# Patient Record
Sex: Female | Born: 1945 | Race: White | Hispanic: No | Marital: Single | Smoking: Never smoker
Health system: Southern US, Community
[De-identification: ages and names within clinical notes are randomized; demographics above are authoritative.]

## PROBLEM LIST (undated history)

## (undated) DIAGNOSIS — Z8042 Family history of malignant neoplasm of prostate: Secondary | ICD-10-CM

## (undated) DIAGNOSIS — F329 Major depressive disorder, single episode, unspecified: Secondary | ICD-10-CM

## (undated) DIAGNOSIS — Z8719 Personal history of other diseases of the digestive system: Secondary | ICD-10-CM

## (undated) DIAGNOSIS — E079 Disorder of thyroid, unspecified: Secondary | ICD-10-CM

## (undated) DIAGNOSIS — R9431 Abnormal electrocardiogram [ECG] [EKG]: Secondary | ICD-10-CM

## (undated) DIAGNOSIS — Z86010 Personal history of colonic polyps: Secondary | ICD-10-CM

## (undated) DIAGNOSIS — K862 Cyst of pancreas: Secondary | ICD-10-CM

## (undated) DIAGNOSIS — K219 Gastro-esophageal reflux disease without esophagitis: Secondary | ICD-10-CM

## (undated) DIAGNOSIS — I10 Essential (primary) hypertension: Secondary | ICD-10-CM

## (undated) DIAGNOSIS — C50919 Malignant neoplasm of unspecified site of unspecified female breast: Secondary | ICD-10-CM

## (undated) DIAGNOSIS — K5731 Diverticulosis of large intestine without perforation or abscess with bleeding: Secondary | ICD-10-CM

## (undated) DIAGNOSIS — F32A Depression, unspecified: Secondary | ICD-10-CM

## (undated) DIAGNOSIS — Z800 Family history of malignant neoplasm of digestive organs: Secondary | ICD-10-CM

## (undated) DIAGNOSIS — Z801 Family history of malignant neoplasm of trachea, bronchus and lung: Secondary | ICD-10-CM

## (undated) DIAGNOSIS — E039 Hypothyroidism, unspecified: Secondary | ICD-10-CM

## (undated) DIAGNOSIS — E7800 Pure hypercholesterolemia, unspecified: Secondary | ICD-10-CM

## (undated) HISTORY — DX: Family history of malignant neoplasm of digestive organs: Z80.0

## (undated) HISTORY — DX: Gastro-esophageal reflux disease without esophagitis: K21.9

## (undated) HISTORY — DX: Depression, unspecified: F32.A

## (undated) HISTORY — PX: OTHER SURGICAL HISTORY: SHX169

## (undated) HISTORY — DX: Diverticulosis of large intestine without perforation or abscess with bleeding: K57.31

## (undated) HISTORY — DX: Malignant neoplasm of unspecified site of unspecified female breast: C50.919

## (undated) HISTORY — DX: Cyst of pancreas: K86.2

## (undated) HISTORY — PX: LIVER BIOPSY: SHX301

## (undated) HISTORY — DX: Family history of malignant neoplasm of prostate: Z80.42

## (undated) HISTORY — DX: Family history of malignant neoplasm of trachea, bronchus and lung: Z80.1

## (undated) HISTORY — DX: Major depressive disorder, single episode, unspecified: F32.9

## (undated) HISTORY — DX: Personal history of colonic polyps: Z86.010

---

## 1980-12-29 HISTORY — PX: THYROIDECTOMY: SHX17

## 2000-12-29 HISTORY — PX: ABDOMINAL HYSTERECTOMY: SHX81

## 2014-12-29 DIAGNOSIS — K5731 Diverticulosis of large intestine without perforation or abscess with bleeding: Secondary | ICD-10-CM

## 2014-12-29 DIAGNOSIS — Z860100 Personal history of colon polyps, unspecified: Secondary | ICD-10-CM

## 2014-12-29 HISTORY — DX: Diverticulosis of large intestine without perforation or abscess with bleeding: K57.31

## 2014-12-29 HISTORY — DX: Personal history of colonic polyps: Z86.010

## 2014-12-29 HISTORY — DX: Personal history of colon polyps, unspecified: Z86.0100

## 2015-07-06 ENCOUNTER — Encounter (HOSPITAL_COMMUNITY): Payer: Self-pay | Admitting: Emergency Medicine

## 2015-07-06 ENCOUNTER — Emergency Department (HOSPITAL_COMMUNITY)
Admission: EM | Admit: 2015-07-06 | Discharge: 2015-07-06 | Disposition: A | Discharge status: Home / Self Care | Payer: No Typology Code available for payment source | Attending: Emergency Medicine | Admitting: Emergency Medicine

## 2015-07-06 DIAGNOSIS — I10 Essential (primary) hypertension: Secondary | ICD-10-CM | POA: Insufficient documentation

## 2015-07-06 DIAGNOSIS — Y998 Other external cause status: Secondary | ICD-10-CM | POA: Diagnosis not present

## 2015-07-06 DIAGNOSIS — Y92410 Unspecified street and highway as the place of occurrence of the external cause: Secondary | ICD-10-CM | POA: Diagnosis not present

## 2015-07-06 DIAGNOSIS — Z8639 Personal history of other endocrine, nutritional and metabolic disease: Secondary | ICD-10-CM | POA: Insufficient documentation

## 2015-07-06 DIAGNOSIS — S60519A Abrasion of unspecified hand, initial encounter: Secondary | ICD-10-CM | POA: Insufficient documentation

## 2015-07-06 DIAGNOSIS — Y9389 Activity, other specified: Secondary | ICD-10-CM | POA: Insufficient documentation

## 2015-07-06 DIAGNOSIS — T148XXA Other injury of unspecified body region, initial encounter: Secondary | ICD-10-CM

## 2015-07-06 DIAGNOSIS — S6990XA Unspecified injury of unspecified wrist, hand and finger(s), initial encounter: Secondary | ICD-10-CM | POA: Diagnosis present

## 2015-07-06 HISTORY — DX: Disorder of thyroid, unspecified: E07.9

## 2015-07-06 HISTORY — DX: Essential (primary) hypertension: I10

## 2015-07-06 HISTORY — DX: Pure hypercholesterolemia, unspecified: E78.00
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
## 2015-07-06 NOTE — ED Notes (Signed)
Given glass of water
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
## 2015-07-06 NOTE — ED Notes (Signed)
C-Collar removed by Dr. Reather Converse

## 2015-09-07 ENCOUNTER — Encounter: Payer: Self-pay | Admitting: Family Medicine

## 2015-09-07 ENCOUNTER — Ambulatory Visit (INDEPENDENT_AMBULATORY_CARE_PROVIDER_SITE_OTHER): Payer: Medicare Other | Admitting: Family Medicine

## 2015-09-07 VITALS — BP 153/84 | HR 87 | Temp 97.0°F | Ht 66.0 in | Wt 163.8 lb

## 2015-09-07 DIAGNOSIS — E042 Nontoxic multinodular goiter: Secondary | ICD-10-CM | POA: Diagnosis not present

## 2015-09-07 DIAGNOSIS — R079 Chest pain, unspecified: Secondary | ICD-10-CM | POA: Diagnosis not present

## 2015-09-07 DIAGNOSIS — I10 Essential (primary) hypertension: Secondary | ICD-10-CM | POA: Diagnosis not present

## 2015-09-07 DIAGNOSIS — E785 Hyperlipidemia, unspecified: Secondary | ICD-10-CM | POA: Diagnosis not present
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
## 2015-09-07 NOTE — Patient Instructions (Signed)
Great to meet you!  We will get you set up with Cardiology   Chest Pain (Nonspecific) It is often hard to give a specific diagnosis for the cause of chest pain. There is always a chance that your pain could be related to something serious, such as a heart attack or a blood clot in the lungs. You need to follow up with your health care provider for further evaluation. CAUSES   Heartburn.  Pneumonia or bronchitis.  Anxiety or stress.  Inflammation around your heart (pericarditis) or lung (pleuritis or pleurisy).  A blood clot in the lung.  A collapsed lung (pneumothorax). It can develop suddenly on its own (spontaneous pneumothorax) or from trauma to the chest.  Shingles infection (herpes zoster virus). The chest wall is composed of bones, muscles, and cartilage. Any of these can be the source of the pain.  The bones can be bruised by injury.  The muscles or cartilage can be strained by coughing or overwork.  The cartilage can be affected by inflammation and become sore (costochondritis). DIAGNOSIS  Lab tests or other studies may be needed to find the cause of your pain. Your health care provider may have you take a test called an ambulatory electrocardiogram (ECG). An ECG records your heartbeat patterns over a 24-hour period. You may also have other tests, such as:  Transthoracic echocardiogram (TTE). During echocardiography, sound waves are used to evaluate how blood flows through your heart.  Transesophageal echocardiogram (TEE).  Cardiac monitoring. This allows your health care provider to monitor your heart rate and rhythm in real time.  Holter monitor. This is a portable device that records your heartbeat and can help diagnose heart arrhythmias. It allows your health care provider to track your heart activity for several days, if needed.  Stress tests by exercise or by giving medicine that makes the heart beat faster. TREATMENT   Treatment depends on what may be causing  your chest pain. Treatment may include:  Acid blockers for heartburn.  Anti-inflammatory medicine.  Pain medicine for inflammatory conditions.  Antibiotics if an infection is present.  You may be advised to change lifestyle habits. This includes stopping smoking and avoiding alcohol, caffeine, and chocolate.  You may be advised to keep your head raised (elevated) when sleeping. This reduces the chance of acid going backward from your stomach into your esophagus. Most of the time, nonspecific chest pain will improve within 2-3 days with rest and mild pain medicine.  HOME CARE INSTRUCTIONS   If antibiotics were prescribed, take them as directed. Finish them even if you start to feel better.  For the next few days, avoid physical activities that bring on chest pain. Continue physical activities as directed.  Do not use any tobacco products, including cigarettes, chewing tobacco, or electronic cigarettes.  Avoid drinking alcohol.  Only take medicine as directed by your health care provider.  Follow your health care provider's suggestions for further testing if your chest pain does not go away.  Keep any follow-up appointments you made. If you do not go to an appointment, you could develop lasting (chronic) problems with pain. If there is any problem keeping an appointment, call to reschedule. SEEK MEDICAL CARE IF:   Your chest pain does not go away, even after treatment.  You have a rash with blisters on your chest.  You have a fever. SEEK IMMEDIATE MEDICAL CARE IF:   You have increased chest pain or pain that spreads to your arm, neck, jaw, back, or abdomen.  You have shortness of breath.  You have an increasing cough, or you cough up blood.  You have severe back or abdominal pain.  You feel nauseous or vomit.  You have severe weakness.  You faint.  You have chills. This is an emergency. Do not wait to see if the pain will go away. Get medical help at once. Call your  local emergency services (911 in [place] Do not drive yourself to the hospital. MAKE SURE YOU:   Understand these instructions.  Will watch your condition.  Will get help right away if you are not doing well or get worse. Document Released: 09/24/2005 Document Revised: 12/20/2013 Document Reviewed: 07/20/2008 [facility] Patient Information 2015 [place], [place]. This information is not intended to replace advice given to you by your health care provider. Make sure you discuss any questions you have with your health care provider.

## 2015-09-14 ENCOUNTER — Ambulatory Visit: Payer: Self-pay | Admitting: Family Medicine

## 2015-09-19 ENCOUNTER — Ambulatory Visit (INDEPENDENT_AMBULATORY_CARE_PROVIDER_SITE_OTHER): Payer: Medicare Other | Admitting: Cardiology

## 2015-09-19 ENCOUNTER — Encounter: Payer: Self-pay | Admitting: Cardiology

## 2015-09-19 VITALS — BP 170/94 | HR 84 | Ht 66.0 in | Wt 164.0 lb

## 2015-09-19 DIAGNOSIS — R072 Precordial pain: Secondary | ICD-10-CM

## 2015-09-19 MED ORDER — LISINOPRIL 10 MG PO TABS: 10.0000 mg | ORAL_TABLET | Freq: Every day | ORAL | Status: DC

## 2015-09-19 NOTE — Progress Notes (Signed)
Cardiology Office Note   Date:  09/19/2015   ID:  Christine Cantrell, DOB 05-06-1946, MRN [id number]  PCP:  Kenn File, MD  Cardiologist:   Minus Breeding, MD   Chief Complaint  Patient presents with  . Chest Pain      History of Present Illness: Christine Cantrell is a 69 y.o. female who presents for evaluation of chest discomfort.  She has a long history of a left bundle branch block but no other significant cardiac history. She reports a distant stress test. She recently moved up here from [place] and had to do a lot of work on her mother's house. Her mother is deceased in the hospital being rented. While she was working outside he did she developed some discomfort that was between her left breast and axilla. These were fleeting shooting electrical type discomfort. There were no associated symptoms. It would last for only a few seconds. She did not describe any substernal discomfort, neck or arm discomfort. These have since gone away. She's never had these before. She otherwise walks daily and otherwise works hard and cannot bring on the symptoms. She has no shortness of breath, PND or orthopnea. She's had no palpitations, presyncope or syncope. She's had no weight gain or edema.   Past Medical History  Diagnosis Date  . Hypertension   . High cholesterol   . Thyroid disease     hypothyroidism  . Depression   . GERD (gastroesophageal reflux disease)     Past Surgical History  Procedure Laterality Date  . Thyroidectomy  1982  . Abdominal hysterectomy  2002     Current Outpatient Prescriptions  Medication Sig Dispense Refill  . Coenzyme Q10 (COQ10) 200 MG CAPS Take 200 mg by mouth daily.    Marland Kitchen levothyroxine (SYNTHROID, LEVOTHROID) 88 MCG tablet Take 88 mcg by mouth daily before breakfast.    . lisinopril-hydrochlorothiazide (PRINZIDE,ZESTORETIC) 20-12.5 MG per tablet Take 1 tablet by mouth daily.    . Multiple Vitamin (MULTIVITAMIN) capsule Take 1 capsule by mouth daily.    .  Omega 3 1000 MG CAPS Take 1,000 mg by mouth daily.    Marland Kitchen omeprazole (PRILOSEC) 20 MG capsule Take 20 mg by mouth daily.    . simvastatin (ZOCOR) 20 MG tablet Take 20 mg by mouth daily.    Marland Kitchen lisinopril (PRINIVIL,ZESTRIL) 10 MG tablet Take 1 tablet (10 mg total) by mouth daily. Take 12 hrs after your AM dose 30 tablet 11   No current facility-administered medications for this visit.    Allergies:   Review of patient's allergies indicates no known allergies.    Social History:  The patient  reports that she has never smoked. She does not have any smokeless tobacco history on file. She reports that she drinks alcohol. She reports that she uses illicit drugs.   Family History:  The patient's family history includes High Cholesterol in her father and mother; Hypertension in her father and mother.    ROS:  Please see the history of present illness.   Otherwise, review of systems are positive for none.   All other systems are reviewed and negative.    PHYSICAL EXAM: VS:  BP 170/94 mmHg  Pulse 84  Ht 5\' 6"  (1.676 m)  Wt 164 lb (74.39 kg)  BMI 26.48 kg/m2 , BMI Body mass index is 26.48 kg/(m^2). GENERAL:  Well appearing HEENT:  Pupils equal round and reactive, fundi not visualized, oral mucosa unremarkable NECK:  No jugular venous distention, waveform within  normal limits, carotid upstroke brisk and symmetric, no bruits, no thyromegaly LYMPHATICS:  No cervical, inguinal adenopathy LUNGS:  Clear to auscultation bilaterally BACK:  No CVA tenderness CHEST:  Unremarkable HEART:  PMI not displaced or sustained,S1 and S2 within normal limits, no S3, no S4, no clicks, no rubs, no murmurs ABD:  Flat, positive bowel sounds normal in frequency in pitch, no bruits, no rebound, no guarding, no midline pulsatile mass, no hepatomegaly, no splenomegaly EXT:  2 plus pulses throughout, no edema, no cyanosis no clubbing SKIN:  No rashes no nodules NEURO:  Cranial nerves II through XII grossly intact, motor  grossly intact throughout PSYCH:  Cognitively intact, oriented to person place and time    EKG:  EKG is not ordered today. The ekg ordered 9/9demonstrates  normal sinus rhythm, rate 85, left bundle branch block    Recent Labs: No results found for requested labs within last 365 days.    Lipid Panel No results found for: CHOL, TRIG, HDL, CHOLHDL, VLDL, LDLCALC, LDLDIRECT    Wt Readings from Last 3 Encounters:  09/19/15 164 lb (74.39 kg)  09/07/15 163 lb 12.8 oz (74.299 kg)  07/06/15 164 lb (74.39 kg)      Other studies Reviewed: Additional studies/ records that were reviewed today include: EKG. Review of the above records demonstrates:  Please see elsewhere in the note.     ASSESSMENT AND PLAN:  ABNORMAL EKG:  The patient has a chronic left bundle branch block. No further cardiac workup is suggested.  CHEST PAIN:  This is very atypical. The pretest probability of obstructive coronary disease is very low. No further cardiovascular testing is suggested.  HTN:  Her blood pressure is elevated. I will add lisinopril 10 mg daily p.m. to her regimen and she will keep a blood pressure diary.   Current medicines are reviewed at length with the patient today.  The patient has concerns regarding medicines.  The following changes have been made:  As above  Labs/ tests ordered today include: None  No orders of the defined types were placed in this encounter.     Disposition:   FU with me as needed.     Signed, Minus Breeding, MD  09/19/2015 8:37 PM    [place]

## 2015-09-19 NOTE — Patient Instructions (Signed)
Medication Instructions:  Please take Lisinopril 10 mg 12 hours after your AM dose. Continue all other medications as listed.  Follow-Up: As needed with Dr Percival Spanish  Thank you for choosing [facility]!!

## 2015-12-11 ENCOUNTER — Ambulatory Visit (INDEPENDENT_AMBULATORY_CARE_PROVIDER_SITE_OTHER): Payer: Medicare Other | Admitting: Family Medicine

## 2015-12-11 ENCOUNTER — Encounter: Payer: Self-pay | Admitting: Family Medicine

## 2015-12-11 VITALS — BP 158/77 | HR 90 | Temp 97.0°F | Ht 66.0 in | Wt 163.8 lb

## 2015-12-11 DIAGNOSIS — E785 Hyperlipidemia, unspecified: Secondary | ICD-10-CM

## 2015-12-11 DIAGNOSIS — Z0000 Encounter for general adult medical examination without abnormal findings: Secondary | ICD-10-CM | POA: Insufficient documentation

## 2015-12-11 DIAGNOSIS — I10 Essential (primary) hypertension: Secondary | ICD-10-CM

## 2015-12-11 DIAGNOSIS — E042 Nontoxic multinodular goiter: Secondary | ICD-10-CM

## 2015-12-11 NOTE — Progress Notes (Addendum)
   HPI  Patient presents today to discuss hypertension and for three-month follow-up.  Hypertension No chest pain, palpitations, leg edema, headaches. Good medication compliance. Does not check at home.  Hyperlipidemia Watching her diet in general but difficult for the holidays. Medication compliance Does not go up on medication.  Thyroid History of multi thyroid nodules, she is using Synthroid to suppress the growth and keep her TSH down Her previous endocrinologist in [place] as a records which we have today.  Healthcare maintenance Gets annual mammograms, needs prescription but wants to calling to let us know where to set it up.   PMH: Smoking status noted ROS: Per HPI  Objective: BP 158/77 mmHg  Pulse 90  Temp(Src) 97 F (36.1 C) (Oral)  Ht 5\' 6"  (1.676 m)  Wt 163 lb 12.8 oz (74.299 kg)  BMI 26.45 kg/m2 Gen: NAD, alert, cooperative with exam HEENT: NCAT CV: RRR, good S1/S2, no murmur Resp: CTABL, no wheezes, non-labored Ext: No edema, warm Neuro: Alert and oriented, No gross deficits  Labs from 09/06/2015 Total cholesterol 233 HDL 78 LDL 139  WBC 4.1 Hemoglobin 15.8 Platelets 152.  Sodium 131 Glucose 126 Creatinine 0.72, GFR 85 Potassium 5.1 AST 29 ALT 32 Calcium 9.6  Assessment and plan:  # Hypertension Elevated today, however she states she always has a problem with whitecoat hypertension Keep a careful log, return in one month with average blood pressures over 140/90 Otherwise return in 3 months. She has good medication compliance. No red flags Labs as above  # Hyperlipidemia Her last LDL in September 2016 was 139, she's had no cardiac events in the vascular disease She's on simvastatin 20 mg, she would like to wait on changing this. Lipitor failure with myalgias in the past.   # Healthcare maintenance Mammogram She is also due for colonoscopy as well, however she has had this done this year and she will drop off records.  # Thyroid  nodule Monitored annually by her endocrinologist in [place], she will need this arranged next year here Previous ultrasound report report place to be scanned, they were all stable    Laroy Apple, MD [facility] 12/11/2015, 8:35 AM

## 2015-12-11 NOTE — Patient Instructions (Addendum)
Great to see you!  Call with where you would like to get a mammo and we will gladly help you arrange it.   Please bring in records of your colonoscopy and mammogram  Consider increasing your cholesterol medicine.    Lets keep a close eye on your blood pressure, Please keep a log. If your average blood pressure is greater than 140/90 plets see you back in 1 month.   If it is normal (less than 140/90) come back in 3 months

## 2015-12-12 ENCOUNTER — Telehealth: Payer: Self-pay | Admitting: Family Medicine

## 2015-12-13 NOTE — Telephone Encounter (Signed)
Pt advised of md feedback. Pt voiced understanding.

## 2015-12-13 NOTE — Telephone Encounter (Signed)
Ok to take vitamin D given age, can check next blood draw.   1000-2000  IU Vitamin D3/day  Laroy Apple, MD [facility] 12/13/2015, 7:44 AM

## 2016-01-04 ENCOUNTER — Other Ambulatory Visit: Payer: Self-pay | Admitting: *Deleted

## 2016-01-04 MED ORDER — LISINOPRIL 10 MG PO TABS: 10.0000 mg | ORAL_TABLET | Freq: Every day | ORAL | Status: DC

## 2016-02-11 DIAGNOSIS — H527 Unspecified disorder of refraction: Secondary | ICD-10-CM | POA: Diagnosis not present

## 2016-02-11 DIAGNOSIS — H43393 Other vitreous opacities, bilateral: Secondary | ICD-10-CM | POA: Diagnosis not present

## 2016-02-11 DIAGNOSIS — H25813 Combined forms of age-related cataract, bilateral: Secondary | ICD-10-CM | POA: Diagnosis not present

## 2016-02-12 DIAGNOSIS — L821 Other seborrheic keratosis: Secondary | ICD-10-CM | POA: Diagnosis not present

## 2016-02-12 DIAGNOSIS — L820 Inflamed seborrheic keratosis: Secondary | ICD-10-CM | POA: Diagnosis not present

## 2016-03-10 ENCOUNTER — Encounter: Payer: Self-pay | Admitting: Family Medicine

## 2016-03-10 ENCOUNTER — Ambulatory Visit (INDEPENDENT_AMBULATORY_CARE_PROVIDER_SITE_OTHER): Payer: Medicare Other | Admitting: Family Medicine

## 2016-03-10 ENCOUNTER — Encounter (INDEPENDENT_AMBULATORY_CARE_PROVIDER_SITE_OTHER): Payer: Self-pay

## 2016-03-10 VITALS — BP 138/74 | HR 92 | Temp 97.6°F | Ht 66.0 in | Wt 168.2 lb

## 2016-03-10 DIAGNOSIS — E042 Nontoxic multinodular goiter: Secondary | ICD-10-CM | POA: Diagnosis not present

## 2016-03-10 DIAGNOSIS — I10 Essential (primary) hypertension: Secondary | ICD-10-CM

## 2016-03-10 DIAGNOSIS — E785 Hyperlipidemia, unspecified: Secondary | ICD-10-CM | POA: Diagnosis not present

## 2016-03-10 DIAGNOSIS — Z7689 Persons encountering health services in other specified circumstances: Secondary | ICD-10-CM | POA: Diagnosis not present

## 2016-03-10 LAB — BAYER DCA HB A1C WAIVED: HB A1C: 5.5 % (ref <7.0)

## 2016-03-10 MED ORDER — LISINOPRIL 10 MG PO TABS: 10.0000 mg | ORAL_TABLET | Freq: Every day | ORAL | Status: DC

## 2016-03-10 MED ORDER — LEVOTHYROXINE SODIUM 88 MCG PO TABS: 88.0000 ug | ORAL_TABLET | Freq: Every day | ORAL | Status: DC

## 2016-03-10 MED ORDER — LISINOPRIL-HYDROCHLOROTHIAZIDE 20-12.5 MG PO TABS: 1.0000 | ORAL_TABLET | Freq: Every day | ORAL | Status: DC

## 2016-03-10 MED ORDER — SIMVASTATIN 20 MG PO TABS: 20.0000 mg | ORAL_TABLET | Freq: Every day | ORAL | Status: DC

## 2016-03-10 NOTE — Progress Notes (Signed)
   HPI  Patient presents today for hypertension, hyperlipidemia, and thyroid nodules.  Hypertension Doing well with her medications, she takes lisinopril, a second dose, in the afternoon. Her blood pressure at home for her record that she is brought in averages 120s to upper 130s over 09K to 95F diastolic. She denies chest pain, dyspnea, palpitations, leg edema.  Hyperlipidemia Watching diet Taking medication regularly, no myalgias  She walks 45 minutes daily He brings in several papers today documenting her colonoscopy, mammogram, and EGD  Has lab orders from her endocrinologist who manages her thyroid nodules and uses Synthroid for TSH suppression   PMH: Smoking status noted ROS: Per HPI  Objective: BP 138/74 mmHg  Pulse 92  Temp(Src) 97.6 F (36.4 C) (Oral)  Ht _0  (1.676 m)  Wt 168 lb 3.2 oz (76.295 kg)  BMI 27.16 kg/m2 Gen: NAD, alert, cooperative with exam HEENT: NCAT CV: RRR, good S1/S2, no murmur Resp: CTABL, no wheezes, non-labored Ext: No edema, warm Neuro: Alert and oriented, No gross deficits  Assessment and plan:  # hypertension Reasonably well controlled Continue current regimen of lisinopril/HCTZ, another dose of lisinopri lin the afternoon, total lisinopril dose is 30 mg Labs, nonfasting  # HLD Labs, Continue simva  # HCM  Records reviewed and placed to be scanned  # Thyroid nodules TSH, T3/T4 Changes per endo    Orders Placed This Encounter  Procedures  . CMP14+EGFR  . CBC  . Lipid Panel  . Bayer DCA Hb A1c Waived  . T3  . T4, free  . TSH    Meds ordered this encounter  Medications  . levothyroxine (SYNTHROID, LEVOTHROID) 88 MCG tablet    Sig: Take 1 tablet (88 mcg total) by mouth daily before breakfast.    Dispense:  90 tablet    Refill:  3  . simvastatin (ZOCOR) 20 MG tablet    Sig: Take 1 tablet (20 mg total) by mouth daily.    Dispense:  90 tablet    Refill:  3  . lisinopril (PRINIVIL,ZESTRIL) 10 MG tablet    Sig:  Take 1 tablet (10 mg total) by mouth daily. Take 12 hrs after your AM dose    Dispense:  90 tablet    Refill:  3  . lisinopril-hydrochlorothiazide (PRINZIDE,ZESTORETIC) 20-12.5 MG tablet    Sig: Take 1 tablet by mouth daily.    Dispense:  90 tablet    Refill:  [place], MD [place] 03/10/2016, 8:28 AM

## 2016-03-10 NOTE — Patient Instructions (Signed)
Great to see you!  I think your blood pressure looks great, lets keep up the routine you have.   Great job exercising!  Lets see you back in 6 months

## 2016-03-11 ENCOUNTER — Other Ambulatory Visit: Payer: Self-pay | Admitting: Family Medicine

## 2016-03-11 DIAGNOSIS — Z0000 Encounter for general adult medical examination without abnormal findings: Secondary | ICD-10-CM

## 2016-03-11 LAB — CMP14+EGFR
ALT: 30 IU/L (ref 0–32)
AST: 29 IU/L (ref 0–40)
Albumin/Globulin Ratio: 1.8 (ref 1.2–2.2)
Albumin: 4.3 g/dL (ref 3.5–4.8)
Alkaline Phosphatase: 28 IU/L — ABNORMAL LOW (ref 39–117)
BUN/Creatinine Ratio: 24 (ref 11–26)
BUN: 16 mg/dL (ref 8–27)
Bilirubin Total: 0.4 mg/dL (ref 0.0–1.2)
CALCIUM: 9.2 mg/dL (ref 8.7–10.3)
CO2: 23 mmol/L (ref 18–29)
CREATININE: 0.67 mg/dL (ref 0.57–1.00)
Chloride: 93 mmol/L — ABNORMAL LOW (ref 96–106)
GFR calc Af Amer: 102 mL/min/{1.73_m2} (ref >59)
GFR, EST NON AFRICAN AMERICAN: 88 mL/min/{1.73_m2} (ref >59)
GLOBULIN, TOTAL: 2.4 g/dL (ref 1.5–4.5)
Glucose: 117 mg/dL — ABNORMAL HIGH (ref 65–99)
Potassium: 3.8 mmol/L (ref 3.5–5.2)
SODIUM: 134 mmol/L (ref 134–144)
TOTAL PROTEIN: 6.7 g/dL (ref 6.0–8.5)

## 2016-03-11 LAB — CBC
HEMATOCRIT: 41 % (ref 34.0–46.6)
HEMOGLOBIN: 14.3 g/dL (ref 11.1–15.9)
MCH: 32.8 pg (ref 26.6–33.0)
MCHC: 34.9 g/dL (ref 31.5–35.7)
MCV: 94 fL (ref 79–97)
Platelets: 142 10*3/uL — ABNORMAL LOW (ref 150–379)
RBC: 4.36 x10E6/uL (ref 3.77–5.28)
RDW: 12.9 % (ref 12.3–15.4)
WBC: 4.6 10*3/uL (ref 3.4–10.8)

## 2016-03-11 LAB — LIPID PANEL
CHOL/HDL RATIO: 2.8 ratio (ref 0.0–4.4)
Cholesterol, Total: 213 mg/dL — ABNORMAL HIGH (ref 100–199)
HDL: 77 mg/dL (ref >39)
LDL Calculated: 108 mg/dL — ABNORMAL HIGH (ref 0–99)
Triglycerides: 141 mg/dL (ref 0–149)
VLDL CHOLESTEROL CAL: 28 mg/dL (ref 5–40)

## 2016-03-11 LAB — TSH: TSH: 1.08 u[IU]/mL (ref 0.450–4.500)

## 2016-03-11 LAB — T3: T3, Total: 73 ng/dL (ref 71–180)

## 2016-03-11 LAB — T4, FREE: Free T4: 1.29 ng/dL (ref 0.82–1.77)

## 2016-03-12 LAB — VITAMIN D 25 HYDROXY (VIT D DEFICIENCY, FRACTURES): Vit D, 25-Hydroxy: 31.3 ng/mL (ref 30.0–100.0)

## 2016-03-12 LAB — SPECIMEN STATUS REPORT

## 2016-04-16 DIAGNOSIS — Z1231 Encounter for screening mammogram for malignant neoplasm of breast: Secondary | ICD-10-CM | POA: Diagnosis not present

## 2016-04-16 LAB — HM MAMMOGRAPHY

## 2016-04-23 ENCOUNTER — Encounter: Payer: Self-pay | Admitting: *Deleted

## 2016-04-30 ENCOUNTER — Encounter: Payer: Self-pay | Admitting: *Deleted

## 2016-04-30 ENCOUNTER — Telehealth: Payer: Self-pay | Admitting: Family Medicine

## 2016-04-30 MED ORDER — LISINOPRIL-HYDROCHLOROTHIAZIDE 20-12.5 MG PO TABS: 1.0000 | ORAL_TABLET | Freq: Every day | ORAL | Status: DC

## 2016-04-30 NOTE — Addendum Note (Signed)
Addended by: Thana Ates on: 04/30/2016 10:48 AM   Modules accepted: Orders

## 2016-04-30 NOTE — Telephone Encounter (Signed)
Patient aware that [facility]

## 2016-06-18 ENCOUNTER — Other Ambulatory Visit: Payer: Self-pay | Admitting: Family Medicine

## 2016-08-21 ENCOUNTER — Telehealth: Payer: Self-pay | Admitting: Family Medicine

## 2016-08-21 DIAGNOSIS — I10 Essential (primary) hypertension: Secondary | ICD-10-CM

## 2016-08-21 DIAGNOSIS — E042 Nontoxic multinodular goiter: Secondary | ICD-10-CM

## 2016-08-22 NOTE — Telephone Encounter (Signed)
Pt aware.

## 2016-08-22 NOTE — Telephone Encounter (Signed)
Orders placed.   Laroy Apple, MD [facility] 08/22/2016, 12:44 PM

## 2016-08-26 ENCOUNTER — Other Ambulatory Visit: Payer: Medicare Other

## 2016-08-26 DIAGNOSIS — E042 Nontoxic multinodular goiter: Secondary | ICD-10-CM | POA: Diagnosis not present

## 2016-08-26 DIAGNOSIS — I10 Essential (primary) hypertension: Secondary | ICD-10-CM | POA: Diagnosis not present

## 2016-08-27 LAB — CMP14+EGFR
A/G RATIO: 2.1 (ref 1.2–2.2)
ALT: 30 IU/L (ref 0–32)
AST: 34 IU/L (ref 0–40)
Albumin: 4.6 g/dL (ref 3.5–4.8)
Alkaline Phosphatase: 24 IU/L — ABNORMAL LOW (ref 39–117)
BUN/Creatinine Ratio: 17 (ref 12–28)
BUN: 12 mg/dL (ref 8–27)
Bilirubin Total: 0.6 mg/dL (ref 0.0–1.2)
CALCIUM: 9.1 mg/dL (ref 8.7–10.3)
CO2: 24 mmol/L (ref 18–29)
CREATININE: 0.69 mg/dL (ref 0.57–1.00)
Chloride: 91 mmol/L — ABNORMAL LOW (ref 96–106)
GFR, EST AFRICAN AMERICAN: 101 mL/min/{1.73_m2} (ref >59)
GFR, EST NON AFRICAN AMERICAN: 87 mL/min/{1.73_m2} (ref >59)
GLUCOSE: 99 mg/dL (ref 65–99)
Globulin, Total: 2.2 g/dL (ref 1.5–4.5)
Potassium: 4.5 mmol/L (ref 3.5–5.2)
Sodium: 133 mmol/L — ABNORMAL LOW (ref 134–144)
TOTAL PROTEIN: 6.8 g/dL (ref 6.0–8.5)

## 2016-08-27 LAB — T4, FREE: Free T4: 1.4 ng/dL (ref 0.82–1.77)

## 2016-08-27 LAB — TSH: TSH: 0.58 u[IU]/mL (ref 0.450–4.500)

## 2016-08-27 LAB — T3, FREE: T3, Free: 2.6 pg/mL (ref 2.0–4.4)

## 2016-09-03 ENCOUNTER — Telehealth: Payer: Self-pay | Admitting: Family Medicine

## 2016-09-03 NOTE — Telephone Encounter (Signed)
Patient aware that cholesterol and A1c was not checked on her last visit. Patient states that at her old office they were frequently checked and that she would just discuss it with Dr.Bradshaw at her follow up next week. Patient had her labs mailed and wanted to make sure she got them all.

## 2016-09-11 ENCOUNTER — Ambulatory Visit (INDEPENDENT_AMBULATORY_CARE_PROVIDER_SITE_OTHER): Payer: Medicare Other | Admitting: Family Medicine

## 2016-09-11 ENCOUNTER — Encounter: Payer: Self-pay | Admitting: Family Medicine

## 2016-09-11 VITALS — BP 139/76 | HR 81 | Temp 98.1°F | Ht 66.0 in | Wt 167.4 lb

## 2016-09-11 DIAGNOSIS — E785 Hyperlipidemia, unspecified: Secondary | ICD-10-CM | POA: Diagnosis not present

## 2016-09-11 DIAGNOSIS — R7303 Prediabetes: Secondary | ICD-10-CM | POA: Diagnosis not present

## 2016-09-11 DIAGNOSIS — I10 Essential (primary) hypertension: Secondary | ICD-10-CM | POA: Diagnosis not present

## 2016-09-11 DIAGNOSIS — Z0000 Encounter for general adult medical examination without abnormal findings: Secondary | ICD-10-CM

## 2016-09-11 LAB — BAYER DCA HB A1C WAIVED: HB A1C (BAYER DCA - WAIVED): 5.5 % (ref <7.0)

## 2016-09-11 NOTE — Progress Notes (Signed)
   HPI  Patient presents today here for follow up for hypertension, hyperlipidemia, and history of prediabetes.  Hypertension Prinzide in a.m., additional lisinopril in the afternoon Measures 120s over 80s at most checks, does not check everyday.  Hyperlipidemia Patient would like lipids rechecked, she understands that once a year is adequate but would like to look again. She has had some recent weight loss and is proud of her progress.  She's been watching her diet well  Prediabetes Patient states that he she has a history of A1c as high as 6.5, the last A1c was better than expected and she would like to recheck this twice a year if possible.  Healthcare maintenance She would like to get a flu shot in the beginning of October, she feels it's too early now.   PMH: Smoking status noted ROS: Per HPI  Objective: BP 139/76   Pulse 81   Temp 98.1 F (36.7 C) (Oral)   Ht 5\' 6"  (1.676 m)   Wt 167 lb 6.4 oz (75.9 kg)   BMI 27.02 kg/m  Gen: NAD, alert, cooperative with exam HEENT: NCAT, EOMI, PERRL CV: RRR, good S1/S2, no murmur Resp: CTABL, no wheezes, non-labored Ext: No edema, warm Neuro: Alert and oriented, No gross deficits  Assessment and plan:  # Hypertension Well controlled Continue Prinzide plus additional lisinopril, total lisinopril doses 30 mg   # Hyperlipidemia Rechecking lipid panel, LDL goal less than 100, last LDL was 100 feet Continue simvastatin 20 mg  # Prediabetes Reported A1c in the past was explained 5, this is technically diabetic range, however she is diet-controlled currently   # Healthcare maintenance She reports pneumonia shot 2 in 2015, will return for flu shot Hepatitis C antibody drawn     Orders Placed This Encounter  Procedures  . Lipid panel  . Bayer DCA Hb A1c Waived  . Hepatitis C antibody    Laroy Apple, MD [facility] 09/11/2016, 8:56 AM

## 2016-09-11 NOTE — Patient Instructions (Signed)
Great to see you!  We will call with your labs within 1 week  Come back for the flu shot in early October like you are planning.

## 2016-09-12 LAB — LIPID PANEL
CHOLESTEROL TOTAL: 238 mg/dL — AB (ref 100–199)
Chol/HDL Ratio: 2.9 ratio units (ref 0.0–4.4)
HDL: 81 mg/dL (ref >39)
LDL Calculated: 113 mg/dL — ABNORMAL HIGH (ref 0–99)
Triglycerides: 218 mg/dL — ABNORMAL HIGH (ref 0–149)
VLDL CHOLESTEROL CAL: 44 mg/dL — AB (ref 5–40)

## 2016-09-12 LAB — HEPATITIS C ANTIBODY

## 2016-09-16 ENCOUNTER — Telehealth: Payer: Self-pay | Admitting: Family Medicine

## 2016-09-16 ENCOUNTER — Other Ambulatory Visit: Payer: Self-pay | Admitting: *Deleted

## 2016-09-16 MED ORDER — OMEPRAZOLE 20 MG PO CPDR: 20.0000 mg | DELAYED_RELEASE_CAPSULE | Freq: Every day | ORAL | 3 refills | Status: DC

## 2016-09-16 MED ORDER — SIMVASTATIN 40 MG PO TABS: 40.0000 mg | ORAL_TABLET | Freq: Every day | ORAL | 3 refills | Status: DC

## 2016-09-16 NOTE — Telephone Encounter (Signed)
I have printed the Rx's to be sent.   I have sent the increased dose of simvastatin.   Laroy Apple, MD [facility] 09/16/2016, 10:37 AM

## 2016-09-16 NOTE — Telephone Encounter (Signed)
Tried to contact patient and no answer.  

## 2016-09-16 NOTE — Telephone Encounter (Signed)
lmtcb

## 2016-09-16 NOTE — Telephone Encounter (Signed)
Medications sent to [facility] 

## 2016-09-17 ENCOUNTER — Other Ambulatory Visit: Payer: Self-pay | Admitting: Cardiology

## 2016-09-22 ENCOUNTER — Telehealth: Payer: Self-pay | Admitting: Family Medicine

## 2016-09-22 NOTE — Telephone Encounter (Signed)
Pt had at [facility]

## 2016-10-16 ENCOUNTER — Telehealth: Payer: Self-pay | Admitting: Family Medicine

## 2016-10-16 NOTE — Telephone Encounter (Signed)
Advised pt she needs to see the Endocrinologist and they will evaluate her and decide the course of treatment. Pt voiced understanding.

## 2016-10-17 ENCOUNTER — Other Ambulatory Visit: Payer: Self-pay

## 2016-10-17 MED ORDER — LISINOPRIL 10 MG PO TABS: 10.0000 mg | ORAL_TABLET | Freq: Every day | ORAL | 0 refills | Status: DC

## 2016-10-29 NOTE — Telephone Encounter (Signed)
Tried to contact patient several times and no answer or call back. This encounter will now be closed.

## 2016-11-04 DIAGNOSIS — Z0000 Encounter for general adult medical examination without abnormal findings: Secondary | ICD-10-CM | POA: Diagnosis not present

## 2016-11-05 ENCOUNTER — Ambulatory Visit (INDEPENDENT_AMBULATORY_CARE_PROVIDER_SITE_OTHER): Payer: Medicare Other | Admitting: Endocrinology

## 2016-11-05 ENCOUNTER — Encounter: Payer: Self-pay | Admitting: Endocrinology

## 2016-11-05 VITALS — BP 148/80 | HR 117 | Ht 66.0 in | Wt 168.0 lb

## 2016-11-05 DIAGNOSIS — E042 Nontoxic multinodular goiter: Secondary | ICD-10-CM | POA: Diagnosis not present

## 2016-11-05 NOTE — Patient Instructions (Addendum)
Let's recheck the ultrasound.  you will receive a phone call, about a day and time for an appointment.   Please continue the same medication.   You fast heart rate is not due to the thyroid, so you should have this followed up with Dr Laurance Flatten.   Please return here in 1 year.

## 2016-11-05 NOTE — Progress Notes (Signed)
Subjective:    Patient ID: Christine Cantrell, female    DOB: 1946/08/27, 70 y.o.   MRN: [id number]  HPI Pt had a partial thyroidectomy done in the 1980's.  She has been on synthroid since then.  she has no h/o XRT or surgery to the neck.  In 2016, she was noted to have slight swelling at the anterior neck, but no assoc pain.   Past Medical History:  Diagnosis Date  . Depression   . GERD (gastroesophageal reflux disease)   . High cholesterol   . Hypertension   . Thyroid disease    hypothyroidism    Past Surgical History:  Procedure Laterality Date  . ABDOMINAL HYSTERECTOMY  2002  . THYROIDECTOMY  1982    Social History   Social History  . Marital status: Single    Spouse name: N/A  . Number of children: N/A  . Years of education: N/A   Occupational History  . Not on file.   Social History Main Topics  . Smoking status: Never Smoker  . Smokeless tobacco: Not on file  . Alcohol use Yes     Comment: occasionally  . Drug use:   . Sexual activity: Yes    Birth control/ protection: Surgical   Other Topics Concern  . Not on file   Social History Narrative   Lives alone.      Current Outpatient Prescriptions on File Prior to Visit  Medication Sig Dispense Refill  . Coenzyme Q10 (COQ10) 200 MG CAPS Take 200 mg by mouth daily.    Marland Kitchen levothyroxine (SYNTHROID, LEVOTHROID) 88 MCG tablet Take 1 tablet (88 mcg total) by mouth daily before breakfast. 90 tablet 3  . lisinopril (PRINIVIL,ZESTRIL) 10 MG tablet Take 1 tablet (10 mg total) by mouth daily. PLEASE CONTACT OFFICE FOR ADDITIONAL REFILLS FINAL ATTEMPT 90 tablet 0  . lisinopril-hydrochlorothiazide (PRINZIDE,ZESTORETIC) 20-12.5 MG tablet Take 1 tablet by mouth  daily 90 tablet 0  . Multiple Vitamin (MULTIVITAMIN) capsule Take 1 capsule by mouth daily.    . Omega 3 1000 MG CAPS Take 1,000 mg by mouth daily.    Marland Kitchen omeprazole (PRILOSEC) 20 MG capsule Take 1 capsule (20 mg total) by mouth daily. 90 capsule 3  . simvastatin  (ZOCOR) 40 MG tablet Take 1 tablet (40 mg total) by mouth daily. 90 tablet 3   No current facility-administered medications on file prior to visit.     No Known Allergies  Family History  Problem Relation Age of Onset  . High Cholesterol Mother   . Hypertension Mother   . Goiter Mother   . High Cholesterol Father   . Hypertension Father   . Goiter Sister   . Goiter Brother     BP (!) 148/80   Pulse (!) 117   Ht 5\' 6"  (1.676 m)   Wt 168 lb (76.2 kg)   SpO2 97%   BMI 27.12 kg/m    Review of Systems Denies hoarseness, visual loss, chest pain, sob, dysphagia, skin rash, depression, cold intolerance, headache, numbness, and rhinorrhea.  She has lost weight.  She has easy bruising.     Objective:   Physical Exam VS: see vs page GEN: no distress HEAD: head: no deformity eyes: no periorbital swelling, no proptosis external nose and ears are normal mouth: no lesion seen NECK: Neck: a healed scar is present.  2 thyroid nodules are easily palpable--1 on the left, and 1 on the right CHEST WALL: no deformity LUNGS: clear to auscultation CV: tachycardia  rate, but reg rhythm, no murmur ABD: abdomen is soft, nontender.  no hepatosplenomegaly.  not distended.  no hernia MUSCULOSKELETAL: muscle bulk and strength are grossly normal.  no obvious joint swelling.  gait is normal and steady EXTEMITIES: no deformity.  no edema PULSES: no carotid bruit NEURO:  cn 2-12 grossly intact.   readily moves all 4's.  sensation is intact to touch on all 4's.  Slight tremor of the hands SKIN:  Normal texture and temperature.  No rash or suspicious lesion is visible.   NODES:  None palpable at the neck.   PSYCH: alert, well-oriented.  Does not appear anxious nor depressed.     [place] (2016): no change since earlier in 2016.   Lab Results  Component Value Date   TSH 0.580 08/26/2016   T3TOTAL 73 03/10/2016   I have reviewed outside records, and summarized: Pt was noted to have multinodular goiter,  and referred here.  Other problems were addressed: hyperglycemia, HTN, and dyslipidemia.      Assessment & Plan:  Multinodular goiter, euthyroid: due for recheck.  Tachycardia, new to me: in view of normal TSH, this is not due to the thyroid.   Patient is advised the following: Patient Instructions  Let's recheck the ultrasound.  you will receive a phone call, about a day and time for an appointment.   Please continue the same medication.   You fast heart rate is not due to the thyroid, so you should have this followed up with Dr Laurance Flatten.   Please return here in 1 year.

## 2016-11-11 ENCOUNTER — Ambulatory Visit
Admission: RE | Admit: 2016-11-11 | Discharge: 2016-11-11 | Disposition: A | Discharge status: Home / Self Care | Payer: Medicare Other | Source: Ambulatory Visit | Attending: Endocrinology | Admitting: Endocrinology

## 2016-11-11 DIAGNOSIS — E042 Nontoxic multinodular goiter: Secondary | ICD-10-CM | POA: Diagnosis not present

## 2016-11-12 DIAGNOSIS — M25561 Pain in right knee: Secondary | ICD-10-CM | POA: Diagnosis not present

## 2016-11-12 DIAGNOSIS — M25562 Pain in left knee: Secondary | ICD-10-CM | POA: Diagnosis not present

## 2016-11-13 ENCOUNTER — Telehealth: Payer: Self-pay

## 2016-11-13 NOTE — Telephone Encounter (Signed)
I called pt.  We discussed.  Given that you have had a benign bx in the past, partial thyroidectomy in the past, and no change on [place], no bx is needed now.  Please return in 1 year.

## 2016-11-13 NOTE — Telephone Encounter (Signed)
I contacted the patient and left a voicemail advising of this result. Requested a call back from the patient to discuss how she would like to proceed.

## 2016-11-13 NOTE — Telephone Encounter (Signed)
-----   Message from Renato Shin, MD sent at 11/12/2016 11:08 AM EST ----- please call patient: The ultrasound is not significantly changed.  That is good. However, the ultrasound recommends that the 2 nodules have a biopsy. If you have never had this done, you should. We can do here at the office.  It is easy. OK with you?

## 2016-11-13 NOTE — Telephone Encounter (Signed)
Patient called back and wanted to know if you would be willing to call and speak with her about the biopsy. She stated to me in the past she had a bad experience with a thyroid biopsy and would like to speak with you first about it. Patient requested you call after 5 pm if possible.

## 2016-11-14 ENCOUNTER — Telehealth: Payer: Self-pay | Admitting: Endocrinology

## 2016-11-14 NOTE — Telephone Encounter (Signed)
See message and please advise, Thanks!  

## 2016-11-14 NOTE — Telephone Encounter (Signed)
Ok, we can change our records, but just to verify 1 more time--is it 100 mcg/d?

## 2016-11-14 NOTE — Telephone Encounter (Signed)
Pt is taking 88 of synthryoid Dr. Loanne Drilling asked why it was reduced form 100 should the pt be on 100

## 2016-11-14 NOTE — Telephone Encounter (Signed)
Requested a call back from the patient to further discuss.  

## 2016-11-17 NOTE — Telephone Encounter (Signed)
Pt returned call, she received message. Pt is going to drop off a copy of her biopsy to the office for the provider.

## 2016-11-17 NOTE — Telephone Encounter (Signed)
See message to be advised.  

## 2016-11-24 ENCOUNTER — Telehealth: Payer: Self-pay | Admitting: Endocrinology

## 2016-11-24 NOTE — Telephone Encounter (Signed)
Seed her a copy of her sonagraham

## 2016-11-26 NOTE — Telephone Encounter (Signed)
Letter mailed

## 2016-11-26 NOTE — Telephone Encounter (Signed)
See message, can we send this ?

## 2016-11-26 NOTE — Telephone Encounter (Signed)
I printed  

## 2016-12-12 NOTE — Telephone Encounter (Signed)
I said in message that if you have never had bx, you should have done. However, pt then she said she had had bx.  given that you have had a benign bx in the past, partial thyroidectomy in the past, and no change on [place], no bx is needed now.  Please return in 1 year.

## 2016-12-12 NOTE — Telephone Encounter (Signed)
Christine Cantrell could you call this patient on Monday for me since I will not be in the office?

## 2016-12-12 NOTE — Telephone Encounter (Signed)
See message and please advise, Thanks!  

## 2016-12-12 NOTE — Telephone Encounter (Signed)
Pt thought she was to set up an appt for a biopsy but in the notes it says Dr. Loanne Drilling didn't think she needed one. Can you please give me a verification on what we are to do for the pt.

## 2016-12-15 NOTE — Telephone Encounter (Signed)
LM for pt to contact us back so I can relay Dr. Cordelia Pen message to her.

## 2016-12-15 NOTE — Telephone Encounter (Signed)
Advised Pt of Dr. Cordelia Pen message, no further questions at this time.

## 2016-12-26 DIAGNOSIS — M25561 Pain in right knee: Secondary | ICD-10-CM | POA: Diagnosis not present

## 2016-12-26 DIAGNOSIS — M1711 Unilateral primary osteoarthritis, right knee: Secondary | ICD-10-CM | POA: Diagnosis not present

## 2017-01-03 DIAGNOSIS — M25561 Pain in right knee: Secondary | ICD-10-CM | POA: Diagnosis not present

## 2017-01-07 DIAGNOSIS — M25561 Pain in right knee: Secondary | ICD-10-CM | POA: Diagnosis not present

## 2017-01-07 DIAGNOSIS — M1711 Unilateral primary osteoarthritis, right knee: Secondary | ICD-10-CM | POA: Diagnosis not present

## 2017-01-19 DIAGNOSIS — L820 Inflamed seborrheic keratosis: Secondary | ICD-10-CM | POA: Diagnosis not present

## 2017-01-19 DIAGNOSIS — L570 Actinic keratosis: Secondary | ICD-10-CM | POA: Diagnosis not present

## 2017-01-19 DIAGNOSIS — L219 Seborrheic dermatitis, unspecified: Secondary | ICD-10-CM | POA: Diagnosis not present

## 2017-01-30 ENCOUNTER — Telehealth: Payer: Self-pay | Admitting: Family Medicine

## 2017-01-30 DIAGNOSIS — E785 Hyperlipidemia, unspecified: Secondary | ICD-10-CM

## 2017-01-30 DIAGNOSIS — I10 Essential (primary) hypertension: Secondary | ICD-10-CM

## 2017-01-30 NOTE — Telephone Encounter (Signed)
Orders written  Laroy Apple, MD [facility] 01/30/2017, 5:35 PM

## 2017-01-30 NOTE — Telephone Encounter (Signed)
Pt aware orders are in.

## 2017-03-06 ENCOUNTER — Other Ambulatory Visit (INDEPENDENT_AMBULATORY_CARE_PROVIDER_SITE_OTHER): Payer: Medicare Other

## 2017-03-06 DIAGNOSIS — E785 Hyperlipidemia, unspecified: Secondary | ICD-10-CM | POA: Diagnosis not present

## 2017-03-06 DIAGNOSIS — I10 Essential (primary) hypertension: Secondary | ICD-10-CM | POA: Diagnosis not present

## 2017-03-07 LAB — CBC WITH DIFFERENTIAL/PLATELET
BASOS ABS: 0 10*3/uL (ref 0.0–0.2)
Basos: 1 %
EOS (ABSOLUTE): 0.1 10*3/uL (ref 0.0–0.4)
Eos: 3 %
Hematocrit: 41.2 % (ref 34.0–46.6)
Hemoglobin: 14.1 g/dL (ref 11.1–15.9)
Immature Grans (Abs): 0 10*3/uL (ref 0.0–0.1)
Immature Granulocytes: 0 %
LYMPHS ABS: 0.9 10*3/uL (ref 0.7–3.1)
Lymphs: 23 %
MCH: 33.6 pg — AB (ref 26.6–33.0)
MCHC: 34.2 g/dL (ref 31.5–35.7)
MCV: 98 fL — ABNORMAL HIGH (ref 79–97)
MONOCYTES: 10 %
Monocytes Absolute: 0.4 10*3/uL (ref 0.1–0.9)
NEUTROS ABS: 2.6 10*3/uL (ref 1.4–7.0)
Neutrophils: 63 %
PLATELETS: 122 10*3/uL — AB (ref 150–379)
RBC: 4.2 x10E6/uL (ref 3.77–5.28)
RDW: 12.9 % (ref 12.3–15.4)
WBC: 4 10*3/uL (ref 3.4–10.8)

## 2017-03-07 LAB — CMP14+EGFR
ALT: 31 IU/L (ref 0–32)
AST: 31 IU/L (ref 0–40)
Albumin/Globulin Ratio: 1.8 (ref 1.2–2.2)
Albumin: 4.4 g/dL (ref 3.5–4.8)
Alkaline Phosphatase: 25 IU/L — ABNORMAL LOW (ref 39–117)
BILIRUBIN TOTAL: 0.6 mg/dL (ref 0.0–1.2)
BUN / CREAT RATIO: 24 (ref 12–28)
BUN: 16 mg/dL (ref 8–27)
CHLORIDE: 97 mmol/L (ref 96–106)
CO2: 26 mmol/L (ref 18–29)
Calcium: 9.3 mg/dL (ref 8.7–10.3)
Creatinine, Ser: 0.67 mg/dL (ref 0.57–1.00)
GFR calc non Af Amer: 87 mL/min/{1.73_m2} (ref >59)
GFR, EST AFRICAN AMERICAN: 101 mL/min/{1.73_m2} (ref >59)
GLUCOSE: 116 mg/dL — AB (ref 65–99)
Globulin, Total: 2.5 g/dL (ref 1.5–4.5)
POTASSIUM: 4.5 mmol/L (ref 3.5–5.2)
Sodium: 140 mmol/L (ref 134–144)
TOTAL PROTEIN: 6.9 g/dL (ref 6.0–8.5)

## 2017-03-07 LAB — LIPID PANEL
CHOLESTEROL TOTAL: 228 mg/dL — AB (ref 100–199)
Chol/HDL Ratio: 2.7 ratio units (ref 0.0–4.4)
HDL: 85 mg/dL (ref >39)
LDL Calculated: 105 mg/dL — ABNORMAL HIGH (ref 0–99)
TRIGLYCERIDES: 188 mg/dL — AB (ref 0–149)
VLDL Cholesterol Cal: 38 mg/dL (ref 5–40)

## 2017-03-07 LAB — TSH: TSH: 0.832 u[IU]/mL (ref 0.450–4.500)

## 2017-03-10 ENCOUNTER — Other Ambulatory Visit: Payer: Self-pay | Admitting: Family Medicine

## 2017-03-12 ENCOUNTER — Ambulatory Visit (INDEPENDENT_AMBULATORY_CARE_PROVIDER_SITE_OTHER): Payer: Medicare Other | Admitting: Family Medicine

## 2017-03-12 VITALS — BP 128/75 | HR 88 | Temp 97.9°F | Ht 66.0 in | Wt 166.6 lb

## 2017-03-12 DIAGNOSIS — I10 Essential (primary) hypertension: Secondary | ICD-10-CM

## 2017-03-12 DIAGNOSIS — E042 Nontoxic multinodular goiter: Secondary | ICD-10-CM

## 2017-03-12 DIAGNOSIS — E785 Hyperlipidemia, unspecified: Secondary | ICD-10-CM | POA: Diagnosis not present

## 2017-03-12 MED ORDER — LISINOPRIL 10 MG PO TABS: 10.0000 mg | ORAL_TABLET | Freq: Every day | ORAL | 3 refills | Status: DC

## 2017-03-12 NOTE — Patient Instructions (Signed)
Great to see you!  Come back in 6 months unless you need us sooner.    

## 2017-03-12 NOTE — Progress Notes (Signed)
   HPI  Patient presents today here to follow-up for chronic medical conditions.  Patient's doing well overall and has no complaints.  She does have some concerns about a recent new story that she read about with memory loss and statins. She would like to continue her current statin.  Patient is taking vitamins daily.  She fallen last few months that caused an anterior cruciate ligament tear of her right knee, she does not want to pursue this surgically and is feeling much better.  She's walking easily without any concerns for falling.  PMH: Smoking status noted ROS: Per HPI  Objective: BP 128/75   Pulse 88   Temp 97.9 F (36.6 C) (Oral)   Ht 5\' 6"  (1.676 m)   Wt 166 lb 9.6 oz (75.6 kg)   BMI 26.89 kg/m  Gen: NAD, alert, cooperative with exam HEENT: NCAT, EOMI, PERRL CV: RRR, good S1/S2, no murmur Resp: CTABL, no wheezes, non-labored Abd: SNTND, BS present, no guarding or organomegaly Ext: No edema, warm Neuro: Alert and oriented, strength 5/5 and sensation intact in bilateral lower extremities  Assessment and plan:  # HTN Well-controlled on lisinopril plus Prinzide Total dose of lisinopril is 30 mg  # HLD Stable, reasonably well controlled, no changes Reviewed labs  Multiple thryroid nodules Suppression with synthroid, TSH WNL Nodules followed by endocrine- appreciate reccs and management  Meds ordered this encounter  Medications  . lisinopril (PRINIVIL,ZESTRIL) 10 MG tablet    Sig: Take 1 tablet (10 mg total) by mouth daily. PLEASE CONTACT OFFICE FOR ADDITIONAL REFILLS FINAL ATTEMPT    Dispense:  90 tablet    Refill:  [place], MD [place] 03/12/2017, 9:24 AM

## 2017-04-17 DIAGNOSIS — Z1231 Encounter for screening mammogram for malignant neoplasm of breast: Secondary | ICD-10-CM | POA: Diagnosis not present

## 2017-05-23 ENCOUNTER — Other Ambulatory Visit: Payer: Self-pay | Admitting: Family Medicine

## 2017-07-27 ENCOUNTER — Other Ambulatory Visit: Payer: Self-pay | Admitting: Family Medicine

## 2017-07-29 ENCOUNTER — Telehealth: Payer: Self-pay | Admitting: Family Medicine

## 2017-07-29 DIAGNOSIS — I10 Essential (primary) hypertension: Secondary | ICD-10-CM

## 2017-07-29 DIAGNOSIS — E785 Hyperlipidemia, unspecified: Secondary | ICD-10-CM

## 2017-07-29 NOTE — Telephone Encounter (Signed)
Please review and advise.

## 2017-07-31 ENCOUNTER — Other Ambulatory Visit: Payer: Self-pay | Admitting: *Deleted

## 2017-07-31 NOTE — Telephone Encounter (Signed)
Orders placed.   Laroy Apple, MD [facility] 07/31/2017, 12:53 PM

## 2017-07-31 NOTE — Telephone Encounter (Signed)
Aware, per message left on phone.

## 2017-09-01 ENCOUNTER — Other Ambulatory Visit: Payer: Self-pay | Admitting: Family Medicine

## 2017-09-03 DIAGNOSIS — Z23 Encounter for immunization: Secondary | ICD-10-CM | POA: Diagnosis not present

## 2017-09-08 ENCOUNTER — Encounter (INDEPENDENT_AMBULATORY_CARE_PROVIDER_SITE_OTHER): Payer: Self-pay

## 2017-09-08 ENCOUNTER — Other Ambulatory Visit: Payer: Medicare Other

## 2017-09-08 DIAGNOSIS — I10 Essential (primary) hypertension: Secondary | ICD-10-CM

## 2017-09-08 DIAGNOSIS — E785 Hyperlipidemia, unspecified: Secondary | ICD-10-CM

## 2017-09-09 LAB — CBC WITH DIFFERENTIAL/PLATELET
BASOS ABS: 0 10*3/uL (ref 0.0–0.2)
BASOS: 1 %
EOS (ABSOLUTE): 0.1 10*3/uL (ref 0.0–0.4)
Eos: 4 %
HEMOGLOBIN: 14 g/dL (ref 11.1–15.9)
Hematocrit: 41.4 % (ref 34.0–46.6)
IMMATURE GRANS (ABS): 0 10*3/uL (ref 0.0–0.1)
IMMATURE GRANULOCYTES: 0 %
LYMPHS: 16 %
Lymphocytes Absolute: 0.6 10*3/uL — ABNORMAL LOW (ref 0.7–3.1)
MCH: 32.4 pg (ref 26.6–33.0)
MCHC: 33.8 g/dL (ref 31.5–35.7)
MCV: 96 fL (ref 79–97)
MONOCYTES: 18 %
Monocytes Absolute: 0.6 10*3/uL (ref 0.1–0.9)
NEUTROS PCT: 61 %
Neutrophils Absolute: 2.1 10*3/uL (ref 1.4–7.0)
PLATELETS: 147 10*3/uL — AB (ref 150–379)
RBC: 4.32 x10E6/uL (ref 3.77–5.28)
RDW: 12.8 % (ref 12.3–15.4)
WBC: 3.5 10*3/uL (ref 3.4–10.8)

## 2017-09-09 LAB — CMP14+EGFR
ALBUMIN: 4.2 g/dL (ref 3.5–4.8)
ALK PHOS: 29 IU/L — AB (ref 39–117)
ALT: 24 IU/L (ref 0–32)
AST: 26 IU/L (ref 0–40)
Albumin/Globulin Ratio: 1.6 (ref 1.2–2.2)
BUN / CREAT RATIO: 14 (ref 12–28)
BUN: 9 mg/dL (ref 8–27)
Bilirubin Total: 0.7 mg/dL (ref 0.0–1.2)
CO2: 23 mmol/L (ref 20–29)
CREATININE: 0.64 mg/dL (ref 0.57–1.00)
Calcium: 9.3 mg/dL (ref 8.7–10.3)
Chloride: 90 mmol/L — ABNORMAL LOW (ref 96–106)
GFR calc Af Amer: 102 mL/min/{1.73_m2} (ref >59)
GFR calc non Af Amer: 89 mL/min/{1.73_m2} (ref >59)
GLUCOSE: 116 mg/dL — AB (ref 65–99)
Globulin, Total: 2.6 g/dL (ref 1.5–4.5)
Potassium: 4.5 mmol/L (ref 3.5–5.2)
Sodium: 129 mmol/L — ABNORMAL LOW (ref 134–144)
TOTAL PROTEIN: 6.8 g/dL (ref 6.0–8.5)

## 2017-09-09 LAB — LIPID PANEL
CHOL/HDL RATIO: 3 ratio (ref 0.0–4.4)
Cholesterol, Total: 212 mg/dL — ABNORMAL HIGH (ref 100–199)
HDL: 71 mg/dL (ref >39)
LDL CALC: 109 mg/dL — AB (ref 0–99)
TRIGLYCERIDES: 158 mg/dL — AB (ref 0–149)
VLDL Cholesterol Cal: 32 mg/dL (ref 5–40)

## 2017-09-09 LAB — TSH: TSH: 0.246 u[IU]/mL — AB (ref 0.450–4.500)

## 2017-09-15 ENCOUNTER — Encounter: Payer: Self-pay | Admitting: Family Medicine

## 2017-09-15 ENCOUNTER — Telehealth: Payer: Self-pay | Admitting: Endocrinology

## 2017-09-15 ENCOUNTER — Telehealth: Payer: Self-pay | Admitting: Family Medicine

## 2017-09-15 ENCOUNTER — Ambulatory Visit (INDEPENDENT_AMBULATORY_CARE_PROVIDER_SITE_OTHER): Payer: Medicare Other | Admitting: Family Medicine

## 2017-09-15 VITALS — BP 125/71 | HR 112 | Temp 96.7°F | Ht 66.0 in | Wt 167.2 lb

## 2017-09-15 DIAGNOSIS — I10 Essential (primary) hypertension: Secondary | ICD-10-CM | POA: Diagnosis not present

## 2017-09-15 DIAGNOSIS — E785 Hyperlipidemia, unspecified: Secondary | ICD-10-CM

## 2017-09-15 DIAGNOSIS — R7301 Impaired fasting glucose: Secondary | ICD-10-CM

## 2017-09-15 DIAGNOSIS — E042 Nontoxic multinodular goiter: Secondary | ICD-10-CM | POA: Diagnosis not present

## 2017-09-15 LAB — BAYER DCA HB A1C WAIVED: HB A1C (BAYER DCA - WAIVED): 5.6 % (ref <7.0)

## 2017-09-15 MED ORDER — LEVOTHYROXINE SODIUM 75 MCG PO TABS: 75.0000 ug | ORAL_TABLET | Freq: Every day | ORAL | 1 refills | Status: DC

## 2017-09-15 NOTE — Telephone Encounter (Signed)
Synthroid 75 sent to pharmacy

## 2017-09-15 NOTE — Telephone Encounter (Signed)
Please come in for ov as scheduled.  We'll address need for [place] then

## 2017-09-15 NOTE — Telephone Encounter (Signed)
Patient calling to follow up for 1 year f/u. States she had to have an ultrasound last year, wanted to see what she needed to do moving forward. Unsure as to whether to schedule with provider or have an order put in first. Please advise and call patient.

## 2017-09-15 NOTE — Telephone Encounter (Signed)
Patient has refused to make an appointment, she stated that she has being getting a [place] every year, and there is no need to make and appointment. Please call her.t

## 2017-09-15 NOTE — Telephone Encounter (Signed)
Routing to you °

## 2017-09-15 NOTE — Progress Notes (Signed)
   HPI  Patient presents today for follow-up of chronic medical conditions.  Patient has history of multiple thyroid nodules for many years, she has been on suppressive therapy with Synthroid. She denies any symptoms or concerns about her medication does currently.  Hypertension Good medication compliance, patient takes Prinzide 20/12.5+ lisinopril 10 mg No chest pain or headache.  Elevated fasting blood sugar Previously has been slightly elevated, patient requesting A1c. She does watch her diet.  Hyperlipidemia Good medication compliance, no side effects  PMH: Smoking status noted ROS: Per HPI  Objective: BP 125/71   Pulse (!) 112   Temp (!) 96.7 F (35.9 C) (Oral)   Ht 5\' 6"  (1.676 m)   Wt 167 lb 3.2 oz (75.8 kg)   BMI 26.99 kg/m  Gen: NAD, alert, cooperative with exam HEENT: NCAT, EOMI, PERRL CV: RRR, good S1/S2, no murmur Resp: CTABL, no wheezes, non-labored Ext: No edema, warm Neuro: Alert and oriented, No gross deficits  Assessment and plan:  # Elevated fasting blood sugar A1c pending, therapeutic lifestyle changes already in place.  # Multiple thyroid nodules Patient has follow-up with endocrinology coming up in December. TSH is slightly low, recommended changing to 75 g, however she may have just a 3 month supply of brand-name Synthroid, she may not want to change currently. Slight tachycardia today, heart rate 102 on repeat exam  # Hyperlipidemia Tolerating statin well, continue current dose LDL 109, ideally probably less than 100  # Hypertension Well-controlled No changes    Orders Placed This Encounter  Procedures  . [facility] Hb A1c Carney Bern, MD [facility] 09/15/2017, 9:17 AM

## 2017-09-16 NOTE — Telephone Encounter (Signed)
We can't order tests for a patient who is no longer under our care.  However, why don't you ask PCP about this?

## 2017-09-16 NOTE — Telephone Encounter (Signed)
Called patient and made appointment for Friday to discuss.

## 2017-09-18 ENCOUNTER — Ambulatory Visit (INDEPENDENT_AMBULATORY_CARE_PROVIDER_SITE_OTHER): Payer: Medicare Other | Admitting: Endocrinology

## 2017-09-18 VITALS — BP 156/72 | HR 109 | Wt 170.2 lb

## 2017-09-18 DIAGNOSIS — E042 Nontoxic multinodular goiter: Secondary | ICD-10-CM | POA: Diagnosis not present

## 2017-09-18 NOTE — Progress Notes (Signed)
Subjective:    Patient ID: Christine Cantrell, female    DOB: [birth year], 71 y.o.   MRN: [id number]  HPI Pt returns for f/u of goiter (she had a partial thyroidectomy in the 1980's; she has been on synthroid since then; US showed nodules in remaining tissue; bx of the nodules in 2004 was benign; most recent bx in 2017 was unchanged).  She takes 75 mcg/d (was reduced last week).  She does not notice the goiter.   Past Medical History:  Diagnosis Date  . Depression   . GERD (gastroesophageal reflux disease)   . High cholesterol   . Hypertension   . Thyroid disease    hypothyroidism    Past Surgical History:  Procedure Laterality Date  . ABDOMINAL HYSTERECTOMY  2002  . THYROIDECTOMY  1982    Social History   Social History  . Marital status: Single    Spouse name: N/A  . Number of children: N/A  . Years of education: N/A   Occupational History  . Not on file.   Social History Main Topics  . Smoking status: Never Smoker  . Smokeless tobacco: Not on file  . Alcohol use Yes     Comment: occasionally  . Drug use: Yes  . Sexual activity: Yes    Birth control/ protection: Surgical   Other Topics Concern  . Not on file   Social History Narrative   Lives alone.      Current Outpatient Prescriptions on File Prior to Visit  Medication Sig Dispense Refill  . Coenzyme Q10 (COQ10) 200 MG CAPS Take 200 mg by mouth daily.    Marland Kitchen levothyroxine (SYNTHROID) 75 MCG tablet Take 1 tablet (75 mcg total) by mouth daily before breakfast. 90 tablet 1  . lisinopril (PRINIVIL,ZESTRIL) 10 MG tablet Take 1 tablet (10 mg total) by mouth daily. PLEASE CONTACT OFFICE FOR ADDITIONAL REFILLS FINAL ATTEMPT 90 tablet 3  . lisinopril-hydrochlorothiazide (PRINZIDE,ZESTORETIC) 20-12.5 MG tablet TAKE 1 TABLET BY MOUTH  DAILY 90 tablet 1  . Multiple Vitamin (MULTIVITAMIN) capsule Take 1 capsule by mouth daily.    . Omega 3 1000 MG CAPS Take 1,000 mg by mouth daily.    Marland Kitchen omeprazole (PRILOSEC) 20 MG capsule  TAKE 1 CAPSULE BY MOUTH  DAILY 90 capsule 1  . simvastatin (ZOCOR) 40 MG tablet TAKE 1 TABLET BY MOUTH  DAILY 90 tablet 1   No current facility-administered medications on file prior to visit.     No Known Allergies  Family History  Problem Relation Age of Onset  . High Cholesterol Mother   . Hypertension Mother   . Goiter Mother   . High Cholesterol Father   . Hypertension Father   . Goiter Sister   . Goiter Brother     BP (!) 156/72   Pulse (!) 109   Wt 170 lb 3.2 oz (77.2 kg)   SpO2 98%   BMI 27.47 kg/m   Review of Systems Denies neck pain    Objective:   Physical Exam VS: see vs page.  GEN: no distress.  NECK: Neck: a healed scar is present.  2 mobile thyroid nodules are easily palpable--1 on the left, and 1 on the right.   Lab Results  Component Value Date   TSH 0.246 (L) 09/08/2017   T3TOTAL 73 03/10/2016      Assessment & Plan:  Multinodular goiter.  Pt requests repeat US Postsurgical hypothyroidism, on reduced rx.   Patient Instructions  Please recheck the thyroid blood test  in 1 month.  We'll let you know about the results. most of the time, a "lumpy thyroid" will eventually become overactive.  this is usually a slow process, happening over the span of many years.  We would first notice that you do not need any levothyroxine, then an overactive thyroid on no medication. Let's recheck the ultrasound.  you will receive a phone call, about a day and time for an appointment. Please come back for a follow-up appointment in 6 months.

## 2017-09-18 NOTE — Patient Instructions (Addendum)
Please recheck the thyroid blood test in 1 month.  We'll let you know about the results. most of the time, a "lumpy thyroid" will eventually become overactive.  this is usually a slow process, happening over the span of many years.  We would first notice that you do not need any levothyroxine, then an overactive thyroid on no medication. Let's recheck the ultrasound.  you will receive a phone call, about a day and time for an appointment. Please come back for a follow-up appointment in 6 months.

## 2017-09-25 ENCOUNTER — Telehealth: Payer: Self-pay | Admitting: Endocrinology

## 2017-09-25 DIAGNOSIS — E042 Nontoxic multinodular goiter: Secondary | ICD-10-CM

## 2017-09-25 NOTE — Telephone Encounter (Signed)
[facility] called in reference to [place] needing to be changed to ultrasound thyroid. [place]

## 2017-09-28 NOTE — Telephone Encounter (Signed)
done

## 2017-10-07 ENCOUNTER — Other Ambulatory Visit: Payer: Self-pay | Admitting: Endocrinology

## 2017-10-07 ENCOUNTER — Ambulatory Visit
Admission: RE | Admit: 2017-10-07 | Discharge: 2017-10-07 | Disposition: A | Discharge status: Home / Self Care | Payer: Medicare Other | Source: Ambulatory Visit | Attending: Endocrinology | Admitting: Endocrinology

## 2017-10-07 DIAGNOSIS — E042 Nontoxic multinodular goiter: Secondary | ICD-10-CM | POA: Diagnosis not present

## 2017-10-22 ENCOUNTER — Other Ambulatory Visit (INDEPENDENT_AMBULATORY_CARE_PROVIDER_SITE_OTHER): Payer: Medicare Other

## 2017-10-22 DIAGNOSIS — E042 Nontoxic multinodular goiter: Secondary | ICD-10-CM

## 2017-10-22 LAB — TSH: TSH: 1.17 u[IU]/mL (ref 0.35–4.50)

## 2017-11-11 ENCOUNTER — Other Ambulatory Visit: Payer: Self-pay | Admitting: Family Medicine

## 2018-01-13 ENCOUNTER — Other Ambulatory Visit: Payer: Self-pay | Admitting: Family Medicine

## 2018-02-24 ENCOUNTER — Telehealth: Payer: Self-pay | Admitting: Family Medicine

## 2018-02-24 DIAGNOSIS — I10 Essential (primary) hypertension: Secondary | ICD-10-CM

## 2018-02-24 DIAGNOSIS — E785 Hyperlipidemia, unspecified: Secondary | ICD-10-CM

## 2018-02-25 NOTE — Telephone Encounter (Signed)
Pre-visit labs   Laroy Apple, MD [facility] 02/25/2018, 1:31 PM

## 2018-02-25 NOTE — Telephone Encounter (Signed)
Pt notified order for labs is in Epic

## 2018-02-26 ENCOUNTER — Other Ambulatory Visit: Payer: Medicare Other

## 2018-02-26 DIAGNOSIS — I10 Essential (primary) hypertension: Secondary | ICD-10-CM | POA: Diagnosis not present

## 2018-02-26 DIAGNOSIS — E785 Hyperlipidemia, unspecified: Secondary | ICD-10-CM

## 2018-02-26 DIAGNOSIS — R7301 Impaired fasting glucose: Secondary | ICD-10-CM | POA: Diagnosis not present

## 2018-02-26 LAB — BAYER DCA HB A1C WAIVED: HB A1C: 5.4 % (ref <7.0)

## 2018-02-27 LAB — CBC WITH DIFFERENTIAL/PLATELET
BASOS: 2 %
Basophils Absolute: 0.1 10*3/uL (ref 0.0–0.2)
EOS (ABSOLUTE): 0.3 10*3/uL (ref 0.0–0.4)
EOS: 7 %
HEMATOCRIT: 39.8 % (ref 34.0–46.6)
HEMOGLOBIN: 14.1 g/dL (ref 11.1–15.9)
IMMATURE GRANS (ABS): 0 10*3/uL (ref 0.0–0.1)
Immature Granulocytes: 0 %
Lymphocytes Absolute: 1 10*3/uL (ref 0.7–3.1)
Lymphs: 29 %
MCH: 33.7 pg — AB (ref 26.6–33.0)
MCHC: 35.4 g/dL (ref 31.5–35.7)
MCV: 95 fL (ref 79–97)
MONOCYTES: 12 %
Monocytes Absolute: 0.4 10*3/uL (ref 0.1–0.9)
NEUTROS ABS: 1.8 10*3/uL (ref 1.4–7.0)
Neutrophils: 50 %
Platelets: 122 10*3/uL — ABNORMAL LOW (ref 150–379)
RBC: 4.18 x10E6/uL (ref 3.77–5.28)
RDW: 12.7 % (ref 12.3–15.4)
WBC: 3.6 10*3/uL (ref 3.4–10.8)

## 2018-02-27 LAB — CMP14+EGFR
ALBUMIN: 4.4 g/dL (ref 3.5–4.8)
ALK PHOS: 25 IU/L — AB (ref 39–117)
ALT: 36 IU/L — ABNORMAL HIGH (ref 0–32)
AST: 40 IU/L (ref 0–40)
Albumin/Globulin Ratio: 1.8 (ref 1.2–2.2)
BUN / CREAT RATIO: 17 (ref 12–28)
BUN: 12 mg/dL (ref 8–27)
Bilirubin Total: 0.4 mg/dL (ref 0.0–1.2)
CHLORIDE: 100 mmol/L (ref 96–106)
CO2: 24 mmol/L (ref 20–29)
Calcium: 9.6 mg/dL (ref 8.7–10.3)
Creatinine, Ser: 0.7 mg/dL (ref 0.57–1.00)
GFR calc non Af Amer: 86 mL/min/{1.73_m2} (ref >59)
GFR, EST AFRICAN AMERICAN: 99 mL/min/{1.73_m2} (ref >59)
GLOBULIN, TOTAL: 2.5 g/dL (ref 1.5–4.5)
Glucose: 112 mg/dL — ABNORMAL HIGH (ref 65–99)
Potassium: 4.1 mmol/L (ref 3.5–5.2)
SODIUM: 141 mmol/L (ref 134–144)
TOTAL PROTEIN: 6.9 g/dL (ref 6.0–8.5)

## 2018-02-27 LAB — LIPID PANEL
CHOLESTEROL TOTAL: 213 mg/dL — AB (ref 100–199)
Chol/HDL Ratio: 2.6 ratio (ref 0.0–4.4)
HDL: 81 mg/dL (ref >39)
LDL Calculated: 93 mg/dL (ref 0–99)
Triglycerides: 195 mg/dL — ABNORMAL HIGH (ref 0–149)
VLDL CHOLESTEROL CAL: 39 mg/dL (ref 5–40)

## 2018-02-27 LAB — TSH: TSH: 1.64 u[IU]/mL (ref 0.450–4.500)

## 2018-03-08 ENCOUNTER — Other Ambulatory Visit: Payer: Self-pay | Admitting: *Deleted

## 2018-03-08 MED ORDER — LEVOTHYROXINE SODIUM 75 MCG PO TABS: 75.0000 ug | ORAL_TABLET | Freq: Every day | ORAL | 3 refills | Status: DC

## 2018-03-08 MED ORDER — LISINOPRIL 10 MG PO TABS: 10.0000 mg | ORAL_TABLET | Freq: Every day | ORAL | 0 refills | Status: DC

## 2018-03-08 NOTE — Addendum Note (Signed)
Addended by: Antonietta Barcelona D on: 03/08/2018 03:33 PM   Modules accepted: Orders

## 2018-03-15 ENCOUNTER — Ambulatory Visit: Payer: Medicare Other | Admitting: Family Medicine

## 2018-03-17 ENCOUNTER — Encounter: Payer: Self-pay | Admitting: Family Medicine

## 2018-03-17 ENCOUNTER — Ambulatory Visit: Payer: Medicare Other | Admitting: Family Medicine

## 2018-03-17 VITALS — BP 117/67 | HR 93 | Temp 97.1°F | Ht 66.0 in | Wt 172.4 lb

## 2018-03-17 DIAGNOSIS — E785 Hyperlipidemia, unspecified: Secondary | ICD-10-CM | POA: Diagnosis not present

## 2018-03-17 DIAGNOSIS — I10 Essential (primary) hypertension: Secondary | ICD-10-CM | POA: Diagnosis not present

## 2018-03-17 DIAGNOSIS — R7301 Impaired fasting glucose: Secondary | ICD-10-CM | POA: Diagnosis not present

## 2018-03-17 MED ORDER — LISINOPRIL-HYDROCHLOROTHIAZIDE 20-12.5 MG PO TABS: 1.0000 | ORAL_TABLET | Freq: Every day | ORAL | 3 refills | Status: DC

## 2018-03-17 MED ORDER — SIMVASTATIN 40 MG PO TABS: 40.0000 mg | ORAL_TABLET | Freq: Every day | ORAL | 3 refills | Status: DC

## 2018-03-17 MED ORDER — LEVOTHYROXINE SODIUM 75 MCG PO TABS: 75.0000 ug | ORAL_TABLET | Freq: Every day | ORAL | 3 refills | Status: DC

## 2018-03-17 MED ORDER — LISINOPRIL 10 MG PO TABS: 10.0000 mg | ORAL_TABLET | Freq: Every day | ORAL | 3 refills | Status: DC

## 2018-03-17 MED ORDER — OMEPRAZOLE 20 MG PO CPDR: 20.0000 mg | DELAYED_RELEASE_CAPSULE | Freq: Every day | ORAL | 3 refills | Status: DC

## 2018-03-17 NOTE — Progress Notes (Signed)
   HPI  Patient presents today here to follow-up for chronic medical conditions.  Hypertension Good medication compliance with Prinzide plus lisinopril, blood pressure seems to be better controlled. No headache or chest pain.  Hyperlipidemia Good medication compliance with Zocor Needs refill.  Elevated fasting glucose Patient watching diet moderately. A1c reviewed with her which is normal  PMH: Smoking status noted ROS: Per HPI  Objective: BP 117/67   Pulse 93   Temp (!) 97.1 F (36.2 C) (Oral)   Ht 5\' 6"  (1.676 m)   Wt 172 lb 6.4 oz (78.2 kg)   BMI 27.83 kg/m  Gen: NAD, alert, cooperative with exam HEENT: NCAT,  CV: RRR, good S1/S2, no murmur Resp: CTABL, no wheezes, non-labored Ext: No edema, warm Neuro: Alert and oriented, No gross deficits  Assessment and plan:  #Hypertension Well-controlled on 30 mg of lisinopril +12.5 mg HCTZ, patient is taking Prinzide 20/12.5+10 mg of lisinopril, offered to split them into 30 mg lisinopril +12.5 HCTZ, however she is doing well currently does not want to change    hyperlipidemia Well-controlled No changes   #Elevated fasting glucose Patient states she does have a remote history of elevated A1c, 7.0 previously, however has had controlled A1c's for quite some time now. No medications, diet controlled   Meds ordered this encounter  Medications  . levothyroxine (SYNTHROID) 75 MCG tablet    Sig: Take 1 tablet (75 mcg total) by mouth daily before breakfast.    Dispense:  90 tablet    Refill:  3  . lisinopril (PRINIVIL,ZESTRIL) 10 MG tablet    Sig: Take 1 tablet (10 mg total) by mouth daily. PLEASE CONTACT OFFICE FOR ADDITIONAL REFILLS FINAL ATTEMPT    Dispense:  90 tablet    Refill:  3  . lisinopril-hydrochlorothiazide (PRINZIDE,ZESTORETIC) 20-12.5 MG tablet    Sig: Take 1 tablet by mouth daily.    Dispense:  90 tablet    Refill:  3  . omeprazole (PRILOSEC) 20 MG capsule    Sig: Take 1 capsule (20 mg total) by mouth  daily.    Dispense:  90 capsule    Refill:  3  . simvastatin (ZOCOR) 40 MG tablet    Sig: Take 1 tablet (40 mg total) by mouth daily.    Dispense:  90 tablet    Refill:  [place], MD [facility] 03/17/2018, 9:34 AM

## 2018-03-17 NOTE — Patient Instructions (Signed)
Great to see you!   

## 2018-03-18 ENCOUNTER — Encounter: Payer: Self-pay | Admitting: Endocrinology

## 2018-03-18 ENCOUNTER — Ambulatory Visit: Payer: Medicare Other | Admitting: Endocrinology

## 2018-03-18 VITALS — BP 162/82 | HR 84 | Wt 173.0 lb

## 2018-03-18 DIAGNOSIS — E042 Nontoxic multinodular goiter: Secondary | ICD-10-CM

## 2018-03-18 DIAGNOSIS — I10 Essential (primary) hypertension: Secondary | ICD-10-CM | POA: Diagnosis not present

## 2018-03-18 DIAGNOSIS — E039 Hypothyroidism, unspecified: Secondary | ICD-10-CM

## 2018-03-18 NOTE — Progress Notes (Signed)
Subjective:    Patient ID: Christine Cantrell, female    DOB: Jun 30, 1946, 72 y.o.   MRN: [id number]  HPI Pt returns for f/u of goiter (she had a partial thyroidectomy in the 1980's; she has been on synthroid since then; US showed nodules in remaining tissue; bx of the nodules in 2004 was benign; most recent US in 2018 was unchanged).  She does not notice the goiter.   Past Medical History:  Diagnosis Date  . Depression   . GERD (gastroesophageal reflux disease)   . High cholesterol   . Hypertension   . Thyroid disease    hypothyroidism    Past Surgical History:  Procedure Laterality Date  . ABDOMINAL HYSTERECTOMY  2002  . THYROIDECTOMY  1982    Social History   Socioeconomic History  . Marital status: Single    Spouse name: Not on file  . Number of children: Not on file  . Years of education: Not on file  . Highest education level: Not on file  Occupational History  . Not on file  Social Needs  . Financial resource strain: Not on file  . Food insecurity:    Worry: Not on file    Inability: Not on file  . Transportation needs:    Medical: Not on file    Non-medical: Not on file  Tobacco Use  . Smoking status: Never Smoker  . Smokeless tobacco: Never Used  Substance and Sexual Activity  . Alcohol use: Yes    Comment: occasionally  . Drug use: Yes  . Sexual activity: Yes    Birth control/protection: Surgical  Lifestyle  . Physical activity:    Days per week: Not on file    Minutes per session: Not on file  . Stress: Not on file  Relationships  . Social connections:    Talks on phone: Not on file    Gets together: Not on file    Attends religious service: Not on file    Active member of club or organization: Not on file    Attends meetings of clubs or organizations: Not on file    Relationship status: Not on file  . Intimate partner violence:    Fear of current or ex partner: Not on file    Emotionally abused: Not on file    Physically abused: Not on file   Forced sexual activity: Not on file  Other Topics Concern  . Not on file  Social History Narrative   Lives alone.      Current Outpatient Medications on File Prior to Visit  Medication Sig Dispense Refill  . Coenzyme Q10 (COQ10) 200 MG CAPS Take 200 mg by mouth daily.    Marland Kitchen levothyroxine (SYNTHROID) 75 MCG tablet Take 1 tablet (75 mcg total) by mouth daily before breakfast. 90 tablet 3  . lisinopril (PRINIVIL,ZESTRIL) 10 MG tablet Take 1 tablet (10 mg total) by mouth daily. PLEASE CONTACT OFFICE FOR ADDITIONAL REFILLS FINAL ATTEMPT 90 tablet 3  . lisinopril-hydrochlorothiazide (PRINZIDE,ZESTORETIC) 20-12.5 MG tablet Take 1 tablet by mouth daily. 90 tablet 3  . Multiple Vitamin (MULTIVITAMIN) capsule Take 1 capsule by mouth daily.    . Omega 3 1000 MG CAPS Take 1,000 mg by mouth daily.    Marland Kitchen omeprazole (PRILOSEC) 20 MG capsule Take 1 capsule (20 mg total) by mouth daily. 90 capsule 3  . simvastatin (ZOCOR) 40 MG tablet Take 1 tablet (40 mg total) by mouth daily. 90 tablet 3   No current facility-administered medications on  file prior to visit.     No Known Allergies  Family History  Problem Relation Age of Onset  . High Cholesterol Mother   . Hypertension Mother   . Goiter Mother   . High Cholesterol Father   . Hypertension Father   . Goiter Sister   . Goiter Brother     BP (!) 162/82 (BP Location: Left Arm, Patient Position: Sitting, Cuff Size: Normal)   Pulse 84   Wt 173 lb (78.5 kg)   SpO2 97%   BMI 27.92 kg/m    Review of Systems Denies neck pain    Objective:   Physical Exam VS: see vs page.  GEN: no distress.  NECK: Neck: a healed scar is present.  2 mobile thyroid nodules are again easily palpable--1 on the left, and 1 on the right.    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  Please see your primary care provider soon, to have it rechecked.   Please continue the same medication.  Please come back for a follow-up appointment in 1 year.

## 2018-03-18 NOTE — Patient Instructions (Addendum)
Your blood pressure is high today.  Please see your primary care provider soon, to have it rechecked Please continue the same medication.   Please come back for a follow-up appointment in 1 year.   

## 2018-04-19 DIAGNOSIS — Z1231 Encounter for screening mammogram for malignant neoplasm of breast: Secondary | ICD-10-CM | POA: Diagnosis not present

## 2018-05-03 ENCOUNTER — Telehealth: Payer: Self-pay | Admitting: Family Medicine

## 2018-05-04 NOTE — Telephone Encounter (Signed)
Mailed pt a copy of mammogram per her request

## 2018-05-11 ENCOUNTER — Encounter: Payer: Self-pay | Admitting: Family Medicine

## 2018-05-11 ENCOUNTER — Ambulatory Visit: Payer: Medicare Other | Admitting: Family Medicine

## 2018-05-11 VITALS — BP 138/69 | HR 81 | Temp 97.0°F | Ht 66.0 in | Wt 172.4 lb

## 2018-05-11 DIAGNOSIS — R1013 Epigastric pain: Secondary | ICD-10-CM | POA: Diagnosis not present

## 2018-05-11 NOTE — Progress Notes (Signed)
   HPI  Patient presents today here for abdominal pain.  Patient reports 3 to 4 days of abdominal pain, initially it was sharp epigastric pain that radiated to her left rib/breast area.  There were no alleviating or aggravating factors.  She denies F/c/s, she denies N/V/D, no diarrhea.   She is now improving but has persistent soreness  PMH: Smoking status noted ROS: Per HPI  Objective: BP 138/69   Pulse 81   Temp (!) 97 F (36.1 C) (Oral)   Ht _0  (1.676 m)   Wt 172 lb 6.4 oz (78.2 kg)   BMI 27.83 kg/m  Gen: NAD, alert, cooperative with exam HEENT: NCAT CV: RRR, good S1/S2, no murmur Resp: CTABL, no wheezes, non-labored Abd: SNTND, BS present, no guarding or organomegaly Ext: No edema, warm Neuro: Alert and oriented, No gross deficits  Assessment and plan:  # Epigastric pain No red flags, reassuring exam.  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 She wouldlike names of preferred GI providers.     Orders Placed This Encounter  Procedures  . CMP14+EGFR  . CBC with Differential/Platelet  . Lipase     Laroy Apple, MD [facility] 05/11/2018, 1:47 PM

## 2018-05-12 LAB — CMP14+EGFR
A/G RATIO: 1.8 (ref 1.2–2.2)
ALK PHOS: 25 IU/L — AB (ref 39–117)
ALT: 21 IU/L (ref 0–32)
AST: 21 IU/L (ref 0–40)
Albumin: 4.1 g/dL (ref 3.5–4.8)
BUN/Creatinine Ratio: 24 (ref 12–28)
BUN: 15 mg/dL (ref 8–27)
Bilirubin Total: 0.8 mg/dL (ref 0.0–1.2)
CALCIUM: 8.8 mg/dL (ref 8.7–10.3)
CHLORIDE: 93 mmol/L — AB (ref 96–106)
CO2: 21 mmol/L (ref 20–29)
CREATININE: 0.62 mg/dL (ref 0.57–1.00)
GFR calc Af Amer: 103 mL/min/{1.73_m2} (ref >59)
GFR calc non Af Amer: 89 mL/min/{1.73_m2} (ref >59)
GLOBULIN, TOTAL: 2.3 g/dL (ref 1.5–4.5)
Glucose: 118 mg/dL — ABNORMAL HIGH (ref 65–99)
POTASSIUM: 4.1 mmol/L (ref 3.5–5.2)
SODIUM: 132 mmol/L — AB (ref 134–144)
Total Protein: 6.4 g/dL (ref 6.0–8.5)

## 2018-05-12 LAB — CBC WITH DIFFERENTIAL/PLATELET
BASOS ABS: 0 10*3/uL (ref 0.0–0.2)
Basos: 0 %
EOS (ABSOLUTE): 0 10*3/uL (ref 0.0–0.4)
Eos: 0 %
Hematocrit: 39.2 % (ref 34.0–46.6)
Hemoglobin: 13.6 g/dL (ref 11.1–15.9)
IMMATURE GRANULOCYTES: 0 %
Immature Grans (Abs): 0 10*3/uL (ref 0.0–0.1)
Lymphocytes Absolute: 0.4 10*3/uL — ABNORMAL LOW (ref 0.7–3.1)
Lymphs: 6 %
MCH: 32.5 pg (ref 26.6–33.0)
MCHC: 34.7 g/dL (ref 31.5–35.7)
MCV: 94 fL (ref 79–97)
MONOS ABS: 1 10*3/uL — AB (ref 0.1–0.9)
Monocytes: 15 %
NEUTROS PCT: 79 %
Neutrophils Absolute: 5.2 10*3/uL (ref 1.4–7.0)
PLATELETS: 142 10*3/uL — AB (ref 150–379)
RBC: 4.18 x10E6/uL (ref 3.77–5.28)
RDW: 12.6 % (ref 12.3–15.4)
WBC: 6.7 10*3/uL (ref 3.4–10.8)

## 2018-05-12 LAB — LIPASE: Lipase: 506 U/L — ABNORMAL HIGH (ref 14–85)

## 2018-05-13 ENCOUNTER — Encounter: Payer: Self-pay | Admitting: Family Medicine

## 2018-05-13 ENCOUNTER — Ambulatory Visit: Payer: Medicare Other | Admitting: Family Medicine

## 2018-05-13 VITALS — BP 141/73 | HR 79 | Temp 98.0°F | Ht 66.0 in | Wt 172.4 lb

## 2018-05-13 DIAGNOSIS — K8591 Acute pancreatitis with uninfected necrosis, unspecified: Secondary | ICD-10-CM

## 2018-05-13 NOTE — Progress Notes (Signed)
   HPI  Patient presents today for follow-up abdominal pain.  Patient was seen earlier this week with improving abdominal pain, lipase was found to be around 500.  She states that she is tolerating food and fluids normally.  She denies any recent alcohol use.  Patient states she drinks approximately 1 glass of wine twice a week on average.  Her triglycerides were checked earlier this year and were less than 200.  She has never had problems with gallstones or pancreatitis.  PMH: Smoking status noted ROS: Per HPI  Objective: BP (!) 141/73   Pulse 79   Temp 98 F (36.7 C) (Oral)   Ht _0  (1.676 m)   Wt 172 lb 6.4 oz (78.2 kg)   BMI 27.83 kg/m  Gen: NAD, alert, cooperative with exam HEENT: NCAT CV: RRR, good S1/S2, no murmur Resp: CTABL, no wheezes, non-labored Abd: SNTND, BS present, no guarding or organomegaly Ext: No edema, warm Neuro: Alert and oriented, No gross deficits  Assessment and plan:  #Acute pancreatitis Resolving Patient would like to trend labs, lipase and BMP today. Recommended ultrasound to rule out gallstone pancreatitis She has a few family members that have had pancreatic cancer, if this ultrasound is negative would consider CT abdomen   Orders Placed This Encounter  Procedures  . US Abdomen Complete    Standing Status:   Future    Standing Expiration Date:   07/14/2019    Order Specific Question:   Reason for Exam (SYMPTOM  OR DIAGNOSIS REQUIRED)    Answer:   pancreatitis    Order Specific Question:   Preferred imaging location?    Answer:   [facility]  . Lipase  . [place], MD [facility] 05/13/2018, 2:10 PM

## 2018-05-14 LAB — BMP8+EGFR
BUN/Creatinine Ratio: 21 (ref 12–28)
BUN: 14 mg/dL (ref 8–27)
CO2: 24 mmol/L (ref 20–29)
Calcium: 9 mg/dL (ref 8.7–10.3)
Chloride: 97 mmol/L (ref 96–106)
Creatinine, Ser: 0.67 mg/dL (ref 0.57–1.00)
GFR calc Af Amer: 100 mL/min/{1.73_m2} (ref >59)
GFR calc non Af Amer: 87 mL/min/{1.73_m2} (ref >59)
Glucose: 168 mg/dL — ABNORMAL HIGH (ref 65–99)
Potassium: 4 mmol/L (ref 3.5–5.2)
Sodium: 136 mmol/L (ref 134–144)

## 2018-05-14 LAB — LIPASE: Lipase: 521 U/L — ABNORMAL HIGH (ref 14–85)

## 2018-05-17 ENCOUNTER — Telehealth: Payer: Self-pay | Admitting: Family Medicine

## 2018-05-17 ENCOUNTER — Ambulatory Visit
Admission: RE | Admit: 2018-05-17 | Discharge: 2018-05-17 | Disposition: A | Discharge status: Home / Self Care | Payer: Medicare Other | Source: Ambulatory Visit | Attending: Family Medicine | Admitting: Family Medicine

## 2018-05-17 DIAGNOSIS — K862 Cyst of pancreas: Secondary | ICD-10-CM

## 2018-05-17 DIAGNOSIS — K859 Acute pancreatitis without necrosis or infection, unspecified: Secondary | ICD-10-CM

## 2018-05-17 MED ORDER — IOPAMIDOL (ISOVUE-300) INJECTION 61%
100.0000 mL | Freq: Once | INTRAVENOUS | Status: AC | PRN
  Administered 2018-05-17: 100 mL via INTRAVENOUS

## 2018-05-17 NOTE — Telephone Encounter (Signed)
We discussed her labs.  For now would recommend avoiding giving blood until her acute issues are resolved.  Patient felt much better at the time of repeat labs, her lipase is still quite elevated.  We will go ahead and get stat CT.   Laroy Apple, MD [facility] 05/17/2018, 11:52 AM

## 2018-05-18 NOTE — Addendum Note (Signed)
Addended by: Timmothy Euler on: 05/18/2018 11:54 AM   Modules accepted: Orders

## 2018-05-18 NOTE — Telephone Encounter (Signed)
Called and discussed CT findings, Follow up MRI ordered.   Laroy Apple, MD [facility] 05/18/2018, 11:54 AM

## 2018-05-18 NOTE — Telephone Encounter (Signed)
Attempted to contact [facility] twice, no answer.

## 2018-05-18 NOTE — Telephone Encounter (Signed)
Results received, will call and discuss with pt later this morning.   Laroy Apple, MD [facility] 05/18/2018, 7:43 AM

## 2018-05-22 ENCOUNTER — Ambulatory Visit
Admission: RE | Admit: 2018-05-22 | Discharge: 2018-05-22 | Disposition: A | Discharge status: Home / Self Care | Payer: Medicare Other | Source: Ambulatory Visit | Attending: Family Medicine | Admitting: Family Medicine

## 2018-05-22 DIAGNOSIS — K862 Cyst of pancreas: Secondary | ICD-10-CM

## 2018-05-27 ENCOUNTER — Encounter: Payer: Self-pay | Admitting: *Deleted

## 2018-05-27 ENCOUNTER — Ambulatory Visit
Admission: RE | Admit: 2018-05-27 | Discharge: 2018-05-27 | Disposition: A | Discharge status: Home / Self Care | Payer: Medicare Other | Source: Ambulatory Visit | Attending: Family Medicine | Admitting: Family Medicine

## 2018-05-27 DIAGNOSIS — N200 Calculus of kidney: Secondary | ICD-10-CM | POA: Diagnosis not present

## 2018-05-27 DIAGNOSIS — K8591 Acute pancreatitis with uninfected necrosis, unspecified: Secondary | ICD-10-CM

## 2018-05-28 ENCOUNTER — Encounter: Payer: Self-pay | Admitting: Family Medicine

## 2018-05-28 ENCOUNTER — Ambulatory Visit: Payer: Medicare Other | Admitting: Family Medicine

## 2018-05-28 DIAGNOSIS — K862 Cyst of pancreas: Secondary | ICD-10-CM | POA: Diagnosis not present

## 2018-05-28 NOTE — Progress Notes (Signed)
   HPI  Patient presents today here to discuss pancreatic cyst and MRI.  Patient is very nervous about gadolinium exposure.  She states that she has done extensive reading and that she would like to try an MRI without contrast instead of an MRI with contrast.  If she needs to have contrast for certainty sake she states that she would like to try the contrast agent with the least amount of time the gadolinium stays present in the body, she brings up ProHance.   We discussed pros and cons of not pursuing MRI with contrast, I explained that radiology will likely not be able to comment with certainty.  She would like to go ahead and proceed with MRI without.  She also has questions about a diagnosis code that is listed on the ultrasound, she did have acute pancreatitis, however it appears that she has listed acute pancreatitis with necrosis listed as well, this is an error, she does not have evidence of necrosis. Marland Kitchen  PMH: Smoking status noted ROS: Per HPI  Objective: BP 131/70   Pulse 74   Temp (!) 97 F (36.1 C) (Oral)   Ht 5\' 6"  (1.676 m)   Wt 166 lb 3.2 oz (75.4 kg)   BMI 26.83 kg/m  Gen: NAD, alert, cooperative with exam HEENT: NCAT Ext: No edema, warm Neuro: Alert and oriented, No gross deficits     Assessment and plan:  #Pancreatic cyst Changing MRI with and without to MRI without. Discussed that she will likely not have true diagnostic certainty, however she would like to go ahead and pursue this film and then consider MRI with contrast if there was not enough certainty.   Orders Placed This Encounter  Procedures  . MR ABDOMEN WO CONTRAST    Pt does not want exposure to gadolinium    Standing Status:   Future    Standing Expiration Date:   07/29/2019    Order Specific Question:   What is the patient's sedation requirement?    Answer:   No Sedation    Order Specific Question:   Does the patient have a pacemaker or implanted devices?    Answer:   No    Order Specific  Question:   Preferred imaging location?    Answer:   [facility] (table limit-550lbs)    Order Specific Question:   Radiology Contrast Protocol - do NOT remove file path    Answer:   [email or web address]     Laroy Apple, MD [facility] 05/28/2018, 4:48 PM

## 2018-05-31 ENCOUNTER — Telehealth: Payer: Self-pay | Admitting: Family Medicine

## 2018-05-31 NOTE — Telephone Encounter (Signed)
My understanding when she left was that she did not want any contrast, she asked me to order MR abd without.   Will ask if pohance is available.   I do prefer with contrast.   Laroy Apple, MD [facility] 05/31/2018, 4:33 PM

## 2018-05-31 NOTE — Telephone Encounter (Signed)
Pt was seen Friday and Dr Wendi Snipes was suppose to check and see if there was another contrast she could use, its best to Call tomorrow 06/01/2018

## 2018-06-10 ENCOUNTER — Ambulatory Visit
Admission: RE | Admit: 2018-06-10 | Discharge: 2018-06-10 | Disposition: A | Discharge status: Home / Self Care | Payer: Medicare Other | Source: Ambulatory Visit | Attending: Family Medicine | Admitting: Family Medicine

## 2018-06-10 ENCOUNTER — Telehealth: Payer: Self-pay | Admitting: Family Medicine

## 2018-06-10 DIAGNOSIS — K869 Disease of pancreas, unspecified: Secondary | ICD-10-CM | POA: Diagnosis not present

## 2018-06-10 MED ORDER — GADOBENATE DIMEGLUMINE 529 MG/ML IV SOLN
15.0000 mL | Freq: Once | INTRAVENOUS | Status: AC | PRN
  Administered 2018-06-10: 15 mL via INTRAVENOUS

## 2018-06-10 NOTE — Telephone Encounter (Signed)
Test with patient at length, some concern for possible malignant hepatic lesion versus very benign hepatic lesion.  I would consider discussing this with oncology or GI as my preference.  PET scan versus biopsy  She will follow-up in the clinic tomorrow to discuss further  Christine Cantrell

## 2018-06-11 ENCOUNTER — Encounter: Payer: Self-pay | Admitting: Family Medicine

## 2018-06-11 ENCOUNTER — Ambulatory Visit: Payer: Medicare Other | Admitting: Family Medicine

## 2018-06-11 VITALS — BP 121/70 | HR 85 | Temp 97.7°F | Ht 66.0 in | Wt 163.0 lb

## 2018-06-11 DIAGNOSIS — R748 Abnormal levels of other serum enzymes: Secondary | ICD-10-CM | POA: Diagnosis not present

## 2018-06-11 DIAGNOSIS — K769 Liver disease, unspecified: Secondary | ICD-10-CM | POA: Diagnosis not present

## 2018-06-11 NOTE — Telephone Encounter (Signed)
appt made

## 2018-06-11 NOTE — Progress Notes (Signed)
   HPI  Patient presents today here to discuss recent findings on her MRI.  She feels fine and has lots of questions. She would like her lipase checked.  She is interested to see if it is improved.  This began after an episode of abdominal pain that lasted up the weekend, she was seen by me early the next week and found to have a lipase over 500.  Considering her age, non-alcohol use, I recommended work-up with CT scan and ultrasound.  Ultrasound of gallstone pancreatitis or gallstones, CT scan was concerning for pancreatic lesion.  MRI was ordered which shows likely simple cyst of the pancreas but raises new concern for hepatic lesion with possibilities ranging from hepatic steatosis to malignancy.  PMH: Smoking status noted ROS: Per HPI  Objective: BP 121/70 (BP Location: Left Arm)   Pulse 85   Temp 97.7 F (36.5 C) (Oral)   Ht 5\' 6"  (1.676 m)   Wt 163 lb (73.9 kg)   BMI 26.31 kg/m  Gen: NAD, alert, cooperative with exam HEENT: NCAT, EOMI, PERRL Ext: No edema, warm Neuro: Alert and oriented, No gross deficits  Assessment and plan:  #Hepatic lesion, elevated lipase Repeat lipase as per patient's request Her situation is complex and I recommended that she discuss options with oncology.  We discussed that likely a PET scan and/or biopsy may be involved.   Orders Placed This Encounter  Procedures  . Lipase  . Ambulatory referral to Hematology / Oncology    Referral Priority:   Routine    Referral Type:   Consultation    Referral Reason:   Specialty Services Required    Referred to Provider:   Volanda Napoleon, MD    Number of Visits Requested:   [place], MD [facility] 06/11/2018, 1:48 PM

## 2018-06-12 LAB — LIPASE: Lipase: 125 U/L — ABNORMAL HIGH (ref 14–85)

## 2018-06-14 ENCOUNTER — Telehealth: Payer: Self-pay | Admitting: Family Medicine

## 2018-06-14 DIAGNOSIS — K769 Liver disease, unspecified: Secondary | ICD-10-CM

## 2018-06-14 NOTE — Telephone Encounter (Signed)
Called and discussed with the patient.   I have referred to Dr. Marin Olp who has called and made recommendations, I appreciate his Opinion.   Will pursue IR US Bx. Order placed, will ask scheduling to disucss if there is a different desired method, ie- should I refer to IR directly and let them order [place].   Laroy Apple, MD [facility] 06/14/2018, 12:02 PM

## 2018-06-15 ENCOUNTER — Telehealth: Payer: Self-pay | Admitting: Family Medicine

## 2018-06-15 NOTE — Telephone Encounter (Signed)
Called and explained liver biopsy

## 2018-06-17 ENCOUNTER — Telehealth: Payer: Self-pay | Admitting: Family Medicine

## 2018-06-17 DIAGNOSIS — K769 Liver disease, unspecified: Secondary | ICD-10-CM

## 2018-06-17 NOTE — Telephone Encounter (Signed)
Called and discussed with patient her concerns  Pt has spoken with Radiology tech today and states that they are very unsure or not they can access the lesion on MRI.  Patient is very anxious.  She states that if it is not able to be accessed with the biopsy why do not we avoid doing a biopsy and have a surgical procedure to have the area biopsied surgically.  She is very concerned that they may need to do a repeat ultrasound, CT, or MRI.  I will ask for a consult for interventional radiology to see if they can discuss it with the patient directly.  Laroy Apple, MD [facility] 06/17/2018, 5:21 PM

## 2018-06-17 NOTE — Telephone Encounter (Signed)
PT has called to speak to Dr Wendi Snipes only about the Needle biopsy she has scheduled, they are  wants to do a CT Scan, 2 CT scans have already been done, they also said they may have to do MRI and ultrasound. Feels like test are are being duplicated. She wants Dr Wendi Snipes to  Please call her between 12-1 PM today, she is going to be at home

## 2018-06-22 ENCOUNTER — Other Ambulatory Visit: Payer: Self-pay | Admitting: Radiology

## 2018-06-23 ENCOUNTER — Other Ambulatory Visit: Payer: Self-pay | Admitting: Student

## 2018-06-24 ENCOUNTER — Telehealth: Payer: Self-pay | Admitting: Family Medicine

## 2018-06-24 ENCOUNTER — Ambulatory Visit (HOSPITAL_COMMUNITY)
Admission: RE | Admit: 2018-06-24 | Discharge: 2018-06-24 | Disposition: A | Discharge status: Home / Self Care | Payer: Medicare Other | Source: Ambulatory Visit | Attending: Family Medicine | Admitting: Family Medicine

## 2018-06-24 ENCOUNTER — Encounter (HOSPITAL_COMMUNITY): Payer: Self-pay

## 2018-06-24 DIAGNOSIS — K769 Liver disease, unspecified: Secondary | ICD-10-CM | POA: Diagnosis present

## 2018-06-24 DIAGNOSIS — K760 Fatty (change of) liver, not elsewhere classified: Secondary | ICD-10-CM | POA: Diagnosis not present

## 2018-06-24 DIAGNOSIS — E7800 Pure hypercholesterolemia, unspecified: Secondary | ICD-10-CM | POA: Insufficient documentation

## 2018-06-24 DIAGNOSIS — K753 Granulomatous hepatitis, not elsewhere classified: Secondary | ICD-10-CM | POA: Insufficient documentation

## 2018-06-24 DIAGNOSIS — Z8249 Family history of ischemic heart disease and other diseases of the circulatory system: Secondary | ICD-10-CM | POA: Insufficient documentation

## 2018-06-24 DIAGNOSIS — Z79890 Hormone replacement therapy (postmenopausal): Secondary | ICD-10-CM | POA: Diagnosis not present

## 2018-06-24 DIAGNOSIS — Z8349 Family history of other endocrine, nutritional and metabolic diseases: Secondary | ICD-10-CM | POA: Diagnosis not present

## 2018-06-24 DIAGNOSIS — K7689 Other specified diseases of liver: Secondary | ICD-10-CM | POA: Diagnosis not present

## 2018-06-24 DIAGNOSIS — E890 Postprocedural hypothyroidism: Secondary | ICD-10-CM | POA: Insufficient documentation

## 2018-06-24 DIAGNOSIS — I10 Essential (primary) hypertension: Secondary | ICD-10-CM | POA: Insufficient documentation

## 2018-06-24 DIAGNOSIS — Z79899 Other long term (current) drug therapy: Secondary | ICD-10-CM | POA: Insufficient documentation

## 2018-06-24 LAB — CBC
HCT: 40.4 % (ref 36.0–46.0)
Hemoglobin: 14.4 g/dL (ref 12.0–15.0)
MCH: 31.6 pg (ref 26.0–34.0)
MCHC: 35.6 g/dL (ref 30.0–36.0)
MCV: 88.8 fL (ref 78.0–100.0)
PLATELETS: 200 10*3/uL (ref 150–400)
RBC: 4.55 MIL/uL (ref 3.87–5.11)
RDW: 11.3 % — AB (ref 11.5–15.5)
WBC: 4.7 10*3/uL (ref 4.0–10.5)

## 2018-06-24 LAB — PROTIME-INR
INR: 1.02
Prothrombin Time: 13.3 seconds (ref 11.4–15.2)

## 2018-06-24 MED ORDER — SODIUM CHLORIDE 0.9 % IV SOLN
INTRAVENOUS | Status: AC | PRN
  Administered 2018-06-24: 10 mL/h via INTRAVENOUS

## 2018-06-24 MED ORDER — GELATIN ABSORBABLE 12-7 MM EX MISC
CUTANEOUS | Status: AC
  Filled 2018-06-24: qty 1

## 2018-06-24 MED ORDER — FENTANYL CITRATE (PF) 100 MCG/2ML IJ SOLN
INTRAMUSCULAR | Status: AC
  Filled 2018-06-24: qty 2

## 2018-06-24 MED ORDER — FENTANYL CITRATE (PF) 100 MCG/2ML IJ SOLN
INTRAMUSCULAR | Status: AC | PRN
  Administered 2018-06-24 (×2): 25 ug via INTRAVENOUS

## 2018-06-24 MED ORDER — SODIUM CHLORIDE 0.9 % IV SOLN: INTRAVENOUS | Status: DC

## 2018-06-24 MED ORDER — MIDAZOLAM HCL 2 MG/2ML IJ SOLN
INTRAMUSCULAR | Status: AC | PRN
  Administered 2018-06-24: 1 mg via INTRAVENOUS
  Administered 2018-06-24: 0.5 mg via INTRAVENOUS

## 2018-06-24 MED ORDER — LIDOCAINE-EPINEPHRINE 1 %-1:100000 IJ SOLN
INTRAMUSCULAR | Status: AC
  Filled 2018-06-24: qty 1

## 2018-06-24 MED ORDER — MIDAZOLAM HCL 2 MG/2ML IJ SOLN
INTRAMUSCULAR | Status: AC
  Filled 2018-06-24: qty 2

## 2018-06-24 NOTE — Procedures (Signed)
Pre Procedure Dx: Liver mass Post Procedural Dx: Same  Technically successful US guided biopsy of indeterminate mass within the dome of the right lobe of the liver.  EBL: None  No immediate complications.   Ronny Bacon, MD Pager #: [phone]

## 2018-06-24 NOTE — Discharge Instructions (Addendum)
Liver Biopsy, Care After °Refer to this sheet in the next few weeks. These instructions provide you with information on caring for yourself after your procedure. Your health care provider may also give you more specific instructions. Your treatment has been planned according to current medical practices, but problems sometimes occur. Call your health care provider if you have any problems or questions after your procedure. °What can I expect after the procedure? °After your procedure, it is typical to have the following: °· A small amount of discomfort in the area where the biopsy was done and in the right shoulder or shoulder blade. °· A small amount of bruising around the area where the biopsy was done and on the skin over the liver. °· Sleepiness and fatigue for the rest of the day. ° °Follow these instructions at home: °· Rest at home for 1-2 days or as directed by your health care provider. °· Have a friend or family member stay with you for at least 24 hours. °· Because of the medicines used during the procedure, you should not do the following things in the first 24 hours: °? Drive. °? Use machinery. °? Be responsible for the care of other people. °? Sign legal documents. °? Take a bath or shower. °· There are many different ways to close and cover an incision, including stitches, skin glue, and adhesive strips. Follow your health care provider's instructions on: °? Incision care. °? Bandage (dressing) changes and removal. °? Incision closure removal. °· Do not drink alcohol in the first week. °· Do not lift more than 5 pounds or play contact sports for 2 weeks after this test. °· Take medicines only as directed by your health care provider. Do not take medicine containing aspirin or non-steroidal anti-inflammatory medicines such as ibuprofen for 1 week after this test. °· It is your responsibility to get your test results. °Contact a health care provider if: °· You have increased bleeding from an incision  that results in more than a small spot of blood. °· You have redness, swelling, or increasing pain in any incisions. °· You notice a discharge or a bad smell coming from any of your incisions. °· You have a fever or chills. °Get help right away if: °· You develop swelling, bloating, or pain in your abdomen. °· You become dizzy or faint. °· You develop a rash. °· You are nauseous or vomit. °· You have difficulty breathing, feel short of breath, or feel faint. °· You develop chest pain. °· You have problems with your speech or vision. °· You have trouble balancing or moving your arms or legs. °This information is not intended to replace advice given to you by your health care provider. Make sure you discuss any questions you have with your health care provider. °Document Released: 07/04/2005 Document Revised: 05/22/2016 Document Reviewed: 02/10/2014 °Elsevier Interactive Patient Education © 2018 [employer] °Moderate Conscious Sedation, Adult, Care After °These instructions provide you with information about caring for yourself after your procedure. Your health care provider may also give you more specific instructions. Your treatment has been planned according to current medical practices, but problems sometimes occur. Call your health care provider if you have any problems or questions after your procedure. °What can I expect after the procedure? °After your procedure, it is common: °· To feel sleepy for several hours. °· To feel clumsy and have poor balance for several hours. °· To have poor judgment for several hours. °· To vomit if you eat   too soon. ° °Follow these instructions at home: °For at least 24 hours after the procedure: ° °· Do not: °? Participate in activities where you could fall or become injured. °? Drive. °? Use heavy machinery. °? Drink alcohol. °? Take sleeping pills or medicines that cause drowsiness. °? Make important decisions or sign legal documents. °? Take care of children on your  own. °· Rest. °Eating and drinking °· Follow the diet recommended by your health care provider. °· If you vomit: °? Drink water, juice, or soup when you can drink without vomiting. °? Make sure you have little or no nausea before eating solid foods. °General instructions °· Have a responsible adult stay with you until you are awake and alert. °· Take over-the-counter and prescription medicines only as told by your health care provider. °· If you smoke, do not smoke without supervision. °· Keep all follow-up visits as told by your health care provider. This is important. °Contact a health care provider if: °· You keep feeling nauseous or you keep vomiting. °· You feel light-headed. °· You develop a rash. °· You have a fever. °Get help right away if: °· You have trouble breathing. °This information is not intended to replace advice given to you by your health care provider. Make sure you discuss any questions you have with your health care provider. °Document Released: 10/05/2013 Document Revised: 05/19/2016 Document Reviewed: 04/05/2016 °Elsevier Interactive Patient Education © 2018 [employer] ° °

## 2018-06-24 NOTE — H&P (Signed)
Chief Complaint: Patient was seen in consultation today for liver lesion biopsy at the request of [place] L  Referring Physician(s): [place] L  Supervising Physician: Sandi Mariscal  Patient Status: [facility] - Out-pt  History of Present Illness: Christine Cantrell is a 72 y.o. female   In May pt noted abd pain PCP ordered US and CT Abnormal findings of pancreatic lesion led to MRI 6/13 MR:  IMPRESSION: 1. 1.9 by 1.5 by 2.3 cm simple appearing cystic lesion along the posterior margin of pancreatic head, questionable continuity with the dorsal pancreatic duct. Pancreas divisum is present and no pancreatic duct dilatation is observed. Possibilities may include pseudocyst or intraductal papillary mucinous neoplasm. Based on current guidelines, follow pancreatic protocol MRI is recommended in 6 months time. This recommendation follows ACR consensus guidelines: Management of Incidental Pancreatic Cysts: A White Paper of the ACR Incidental Findings Committee. J Am Coll Radiol 2979;89:211-941. 2. Unusual 5.5 cm lesion in segment 7 of the liver appears to have vessels coursing through it (suggesting an infiltrative process rather than a mass lesion) but demonstrates accentuated diffusion weighted signal, faintly increased T2 signal, and arterial and later phase enhancement. Possibilities may include confluent fibrosis, unusual hepatic steatosis with greater than 50% fat fraction (although this should not cause diffusion-weighted signal), or an infiltrative malignancy such as infiltrative hepatocellular carcinoma, cholangiocarcinoma, or lymphoma. The enhancement pattern could conceivably also be seen in [facility], but normally [place] would have mass effect on surrounding vessels rather than an infiltrative appearance. Because of the possibility of malignancy and size of the lesion, further workup is recommended. Possible further workups and areas would include biopsy, short-term repeat  MRI using Eovist contrast medium (if the lesion takes up Eovist on delayed images it is likely benign), or nuclear medicine PET-CT.  Now scheduled for liver lesion biopsy   Past Medical History:  Diagnosis Date  . Depression   . GERD (gastroesophageal reflux disease)   . High cholesterol   . Hypertension   . Thyroid disease    hypothyroidism    Past Surgical History:  Procedure Laterality Date  . ABDOMINAL HYSTERECTOMY  2002  . THYROIDECTOMY  1982    Allergies: Patient has no known allergies.  Medications: Prior to Admission medications   Medication Sig Start Date End Date Taking? Authorizing Provider  Coenzyme Q10 (COQ10) 200 MG CAPS Take 200 mg by mouth daily.   Yes [provider]  levothyroxine (SYNTHROID) 75 MCG tablet Take 1 tablet (75 mcg total) by mouth daily before breakfast. 03/17/18  Yes Timmothy Euler, MD  lisinopril (PRINIVIL,ZESTRIL) 10 MG tablet Take 1 tablet (10 mg total) by mouth daily. PLEASE CONTACT OFFICE FOR ADDITIONAL REFILLS FINAL ATTEMPT 03/17/18  Yes Timmothy Euler, MD  lisinopril-hydrochlorothiazide (PRINZIDE,ZESTORETIC) 20-12.5 MG tablet Take 1 tablet by mouth daily. 03/17/18  Yes Timmothy Euler, MD  Multiple Vitamin (MULTIVITAMIN) capsule Take 1 capsule by mouth daily.   Yes [provider]  Omega 3 1000 MG CAPS Take 1,000 mg by mouth daily.   Yes [provider]     Family History  Problem Relation Age of Onset  . High Cholesterol Mother   . Hypertension Mother   . Goiter Mother   . High Cholesterol Father   . Hypertension Father   . Goiter Sister   . Goiter Brother     Social History   Socioeconomic History  . Marital status: Single    Spouse name: Not on file  . Number of children: Not on  file  . Years of education: Not on file  . Highest education level: Not on file  Occupational History  . Not on file  Social Needs  . Financial resource strain: Not on file  . Food insecurity:    Worry:  Not on file    Inability: Not on file  . Transportation needs:    Medical: Not on file    Non-medical: Not on file  Tobacco Use  . Smoking status: Never Smoker  . Smokeless tobacco: Never Used  Substance and Sexual Activity  . Alcohol use: Yes    Comment: occasionally  . Drug use: Never  . Sexual activity: Yes    Birth control/protection: Surgical  Lifestyle  . Physical activity:    Days per week: Not on file    Minutes per session: Not on file  . Stress: Not on file  Relationships  . Social connections:    Talks on phone: Not on file    Gets together: Not on file    Attends religious service: Not on file    Active member of club or organization: Not on file    Attends meetings of clubs or organizations: Not on file    Relationship status: Not on file  Other Topics Concern  . Not on file  Social History Narrative   Lives alone.      Review of Systems: A 12 point ROS discussed and pertinent positives are indicated in the HPI above.  All other systems are negative.  Review of Systems  Constitutional: Negative for activity change, fatigue and fever.  Respiratory: Negative for shortness of breath.   Cardiovascular: Negative for chest pain.  Gastrointestinal: Positive for abdominal pain.  Musculoskeletal: Negative for back pain.  Neurological: Negative for weakness.  Psychiatric/Behavioral: Negative for behavioral problems and confusion.    Vital Signs: BP (!) 143/94   Pulse 75   Temp 98.2 F (36.8 C) (Oral)   Resp 16   Ht 5\' 6"  (1.676 m)   Wt 162 lb (73.5 kg)   SpO2 100%   BMI 26.15 kg/m   Physical Exam  Constitutional: She is oriented to person, place, and time.  Cardiovascular: Normal rate, regular rhythm and normal heart sounds.  Pulmonary/Chest: Effort normal and breath sounds normal.  Abdominal: Soft. Bowel sounds are normal. There is tenderness.  Musculoskeletal: Normal range of motion.  Neurological: She is alert and oriented to person, place, and  time.  Skin: Skin is warm and dry.  Psychiatric: She has a normal mood and affect. Her behavior is normal. Judgment and thought content normal.  Nursing note and vitals reviewed.   Imaging: Mr Abdomen W Wo Contrast  Result Date: 06/10/2018 CLINICAL DATA:  Left-sided abdominal pain in May 2019. Pancreatic lesion seen on CT of 05/17/2018, for further characterization. EXAM: MRI ABDOMEN WITHOUT AND WITH CONTRAST TECHNIQUE: Multiplanar multisequence MR imaging of the abdomen was performed both before and after the administration of intravenous contrast. CONTRAST:  28mL MULTIHANCE GADOBENATE DIMEGLUMINE 529 MG/ML IV SOLN COMPARISON:  Multiple exams, including CT of 05/26/2018 and ultrasound dated 05/27/2018 FINDINGS: Lower chest: Small type 1 hiatal hernia. Hepatobiliary: The tiny gallbladder polyp shown on ultrasound is not readily apparent on today's MRI, likely due to the small size of the lesion. 4.6 by 5.5 cm focus of faintly accentuated T2 signal in segment 7 of the liver does not demonstrate significant dropout of signal on out of phase images. This lesion demonstrates arterial phase mild hyperenhancement relative to the rest of  the liver, and mild hyperenhancement relative to the liver on later phase images including the 3 minutes images. No other liver lesions identified. Pancreas: 1.9 by 1.5 by 2.3 cm cystic lesion of the posterior pancreatic head, questionable continuity with the ventral pancreatic duct. No internal enhancement. This lesion has fluid signal intensity. Pancreas divisum noted. Spleen:  Unremarkable Adrenals/Urinary Tract:  Unremarkable Stomach/Bowel: Unremarkable Vascular/Lymphatic:  Aortoiliac atherosclerotic vascular disease. Other:  No supplemental non-categorized findings. Musculoskeletal: Mild lumbar spondylosis and degenerative disc disease. IMPRESSION: 1. 1.9 by 1.5 by 2.3 cm simple appearing cystic lesion along the posterior margin of pancreatic head, questionable continuity  with the dorsal pancreatic duct. Pancreas divisum is present and no pancreatic duct dilatation is observed. Possibilities may include pseudocyst or intraductal papillary mucinous neoplasm. Based on current guidelines, follow pancreatic protocol MRI is recommended in 6 months time. This recommendation follows ACR consensus guidelines: Management of Incidental Pancreatic Cysts: A White Paper of the ACR Incidental Findings Committee. J Am Coll Radiol 6659;93:570-177. 2. Unusual 5.5 cm lesion in segment 7 of the liver appears to have vessels coursing through it (suggesting an infiltrative process rather than a mass lesion) but demonstrates accentuated diffusion weighted signal, faintly increased T2 signal, and arterial and later phase enhancement. Possibilities may include confluent fibrosis, unusual hepatic steatosis with greater than 50% fat fraction (although this should not cause diffusion-weighted signal), or an infiltrative malignancy such as infiltrative hepatocellular carcinoma, cholangiocarcinoma, or lymphoma. The enhancement pattern could conceivably also be seen in [facility], but normally [place] would have mass effect on surrounding vessels rather than an infiltrative appearance. Because of the possibility of malignancy and size of the lesion, further workup is recommended. Possible further workups and areas would include biopsy, short-term repeat MRI using Eovist contrast medium (if the lesion takes up Eovist on delayed images it is likely benign), or nuclear medicine PET-CT. 3. Small type 1 hiatal hernia. 4.  Aortic Atherosclerosis (ICD10-I70.0). Electronically Signed   By: Van Clines M.D.   On: 06/10/2018 10:19   US Abdomen Complete  Result Date: 05/27/2018 CLINICAL DATA:  Pancreatitis, acute EXAM: ABDOMEN ULTRASOUND COMPLETE COMPARISON:  CT abdomen and pelvis May 17, 2018 FINDINGS: Gallbladder: Within the gallbladder, there is a 3 mm echogenic focus which does not move or shadow, a likely small polyp.  There are no echogenic foci in the gallbladder which move and shadow as is expected with gallstones. No gallbladder wall thickening or pericholecystic fluid. No sonographic Murphy sign noted by sonographer. Common bile duct: Diameter: 4 mm. No intrahepatic, common hepatic, or common bile duct dilatation. Liver: No focal lesion identified. Within normal limits in parenchymal echogenicity. Portal vein is patent on color Doppler imaging with normal direction of blood flow towards the liver. IVC: No abnormality visualized. Pancreas: There is a mildly complex cystic area in the region of the uncinate process of the pancreas measuring 2.7 x 1.7 x 1.9 cm. Pancreatic duct measures 3 mm, mildly enlarged. The remainder of the pancreas appears unremarkable. Pancreas does not overall appear edematous. Note that portions of the tail the pancreas are not well seen due to overlying gas. Spleen: Size and appearance within normal limits. Right Kidney: Length: 11.5 cm. Echogenicity within normal limits. No mass or hydronephrosis visualized. Left Kidney: Length: 11.6 cm. Echogenicity within normal limits. No mass or hydronephrosis visualized. Abdominal aorta: No aneurysm visualized. There is aortic atherosclerosis. Other findings: No demonstrable ascites. IMPRESSION: 1. Mildly complex cystic lesion arising from the uncinate process of the pancreas, similar to recent CT  examination. Questions small pseudocyst. Cystic pancreatic neoplasm is a differential consideration. Given this concern, MR of the pancreas pre and post-contrast to further evaluate is reasonable as was suggested on prior CT examination report. 2. Slightly prominent pancreatic duct which may be indicative of a degree of pancreatitis. No pancreatic edema evident. Note that portions of the tail the pancreas are obscured by gas. 3. Nonobstructing calculus mid left kidney. No other renal lesions evident. 4.  Aortic atherosclerosis. 5.  Study otherwise unremarkable. Aortic  Atherosclerosis (ICD10-I70.0). Electronically Signed   By: Lowella Grip III M.D.   On: 05/27/2018 09:35    Labs:  CBC: Recent Labs    09/08/17 0832 02/26/18 0853 05/11/18 1422 06/24/18 0554  WBC 3.5 3.6 6.7 4.7  HGB 14.0 14.1 13.6 14.4  HCT 41.4 39.8 39.2 40.4  PLT 147* 122* 142* 200    COAGS: Recent Labs    06/24/18 0554  INR 1.02    BMP: Recent Labs    09/08/17 0832 02/26/18 0853 05/11/18 1422 05/13/18 1501  NA 129* 141 132* 136  K 4.5 4.1 4.1 4.0  CL 90* 100 93* 97  CO2 23 24 21 24   GLUCOSE 116* 112* 118* 168*  BUN 9 12 15 14   CALCIUM 9.3 9.6 8.8 9.0  CREATININE 0.64 0.70 0.62 0.67  GFRNONAA 89 86 89 87  GFRAA 102 99 103 100    LIVER FUNCTION TESTS: Recent Labs    09/08/17 0832 02/26/18 0853 05/11/18 1422  BILITOT 0.7 0.4 0.8  AST 26 40 21  ALT 24 36* 21  ALKPHOS 29* 25* 25*  PROT 6.8 6.9 6.4  ALBUMIN 4.2 4.4 4.1    TUMOR MARKERS: No results for input(s): AFPTM, CEA, CA199, CHROMGRNA in the last 8760 hours.  Assessment and Plan:  Abd pain for few weeks Abnormal CT/US with pancreatic lesion MRI revealing Liver lesion Scheduled now for liver lesion biopsy Risks and benefits discussed with the patient including, but not limited to bleeding, infection, damage to adjacent structures or low yield requiring additional tests.  All of the patient's questions were answered, patient is agreeable to proceed. Consent signed and in chart.   Thank you for this interesting consult.  I greatly enjoyed meeting Christine Cantrell and look forward to participating in their care.  A copy of this report was sent to the requesting provider on this date.  Electronically Signed: Lavonia Drafts, PA-C 06/24/2018, 7:16 AM   I spent a total of  30 Minutes   in face to face in clinical consultation, greater than 50% of which was counseling/coordinating care for liver lesion bx

## 2018-06-24 NOTE — Telephone Encounter (Signed)
Printed and taken to Dr Lucretia Field nurse

## 2018-06-25 ENCOUNTER — Telehealth: Payer: Self-pay | Admitting: Family Medicine

## 2018-06-25 DIAGNOSIS — D8689 Sarcoidosis of other sites: Secondary | ICD-10-CM

## 2018-06-25 DIAGNOSIS — D869 Sarcoidosis, unspecified: Secondary | ICD-10-CM

## 2018-06-25 NOTE — Telephone Encounter (Signed)
Called and discussed pathology results with patient.   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   Refer to GI for their opinion, no direct Tx for now. Planning to repeat MRI for pancreatic cyst as recommended by radiology.   Laroy Apple, MD [facility] 06/25/2018, 5:19 PM

## 2018-06-29 ENCOUNTER — Telehealth: Payer: Self-pay

## 2018-06-29 ENCOUNTER — Encounter: Payer: Self-pay | Admitting: Gastroenterology

## 2018-06-29 ENCOUNTER — Telehealth: Payer: Self-pay | Admitting: Endocrinology

## 2018-06-29 NOTE — Telephone Encounter (Signed)
Please ask PCP about this

## 2018-06-29 NOTE — Telephone Encounter (Signed)
Got an appt with [facility] but wants to know if you think it's OK to see Dr Penelope Coop?

## 2018-06-29 NOTE — Telephone Encounter (Signed)
Patient aware.

## 2018-06-29 NOTE — Telephone Encounter (Signed)
Perfect, Dr. Penelope Coop is great. I appreciate his eval and opinion for this patient.   Laroy Apple, MD [facility] 06/29/2018, 12:47 PM

## 2018-06-29 NOTE — Telephone Encounter (Signed)
Patient is asking if you can recommend her a GI doctor for pancreatitis issues. Please advise

## 2018-06-30 NOTE — Telephone Encounter (Signed)
LVM stating that patient would need to refer to PCP.

## 2018-07-15 ENCOUNTER — Ambulatory Visit (INDEPENDENT_AMBULATORY_CARE_PROVIDER_SITE_OTHER): Payer: Medicare Other | Admitting: Family Medicine

## 2018-07-15 VITALS — BP 114/69 | HR 81 | Temp 97.5°F | Ht 66.0 in | Wt 162.0 lb

## 2018-07-15 DIAGNOSIS — H5213 Myopia, bilateral: Secondary | ICD-10-CM | POA: Diagnosis not present

## 2018-07-15 DIAGNOSIS — Z0000 Encounter for general adult medical examination without abnormal findings: Secondary | ICD-10-CM | POA: Diagnosis not present

## 2018-07-15 DIAGNOSIS — H52201 Unspecified astigmatism, right eye: Secondary | ICD-10-CM | POA: Diagnosis not present

## 2018-07-15 DIAGNOSIS — H524 Presbyopia: Secondary | ICD-10-CM | POA: Diagnosis not present

## 2018-07-15 NOTE — Patient Instructions (Signed)
  Christine Cantrell , Thank you for taking time to come for your Medicare Wellness Visit. I appreciate your ongoing commitment to your health goals. Please review the following plan we discussed and let me know if I can assist you in the future.   These are the goals we discussed: Goals    . Prevent falls     Stay active        This is a list of the screening recommended for you and due dates:  Health Maintenance  Topic Date Due  . Tetanus Vaccine  03/18/2019*  . Flu Shot  07/29/2018  . Mammogram  04/19/2020  . Colon Cancer Screening  03/27/2025  . DEXA scan (bone density measurement)  Completed  .  Hepatitis C: One time screening is recommended by Center for Disease Control  (CDC) for  adults born from [birth year] through 1967.   Completed  . Pneumonia vaccines  Completed  *Topic was postponed. The date shown is not the original due date.    Keep follow up with Dr Wendi Snipes and / or DR Lajuana Ripple. Stay active and do not put yourself at risk for falls

## 2018-07-15 NOTE — Progress Notes (Addendum)
Subjective:   Christine Cantrell is a 72 y.o. female who presents for Medicare Annual (Subsequent) preventive examination. 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 She moved here from [place] about 2 years ago. She enjoys reading, walking and yard work. For exercise, she walks daily. She states that her diet is healthy and she typically gets in 2-3 meals a day. She is active in the [place] and she lives at home by herself. She is not married and she has never had children. She also doesn't currently have any pets. Fall risks were discussed today. She states that her health is about the same as it was a year ago.   Cardiac Risk Factors include: advanced age (>50men, >59 women)     Objective:     Vitals: BP 114/69 (BP Location: Left Arm)   Pulse 81   Temp (!) 97.5 F (36.4 C) (Oral)   Ht 5\' 6"  (1.676 m)   Wt 162 lb (73.5 kg)   BMI 26.15 kg/m   Body mass index is 26.15 kg/m.  Advanced Directives 07/15/2018 06/24/2018 07/06/2015  Does Patient Have a Medical Advance Directive? No No No  Would patient like information on creating a medical advance directive? No - Patient declined No - Patient declined No - patient declined information    Tobacco Social History   Tobacco Use  Smoking Status Never Smoker  Smokeless Tobacco Never Used     Counseling given: Not Answered   Past Medical History:  Diagnosis Date  . Depression   . GERD (gastroesophageal reflux disease)   . High cholesterol   . Hypertension   . Thyroid disease    hypothyroidism   Past Surgical History:  Procedure Laterality Date  . ABDOMINAL HYSTERECTOMY  2002  . LIVER BIOPSY     normal result  . THYROIDECTOMY  1982   Family History  Problem Relation Age of Onset  . High Cholesterol Mother   . Hypertension Mother   . Goiter Mother   . High Cholesterol Father   . Hypertension Father   . Hypertension Brother   . Lung disease Brother        chronic smoker  . Heart disease Brother   . Goiter Sister   . Heart  disease Brother   . Hypertension Brother    Social History   Socioeconomic History  . Marital status: Single    Spouse name: Not on file  . Number of children: 0  . Years of education: Not on file  . Highest education level: Not on file  Occupational History  . Occupation: retired    Comment: Therapist, nutritional  Social Needs  . Financial resource strain: Not on file  . Food insecurity:    Worry: Not on file    Inability: Not on file  . Transportation needs:    Medical: Not on file    Non-medical: Not on file  Tobacco Use  . Smoking status: Never Smoker  . Smokeless tobacco: Never Used  Substance and Sexual Activity  . Alcohol use: Yes    Comment: occasionally  . Drug use: Never  . Sexual activity: Yes    Birth control/protection: Surgical  Lifestyle  . Physical activity:    Days per week: Not on file    Minutes per session: Not on file  . Stress: Not on file  Relationships  . Social connections:    Talks on phone: Not on file    Gets together: Not on file    Attends  religious service: Not on file    Active member of club or organization: Not on file    Attends meetings of clubs or organizations: Not on file    Relationship status: Not on file  Other Topics Concern  . Not on file  Social History Narrative   Lives alone.  No children    Outpatient Encounter Medications as of 07/15/2018  Medication Sig  . Coenzyme Q10 (COQ10) 200 MG CAPS Take 200 mg by mouth daily.  Marland Kitchen levothyroxine (SYNTHROID) 75 MCG tablet Take 1 tablet (75 mcg total) by mouth daily before breakfast.  . lisinopril (PRINIVIL,ZESTRIL) 10 MG tablet Take 1 tablet (10 mg total) by mouth daily. PLEASE CONTACT OFFICE FOR ADDITIONAL REFILLS FINAL ATTEMPT  . lisinopril-hydrochlorothiazide (PRINZIDE,ZESTORETIC) 20-12.5 MG tablet Take 1 tablet by mouth daily.  . Multiple Vitamin (MULTIVITAMIN) capsule Take 1 capsule by mouth daily.  . Omega 3 1000 MG CAPS Take 1,000 mg by mouth daily.   No  facility-administered encounter medications on file as of 07/15/2018.     Activities of Daily Living In your present state of health, do you have any difficulty performing the following activities: 07/15/2018 06/24/2018  Hearing? Y N  Comment right ear - slight loss  -  Vision? Y N  Comment wears rx glasses  -  Difficulty concentrating or making decisions? N N  Walking or climbing stairs? N N  Dressing or bathing? N N  Doing errands, shopping? N -  Preparing Food and eating ? N -  Using the Toilet? N -  In the past six months, have you accidently leaked urine? N -  Do you have problems with loss of bowel control? N -  Managing your Medications? N -  Managing your Finances? N -  Housekeeping or managing your Housekeeping? N -  Some recent data might be hidden    Patient Care Team: Timmothy Euler, MD as PCP - General (Family Medicine)    Assessment:   This is a routine wellness examination for Christine Cantrell.  Exercise Activities and Dietary recommendations Current Exercise Habits: Home exercise routine, Type of exercise: walking;Other - see comments(yardwork, bike ), Time (Minutes): 30, Frequency (Times/Week): 7, Weekly Exercise (Minutes/Week): 210, Intensity: Mild, Exercise limited by: None identified  Goals    . Prevent falls     Stay active        Fall Risk Fall Risk  07/15/2018 06/11/2018 05/28/2018 05/13/2018 05/11/2018  Falls in the past year? No No No No No  Number falls in past yr: - - - - -  Injury with Fall? - - - - -   Is the patient's home free of loose throw rugs in walkways, pet beds, electrical cords, etc?  Fall risks and hazards were discussed today.    Depression Screen PHQ 2/9 Scores 07/15/2018 06/11/2018 05/28/2018 05/13/2018  PHQ - 2 Score 0 0 0 0     Cognitive Function MMSE - Mini Mental State Exam 07/15/2018  Orientation to time 5  Orientation to Place 5  Registration 3  Attention/ Calculation 5  Recall 3  Language- name 2 objects 2  Language- repeat 1    Language- follow 3 step command 3  Language- read & follow direction 1  Write a sentence 1  Copy design 1  Total score 30        Immunization History  Administered Date(s) Administered  . Influenza-Unspecified 09/26/2015, 09/17/2016, 09/25/2017  . Pneumococcal Polysaccharide-23 09/25/2017  . Zoster Recombinat (Shingrix) 06/10/2017, 09/03/2017  Qualifies for Shingles Vaccine? She has taken these.  Screening Tests Health Maintenance  Topic Date Due  . TETANUS/TDAP  03/18/2019 (Originally 03/01/1965)  . INFLUENZA VACCINE  07/29/2018  . MAMMOGRAM  04/19/2020  . COLONOSCOPY  03/27/2025  . DEXA SCAN  Completed  . Hepatitis C Screening  Completed  . PNA vac Low Risk Adult  Completed    Cancer Screenings: Lung: Low Dose CT Chest recommended if Age 21-80 years, 30 pack-year currently smoking OR have quit w/in 15years. Patient does qualify. Breast:  Up to date on Mammogram? Yes   Up to date of Bone Density/Dexa? No Colorectal: due at next OV  Additional Screenings: she has had Hep C sreening     Plan:   pt is to keep follow up with Dr Wendi Snipes and/or Dr Lajuana Ripple.  She is aware that she may need CXR, EKG and that she is due for DEXA at this time. She wishes to discuss this at Sept, 2019 appt.  She will also be due a Prevnar after 09/25/18.  I have personally reviewed and noted the following in the patient's chart:   . Medical and social history . Use of alcohol, tobacco or illicit drugs  . Current medications and supplements . Functional ability and status . Nutritional status . Physical activity . Advanced directives . List of other physicians . Hospitalizations, surgeries, and ER visits in previous 12 months . Vitals . Screenings to include cognitive, depression, and falls . Referrals and appointments  In addition, I have reviewed and discussed with patient certain preventive protocols, quality metrics, and best practice recommendations. A written personalized  care plan for preventive services as well as general preventive health recommendations were provided to patient.     Eshawn Coor, Cameron Proud, LPN  02/14/4714  I have reviewed and agree with the above AWV documentation.   Laroy Apple, MD [facility] 07/15/2018, 8:47 AM

## 2018-07-20 ENCOUNTER — Telehealth: Payer: Self-pay | Admitting: Family Medicine

## 2018-07-20 NOTE — Telephone Encounter (Signed)
Ok for pt to give blood in my opinion, I do not see any reason not to.   Laroy Apple, MD [facility] 07/20/2018, 2:37 PM

## 2018-07-20 NOTE — Telephone Encounter (Signed)
Patient aware.

## 2018-09-01 ENCOUNTER — Other Ambulatory Visit (INDEPENDENT_AMBULATORY_CARE_PROVIDER_SITE_OTHER): Payer: Medicare Other

## 2018-09-01 ENCOUNTER — Ambulatory Visit (INDEPENDENT_AMBULATORY_CARE_PROVIDER_SITE_OTHER)
Admission: RE | Admit: 2018-09-01 | Discharge: 2018-09-01 | Disposition: A | Discharge status: Home / Self Care | Payer: Medicare Other | Source: Ambulatory Visit | Attending: Gastroenterology | Admitting: Gastroenterology

## 2018-09-01 ENCOUNTER — Encounter: Payer: Self-pay | Admitting: Gastroenterology

## 2018-09-01 ENCOUNTER — Ambulatory Visit: Payer: Medicare Other | Admitting: Gastroenterology

## 2018-09-01 VITALS — BP 136/72 | HR 86 | Ht 66.0 in | Wt 163.0 lb

## 2018-09-01 DIAGNOSIS — K862 Cyst of pancreas: Secondary | ICD-10-CM

## 2018-09-01 DIAGNOSIS — Z860100 Personal history of colon polyps, unspecified: Secondary | ICD-10-CM

## 2018-09-01 DIAGNOSIS — R932 Abnormal findings on diagnostic imaging of liver and biliary tract: Secondary | ICD-10-CM | POA: Diagnosis not present

## 2018-09-01 DIAGNOSIS — Z8507 Personal history of malignant neoplasm of pancreas: Secondary | ICD-10-CM | POA: Diagnosis not present

## 2018-09-01 LAB — HEPATIC FUNCTION PANEL
ALBUMIN: 4.3 g/dL (ref 3.5–5.2)
ALK PHOS: 33 U/L — AB (ref 39–117)
ALT: 26 U/L (ref 0–35)
AST: 20 U/L (ref 0–37)
Bilirubin, Direct: 0.1 mg/dL (ref 0.0–0.3)
TOTAL PROTEIN: 7.2 g/dL (ref 6.0–8.3)
Total Bilirubin: 0.8 mg/dL (ref 0.2–1.2)

## 2018-09-01 NOTE — Patient Instructions (Addendum)
If you are age 74 or older, your body mass index should be between 23-30. Your Body mass index is 26.31 kg/m. If this is out of the aforementioned range listed, please consider follow up with your Primary Care Provider.  If you are age 20 or younger, your body mass index should be between 19-25. Your Body mass index is 26.31 kg/m. If this is out of the aformentioned range listed, please consider follow up with your Primary Care Provider.   Please go to the lab in the basement of our building to have lab work done as you leave today.  Please go to the lab in the basement of our building to have a chest Xray done as you leave today.  You will be due for your next colonoscopy in March of 2021.  We will let you know when it is time to schedule that.   Thank you for entrusting me with your care and for choosing [facility], Dr. [place] Cellar

## 2018-09-01 NOTE — Progress Notes (Signed)
HPI :  72 year old female with a history of colon polyps, HTN, hypothyroidism, referred here by Dr. Wendi Snipes, Sherley Bounds, for pancreatic cyst and abnormal liver imaging.  She reported she had been dealing with abdominal pain this past spring. Her pain was intermittent was periumbilical and radiated to her breast at times. She had a severe episode that lasted 12 hours and then resolved on its own. Lipase was in the 500s while LFTs have remained normal. She had a workup including a CT scan which showed a pancreatic head cyst, this led to an ultrasound which showed the pancreatic lesion and a follow-up MRI was recommended. MRI showed the pancreatic cyst, measuring 1.9 x 1.5 x 2.3 cm in size, and it also was remarkable for a 5.5 cm liver lesion. Results of these exams have been listed as belowin detail. 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  The patient denies any abdominal pains at this time. She is eating well, she denies any nausea or vomiting. She did have a period of some loss of appetite and had lost 10 pounds at one period of time, however her appetite is since come back in her appetite and stable over the past few months. She denies any bowel issues that bother her at all. She has had 2 nephews who have had pancreatic cancer. She denies any other family history of pancreatic cancer. She denies any family history of liver disease or cirrhosis. Brother had prostate cancer. She's had an upper endoscopy and colonoscopy   Prior workup: CT 05/17/18 - 18 mm cystic lesion projecting off the pancreatic head, Advanced atherosclerotic calcifications involving the aorta and iliac arteries.  [place] 05/27/18  -Mildly complex cystic lesion arising from the uncinate process of the pancreas, similar to recent CT examination. Slightly prominent pancreatic duct   MRI abdomen 06/10/18  - 1.9 by 1.5 by  2.3 cm simple appearing cystic lesion along the posterior margin of pancreatic head, questionable continuity with the dorsal pancreatic duct. Pancreas divisum is present and no pancreatic duct dilatation is observed. Possibilities may include pseudocyst or intraductal papillary mucinous neoplasm. Unusual 5.5 cm lesion in segment 7 of the liver appears to have vessels coursing through it (suggesting an infiltrative process rather than a mass lesion) but demonstrates accentuated diffusion weighted signal, faintly increased T2 signal, and arterial and later phase enhancement. Possibilities may include confluent fibrosis, unusual hepatic steatosis with greater than 50% fat fraction (although this should not cause diffusion-weighted signal), or an infiltrative malignancy such as infiltrative hepatocellular carcinoma, cholangiocarcinoma, or lymphoma. The enhancement pattern could conceivably also be seen in [facility], but normally [place] would have mass effect on surrounding vessels rather than an infiltrative appearance. Because of the possibility of malignancy and size of the lesion, further workup is recommended.   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 SEE NOTE. - NO FEATURES OF CHRONIC HEPATITIS OR A BILIARY DISORDER. - MINIMAL STEATOSIS WITHOUT EVIDENCE OF STEATOHEPATITIS. - NEGATIVE FOR MALIGNANCY.   Dr. Brunetta Genera [facility] [place] EGD 03/28/2015 - small HH, no BE, mild gastritis / duodenitis - biopsies taken - preserved duodenal architecture with moderate numbers of lymphocytes - additional testing negative for celiac and lymphoproliferative disorder, H pylori negative Colonoscopy 03/28/2015 - 47mm descending colon polyp - adenoma, 83mm rectosigmoid polyp - hyperplastic, diverticulosis of left colon, random biopsies taken to rule out  microscopic colitis, good prep -    Past Medical History:  Diagnosis  Date  . Depression   . Diverticulosis large intestine w/o perforation or abscess w/bleeding 2016  . GERD (gastroesophageal reflux disease)   . High cholesterol   . Hx of colonic polyps 2016   adenomatous  . Hypertension   . Thyroid disease    hypothyroidism     Past Surgical History:  Procedure Laterality Date  . ABDOMINAL HYSTERECTOMY  2002  . LIVER BIOPSY     normal result  . THYROIDECTOMY  1982   Family History  Problem Relation Age of Onset  . High Cholesterol Mother   . Hypertension Mother   . Goiter Mother   . High Cholesterol Father   . Hypertension Father   . Hypertension Brother   . Lung disease Brother        chronic smoker  . Heart disease Brother   . Goiter Sister   . Heart disease Brother   . Hypertension Brother   . Prostate cancer Brother   . Colon cancer Neg Hx   . Stomach cancer Neg Hx   . Pancreatic cancer Neg Hx    Social History   Tobacco Use  . Smoking status: Never Smoker  . Smokeless tobacco: Never Used  Substance Use Topics  . Alcohol use: Yes    Comment: occasionally  . Drug use: Never   Current Outpatient Medications  Medication Sig Dispense Refill  . Coenzyme Q10 (COQ10) 200 MG CAPS Take 200 mg by mouth daily.    Marland Kitchen levothyroxine (SYNTHROID) 75 MCG tablet Take 1 tablet (75 mcg total) by mouth daily before breakfast. 90 tablet 3  . lisinopril (PRINIVIL,ZESTRIL) 10 MG tablet Take 1 tablet (10 mg total) by mouth daily. PLEASE CONTACT OFFICE FOR ADDITIONAL REFILLS FINAL ATTEMPT 90 tablet 3  . lisinopril-hydrochlorothiazide (PRINZIDE,ZESTORETIC) 20-12.5 MG tablet Take 1 tablet by mouth daily. 90 tablet 3  . Multiple Vitamin (MULTIVITAMIN) capsule Take 1 capsule by mouth daily.    . Omega 3 1000 MG CAPS Take 1,000 mg by mouth daily.     No current facility-administered medications for this visit.    No Known Allergies   Review of Systems: All systems reviewed and negative except where noted in HPI.   Lab Results  Component Value  Date   ALT 26 09/01/2018   AST 20 09/01/2018   ALKPHOS 33 (L) 09/01/2018   BILITOT 0.8 09/01/2018    Lab Results  Component Value Date   WBC 4.7 06/24/2018   HGB 14.4 06/24/2018   HCT 40.4 06/24/2018   MCV 88.8 06/24/2018   PLT 200 06/24/2018    Lab Results  Component Value Date   CREATININE 0.67 05/13/2018   BUN 14 05/13/2018   NA 136 05/13/2018   K 4.0 05/13/2018   CL 97 05/13/2018   CO2 24 05/13/2018     Physical Exam: BP 136/72   Pulse 86   Ht 5\' 6"  (1.676 m)   Wt 163 lb (73.9 kg)   BMI 26.31 kg/m  Constitutional: Pleasant,well-developed, female in no acute distress. HEENT: Normocephalic and atraumatic. Conjunctivae are normal. No scleral icterus. Neck supple.  Cardiovascular: Normal rate, regular rhythm.  Pulmonary/chest: Effort normal and breath sounds normal. No wheezing, rales or rhonchi. Abdominal: Soft, nondistended, nontender.  There are no masses palpable.  Extremities: no edema Lymphadenopathy: No cervical adenopathy noted. Neurological: Alert and oriented to person place and time. Skin: Skin is warm and dry. No rashes noted. Psychiatric:  Normal mood and affect. Behavior is normal.   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 I discussed the differential of pancreatic cysts with her, could be IPMN. Given the size of this lesion noted on MRI, I'm going to discuss this case with my colleagues who cover advanced endoscopy to discuss EUS with FNA to further evaluate. I discussed what EUS with FNA is with her to include risks / benefits. She is agreeable to it if recommended. Once I hear back from my colleagues I will contact her with further recommendations.  Abnormal liver imaging - 5.5cm liver lesion incidentally noted on MRI. Biopsy shows granulomatous changes  most concerning for sarcoidosis per pathology. This is a bit unusual for a typical presentation of hepatic sarcoidosis that is truly the case (this is not typically a solitary change, but more diffuse). Biopsies negative for AFB and fungus. Her ALT is normal. She has no history of liver disease. Given the pathology report will check a chest x-ray to assess for pulmonary sarcoidosis. I will also repeat her LFTs, screening her for hepatitis and check an ACE level. Further recommendations pending the result, we may also consider interval imaging over time to assess for changes.  History of colon adenomas - due for surveillance colonoscopy March 2021, recall placed.  [place] Cellar, MD [facility]  CC: Timmothy Euler, MD

## 2018-09-01 NOTE — H&P (View-Only) (Signed)
HPI :  72 year old female with a history of colon polyps, HTN, hypothyroidism, referred here by Dr. Wendi Snipes, Sherley Bounds, for pancreatic cyst and abnormal liver imaging.  She reported she had been dealing with abdominal pain this past spring. Her pain was intermittent was periumbilical and radiated to her breast at times. She had a severe episode that lasted 12 hours and then resolved on its own. Lipase was in the 500s while LFTs have remained normal. She had a workup including a CT scan which showed a pancreatic head cyst, this led to an ultrasound which showed the pancreatic lesion and a follow-up MRI was recommended. MRI showed the pancreatic cyst, measuring 1.9 x 1.5 x 2.3 cm in size, and it also was remarkable for a 5.5 cm liver lesion. Results of these exams have been listed as belowin detail. 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  The patient denies any abdominal pains at this time. She is eating well, she denies any nausea or vomiting. She did have a period of some loss of appetite and had lost 10 pounds at one period of time, however her appetite is since come back in her appetite and stable over the past few months. She denies any bowel issues that bother her at all. She has had 2 nephews who have had pancreatic cancer. She denies any other family history of pancreatic cancer. She denies any family history of liver disease or cirrhosis. Brother had prostate cancer. She's had an upper endoscopy and colonoscopy   Prior workup: CT 05/17/18 - 18 mm cystic lesion projecting off the pancreatic head, Advanced atherosclerotic calcifications involving the aorta and iliac arteries.  [place] 05/27/18  -Mildly complex cystic lesion arising from the uncinate process of the pancreas, similar to recent CT examination. Slightly prominent pancreatic duct   MRI abdomen 06/10/18  - 1.9 by 1.5 by  2.3 cm simple appearing cystic lesion along the posterior margin of pancreatic head, questionable continuity with the dorsal pancreatic duct. Pancreas divisum is present and no pancreatic duct dilatation is observed. Possibilities may include pseudocyst or intraductal papillary mucinous neoplasm. Unusual 5.5 cm lesion in segment 7 of the liver appears to have vessels coursing through it (suggesting an infiltrative process rather than a mass lesion) but demonstrates accentuated diffusion weighted signal, faintly increased T2 signal, and arterial and later phase enhancement. Possibilities may include confluent fibrosis, unusual hepatic steatosis with greater than 50% fat fraction (although this should not cause diffusion-weighted signal), or an infiltrative malignancy such as infiltrative hepatocellular carcinoma, cholangiocarcinoma, or lymphoma. The enhancement pattern could conceivably also be seen in [facility], but normally [place] would have mass effect on surrounding vessels rather than an infiltrative appearance. Because of the possibility of malignancy and size of the lesion, further workup is recommended.   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 SEE NOTE. - NO FEATURES OF CHRONIC HEPATITIS OR A BILIARY DISORDER. - MINIMAL STEATOSIS WITHOUT EVIDENCE OF STEATOHEPATITIS. - NEGATIVE FOR MALIGNANCY.   Dr. Brunetta Genera [facility] EGD 03/28/2015 - small HH, no BE, mild gastritis / duodenitis - biopsies taken - preserved duodenal architecture with moderate numbers of lymphocytes - additional testing negative for celiac and lymphoproliferative disorder, H pylori negative Colonoscopy 03/28/2015 - 31mm descending colon polyp - adenoma, 81mm rectosigmoid polyp - hyperplastic, diverticulosis of left colon, random biopsies taken to rule out  microscopic colitis, good prep -    Past Medical History:  Diagnosis  Date  . Depression   . Diverticulosis large intestine w/o perforation or abscess w/bleeding 2016  . GERD (gastroesophageal reflux disease)   . High cholesterol   . Hx of colonic polyps 2016   adenomatous  . Hypertension   . Thyroid disease    hypothyroidism     Past Surgical History:  Procedure Laterality Date  . ABDOMINAL HYSTERECTOMY  2002  . LIVER BIOPSY     normal result  . THYROIDECTOMY  1982   Family History  Problem Relation Age of Onset  . High Cholesterol Mother   . Hypertension Mother   . Goiter Mother   . High Cholesterol Father   . Hypertension Father   . Hypertension Brother   . Lung disease Brother        chronic smoker  . Heart disease Brother   . Goiter Sister   . Heart disease Brother   . Hypertension Brother   . Prostate cancer Brother   . Colon cancer Neg Hx   . Stomach cancer Neg Hx   . Pancreatic cancer Neg Hx    Social History   Tobacco Use  . Smoking status: Never Smoker  . Smokeless tobacco: Never Used  Substance Use Topics  . Alcohol use: Yes    Comment: occasionally  . Drug use: Never   Current Outpatient Medications  Medication Sig Dispense Refill  . Coenzyme Q10 (COQ10) 200 MG CAPS Take 200 mg by mouth daily.    Marland Kitchen levothyroxine (SYNTHROID) 75 MCG tablet Take 1 tablet (75 mcg total) by mouth daily before breakfast. 90 tablet 3  . lisinopril (PRINIVIL,ZESTRIL) 10 MG tablet Take 1 tablet (10 mg total) by mouth daily. PLEASE CONTACT OFFICE FOR ADDITIONAL REFILLS FINAL ATTEMPT 90 tablet 3  . lisinopril-hydrochlorothiazide (PRINZIDE,ZESTORETIC) 20-12.5 MG tablet Take 1 tablet by mouth daily. 90 tablet 3  . Multiple Vitamin (MULTIVITAMIN) capsule Take 1 capsule by mouth daily.    . Omega 3 1000 MG CAPS Take 1,000 mg by mouth daily.     No current facility-administered medications for this visit.    No Known Allergies   Review of Systems: All systems reviewed and negative except where noted in HPI.   Lab Results  Component Value  Date   ALT 26 09/01/2018   AST 20 09/01/2018   ALKPHOS 33 (L) 09/01/2018   BILITOT 0.8 09/01/2018    Lab Results  Component Value Date   WBC 4.7 06/24/2018   HGB 14.4 06/24/2018   HCT 40.4 06/24/2018   MCV 88.8 06/24/2018   PLT 200 06/24/2018    Lab Results  Component Value Date   CREATININE 0.67 05/13/2018   BUN 14 05/13/2018   NA 136 05/13/2018   K 4.0 05/13/2018   CL 97 05/13/2018   CO2 24 05/13/2018     Physical Exam: BP 136/72   Pulse 86   Ht 5\' 6"  (1.676 m)   Wt 163 lb (73.9 kg)   BMI 26.31 kg/m  Constitutional: Pleasant,well-developed, female in no acute distress. HEENT: Normocephalic and atraumatic. Conjunctivae are normal. No scleral icterus. Neck supple.  Cardiovascular: Normal rate, regular rhythm.  Pulmonary/chest: Effort normal and breath sounds normal. No wheezing, rales or rhonchi. Abdominal: Soft, nondistended, nontender.  There are no masses palpable.  Extremities: no edema Lymphadenopathy: No cervical adenopathy noted. Neurological: Alert and oriented to person place and time. Skin: Skin is warm and dry. No rashes noted. Psychiatric:  Normal mood and affect. Behavior is normal.   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 I discussed the differential of pancreatic cysts with her, could be IPMN. Given the size of this lesion noted on MRI, I'm going to discuss this case with my colleagues who cover advanced endoscopy to discuss EUS with FNA to further evaluate. I discussed what EUS with FNA is with her to include risks / benefits. She is agreeable to it if recommended. Once I hear back from my colleagues I will contact her with further recommendations.  Abnormal liver imaging - 5.5cm liver lesion incidentally noted on MRI. Biopsy shows granulomatous changes  most concerning for sarcoidosis per pathology. This is a bit unusual for a typical presentation of hepatic sarcoidosis that is truly the case (this is not typically a solitary change, but more diffuse). Biopsies negative for AFB and fungus. Her ALT is normal. She has no history of liver disease. Given the pathology report will check a chest x-ray to assess for pulmonary sarcoidosis. I will also repeat her LFTs, screening her for hepatitis and check an ACE level. Further recommendations pending the result, we may also consider interval imaging over time to assess for changes.  History of colon adenomas - due for surveillance colonoscopy March 2021, recall placed.  [place] Cellar, MD [facility]  CC: Timmothy Euler, MD

## 2018-09-02 LAB — ANGIOTENSIN CONVERTING ENZYME: Angiotensin-Converting Enzyme: 10 U/L (ref 9–67)

## 2018-09-02 LAB — HEPATITIS C ANTIBODY
HEP C AB: NONREACTIVE
SIGNAL TO CUT-OFF: 0.01 (ref <1.00)

## 2018-09-02 LAB — HEPATITIS B SURFACE ANTIGEN: HEP B S AG: NONREACTIVE

## 2018-09-03 ENCOUNTER — Other Ambulatory Visit: Payer: Self-pay

## 2018-09-03 DIAGNOSIS — K862 Cyst of pancreas: Secondary | ICD-10-CM

## 2018-09-06 ENCOUNTER — Telehealth: Payer: Self-pay | Admitting: Gastroenterology

## 2018-09-06 NOTE — Telephone Encounter (Signed)
Left message for patient that she can go ahead and get the flu shot, would not do it the same day as her CT. If she has further questions asked her to call back.

## 2018-09-07 ENCOUNTER — Other Ambulatory Visit: Payer: Self-pay | Admitting: Family Medicine

## 2018-09-07 ENCOUNTER — Telehealth: Payer: Self-pay | Admitting: Family Medicine

## 2018-09-07 DIAGNOSIS — I10 Essential (primary) hypertension: Secondary | ICD-10-CM

## 2018-09-07 DIAGNOSIS — K862 Cyst of pancreas: Secondary | ICD-10-CM | POA: Insufficient documentation

## 2018-09-07 DIAGNOSIS — K869 Disease of pancreas, unspecified: Secondary | ICD-10-CM

## 2018-09-07 DIAGNOSIS — E039 Hypothyroidism, unspecified: Secondary | ICD-10-CM

## 2018-09-07 NOTE — Telephone Encounter (Signed)
Disregard.  I see that she has hx of pancreatic lesion that is being worked up.  Labs placed.

## 2018-09-07 NOTE — Telephone Encounter (Signed)
Duplicate

## 2018-09-07 NOTE — Telephone Encounter (Signed)
Patient aware that lab orders are in place

## 2018-09-07 NOTE — Telephone Encounter (Signed)
I can place order for metabolic panel and thyroid function.  What does she need Lipase for? This is only really used for pancreatitis.

## 2018-09-08 ENCOUNTER — Other Ambulatory Visit: Payer: Medicare Other

## 2018-09-08 DIAGNOSIS — I10 Essential (primary) hypertension: Secondary | ICD-10-CM

## 2018-09-08 DIAGNOSIS — E039 Hypothyroidism, unspecified: Secondary | ICD-10-CM | POA: Diagnosis not present

## 2018-09-08 DIAGNOSIS — K869 Disease of pancreas, unspecified: Secondary | ICD-10-CM

## 2018-09-09 LAB — THYROID PANEL WITH TSH
Free Thyroxine Index: 2 (ref 1.2–4.9)
T3 Uptake Ratio: 28 % (ref 24–39)
T4 TOTAL: 7.3 ug/dL (ref 4.5–12.0)
TSH: 0.323 u[IU]/mL — AB (ref 0.450–4.500)

## 2018-09-09 LAB — BASIC METABOLIC PANEL
BUN / CREAT RATIO: 12 (ref 12–28)
BUN: 8 mg/dL (ref 8–27)
CO2: 26 mmol/L (ref 20–29)
Calcium: 9.5 mg/dL (ref 8.7–10.3)
Chloride: 94 mmol/L — ABNORMAL LOW (ref 96–106)
Creatinine, Ser: 0.68 mg/dL (ref 0.57–1.00)
GFR calc Af Amer: 100 mL/min/{1.73_m2} (ref >59)
GFR calc non Af Amer: 86 mL/min/{1.73_m2} (ref >59)
GLUCOSE: 135 mg/dL — AB (ref 65–99)
Potassium: 4.3 mmol/L (ref 3.5–5.2)
SODIUM: 134 mmol/L (ref 134–144)

## 2018-09-09 LAB — HEPATIC FUNCTION PANEL
ALBUMIN: 4.2 g/dL (ref 3.5–4.8)
ALT: 22 IU/L (ref 0–32)
AST: 24 IU/L (ref 0–40)
Alkaline Phosphatase: 35 IU/L — ABNORMAL LOW (ref 39–117)
BILIRUBIN TOTAL: 0.8 mg/dL (ref 0.0–1.2)
Bilirubin, Direct: 0.22 mg/dL (ref 0.00–0.40)
TOTAL PROTEIN: 6.3 g/dL (ref 6.0–8.5)

## 2018-09-09 LAB — LIPASE: Lipase: 23 U/L (ref 14–85)

## 2018-09-13 ENCOUNTER — Ambulatory Visit
Admission: RE | Admit: 2018-09-13 | Discharge: 2018-09-13 | Disposition: A | Discharge status: Home / Self Care | Payer: Medicare Other | Source: Ambulatory Visit | Attending: Gastroenterology | Admitting: Gastroenterology

## 2018-09-13 DIAGNOSIS — K862 Cyst of pancreas: Secondary | ICD-10-CM

## 2018-09-13 MED ORDER — IOPAMIDOL (ISOVUE-300) INJECTION 61%
100.0000 mL | Freq: Once | INTRAVENOUS | Status: AC | PRN
  Administered 2018-09-13: 100 mL via INTRAVENOUS

## 2018-09-14 ENCOUNTER — Telehealth: Payer: Self-pay | Admitting: Gastroenterology

## 2018-09-15 ENCOUNTER — Telehealth: Payer: Self-pay

## 2018-09-15 DIAGNOSIS — K862 Cyst of pancreas: Secondary | ICD-10-CM

## 2018-09-15 NOTE — Telephone Encounter (Signed)
-----   Message from Milus Banister, MD sent at 09/15/2018 12:58 PM EDT ----- Regarding: RE: possible EUS Richardson Landry, I think it probably should be looked at with EUS, aspirated for cytology, CEA and amylase.  I doubt it is anything serious but she has a FH of pancreatic cancer, presented with unexplained pancreatitis (without clear etiology this raises question that the lesion is the cause of the pancreatitis rather then the result).  If you can let her know, I'll have Hobart Marte get in touch with her to schedule.  Lanasia Porras, She needs upper EUS, radial +/- linear, next available EUS with myself or Gabe for pancreatic cyst/lesion.  Thanks  Wynetta Fines   ----- Message ----- From: Yetta Flock, MD Sent: 09/15/2018  12:25 PM EDT To: Milus Banister, MD, # Subject: RE: possible EUS                               Hi guys,  CT scan came back on this patient. Spoke with radiologist, cyst measures about 2.6cm in max diameter, about the same as previous. Do you think EUS is reasonable at this point? Thanks for your help.  Richardson Landry  ----- Message ----- From: Irving Copas., MD Sent: 09/02/2018   4:06 AM EDT To: Milus Banister, MD, Yetta Flock, MD Subject: RE: possible EUS                               Richardson Landry, I think it would be reasonable to approach this with a patient who is clinically stable and no longer having issues with repeating cross-sectional imaging first. In the setting of having had,possible pancreatitis with lipase elevations as such and an atypical pain, I do agree it would be weird to not see overt pancreatitis/edema in the region on imaging, but as we know each person can have changes in the pancreas differently. If the lesion remains present at the same size or larger, I think EUS would be reasonable to remove the possibility of an BD-IPMN. It has been almost 71-months since the lesion was last seen, so I think I would repeat imaging. I think a CT-Abdomen with contrast  would probably be reasonable rather than another MRI. I would repeat the imaging within the next month and then from there you can let us know the findings and we can schedule EUS thereafter if necessary. If much smaller, then I would plan for follow up imaging. Hope that helps. Gabe ----- Message ----- From: Yetta Flock, MD Sent: 09/01/2018   5:28 PM EDT To: Milus Banister, MD, # Subject: possible EUS                                   Hey guys,  I saw a lady today who has a 2.3 cm cyst coming off the pancreatic head noted on MRI. She may have had an episode of pancreatitis associated with it. Given it's size, wondering if you thought EUS with FNA would be reasonable now, versus close follow up with MRI. Incidentally she also has a 5cm liver lesion which was biopsied and showed granulomatous change only which was a bit odd.  Thanks for your help. Richardson Landry

## 2018-09-16 ENCOUNTER — Other Ambulatory Visit: Payer: Self-pay

## 2018-09-16 DIAGNOSIS — K862 Cyst of pancreas: Secondary | ICD-10-CM

## 2018-09-16 NOTE — Telephone Encounter (Signed)
Instructions sent via My Chart need to call pt

## 2018-09-16 NOTE — Telephone Encounter (Signed)
EUS [facility] Dr Lenna Sciara 10/3 1030 am pancreatic lesion

## 2018-09-16 NOTE — Telephone Encounter (Signed)
Left message on machine to call back  

## 2018-09-17 ENCOUNTER — Ambulatory Visit: Payer: Medicare Other | Admitting: Family Medicine

## 2018-09-17 VITALS — BP 135/74 | HR 89 | Temp 97.6°F | Ht 66.0 in | Wt 163.0 lb

## 2018-09-17 DIAGNOSIS — K869 Disease of pancreas, unspecified: Secondary | ICD-10-CM

## 2018-09-17 DIAGNOSIS — E890 Postprocedural hypothyroidism: Secondary | ICD-10-CM | POA: Diagnosis not present

## 2018-09-17 DIAGNOSIS — R7301 Impaired fasting glucose: Secondary | ICD-10-CM

## 2018-09-17 DIAGNOSIS — I10 Essential (primary) hypertension: Secondary | ICD-10-CM

## 2018-09-17 NOTE — Patient Instructions (Signed)
Your thyroid function study was abnormal.  It is showing that you are overtreated at this point.   This may indicate that the rest of the thyroid needs to be removed.  I would like you to schedule an office visit with Dr. Loanne Drilling as soon as possible.

## 2018-09-17 NOTE — Telephone Encounter (Signed)
EUS scheduled, pt instructed and medications reviewed.  Patient instructions mailed to home.  Patient to call with any questions or concerns.  

## 2018-09-17 NOTE — Progress Notes (Signed)
Subjective: CC: hypothyroidism PCP: Christine Norlander, Christine Cantrell NWG:Christine Cantrell is a 72 y.o. female presenting to clinic today for:  1. Hypothyroidism Patient hypothyroid since partial thyroidectomy several years ago.  She is currently followed by Dr. Renato Shin with [facility] endocrinology, who performs interval thyroid ultrasounds for thyroid nodule surveillance.  Last ultrasound was in 2018.  Last office visit with Dr. Loanne Drilling was in March of this year.  She is noted to have 2 palpable thyroid nodules one on the left and one on the right.  Patient reports that she has been doing well.  Denies any heart palpitations, heat or cold intolerance, diarrhea or constipation, difficulty swallowing or change in voice.  She reports compliance with her Synthroid dose.  2.  Hypertension Patient reports good control blood pressures at home.  She takes her lisinopril hydrochlorothiazide tablet in the morning and her plain lisinopril tablet in the afternoons.  Denies any chest pain, shortness of breath.  3.  Pancreatic lesion Patient reports that this is being followed very closely by her specialist.  There is plans for an endoscopic ultrasound of the pancreas coming up soon.  She is somewhat reluctant to have this done as she is not ready to have a biopsy if needed at this point.  She wonders what an endoscopic ultrasound would entail.  She worries about bleeding risk.  No nausea, vomiting, abdominal pain.  No diarrhea.  4.  Elevated blood sugar Patient reports that she indeed was fasting during last labs.  She notes that she is been consuming quite a bit of ice cream and did not realize how much sugar was in the ice cream.  She has been totally abstaining since that time.  Pancreatic lesion as above.  No nausea, vomiting or abdominal pain.   ROS: Per HPI  No Known Allergies Past Medical History:  Diagnosis Date  . Depression   . Diverticulosis large intestine w/o perforation or abscess w/bleeding 2016   . GERD (gastroesophageal reflux disease)   . High cholesterol   . Hx of colonic polyps 2016   adenomatous  . Hypertension   . Thyroid disease    hypothyroidism    Current Outpatient Medications:  .  Coenzyme Q10 (COQ10) 200 MG CAPS, Take 200 mg by mouth daily., Disp: , Rfl:  .  levothyroxine (SYNTHROID) 75 MCG tablet, Take 1 tablet (75 mcg total) by mouth daily before breakfast., Disp: 90 tablet, Rfl: 3 .  lisinopril (PRINIVIL,ZESTRIL) 10 MG tablet, Take 1 tablet (10 mg total) by mouth daily. PLEASE CONTACT OFFICE FOR ADDITIONAL REFILLS FINAL ATTEMPT, Disp: 90 tablet, Rfl: 3 .  lisinopril-hydrochlorothiazide (PRINZIDE,ZESTORETIC) 20-12.5 MG tablet, Take 1 tablet by mouth daily., Disp: 90 tablet, Rfl: 3 .  Multiple Vitamin (MULTIVITAMIN) capsule, Take 1 capsule by mouth daily., Disp: , Rfl:  .  Omega 3 1000 MG CAPS, Take 1,000 mg by mouth daily., Disp: , Rfl:  Social History   Socioeconomic History  . Marital status: Single    Spouse name: Not on file  . Number of children: 0  . Years of education: Not on file  . Highest education level: Not on file  Occupational History  . Occupation: retired    Comment: Therapist, nutritional  Social Needs  . Financial resource strain: Not on file  . Food insecurity:    Worry: Not on file    Inability: Not on file  . Transportation needs:    Medical: Not on file    Non-medical: Not on file  Tobacco Use  . Smoking status: Never Smoker  . Smokeless tobacco: Never Used  Substance and Sexual Activity  . Alcohol use: Yes    Comment: occasionally  . Drug use: Never  . Sexual activity: Yes    Partners: Male    Birth control/protection: Surgical  Lifestyle  . Physical activity:    Days per week: Not on file    Minutes per session: Not on file  . Stress: Not on file  Relationships  . Social connections:    Talks on phone: Not on file    Gets together: Not on file    Attends religious service: Not on file    Active member of club or  organization: Not on file    Attends meetings of clubs or organizations: Not on file    Relationship status: Not on file  . Intimate partner violence:    Fear of current or ex partner: Not on file    Emotionally abused: Not on file    Physically abused: Not on file    Forced sexual activity: Not on file  Other Topics Concern  . Not on file  Social History Narrative   Lives alone.  No children   Family History  Problem Relation Age of Onset  . High Cholesterol Mother   . Hypertension Mother   . Goiter Mother   . High Cholesterol Father   . Hypertension Father   . Hypertension Brother   . Lung disease Brother        chronic smoker  . Heart disease Brother   . Goiter Sister   . Heart disease Brother   . Hypertension Brother   . Prostate cancer Brother   . Colon cancer Neg Hx   . Stomach cancer Neg Hx   . Pancreatic cancer Neg Hx     Objective: Office vital signs reviewed. BP 135/74   Pulse 89   Temp 97.6 F (36.4 C) (Oral)   Ht 5\' 6"  (1.676 m)   Wt 163 lb (73.9 kg)   BMI 26.31 kg/m   Physical Examination:  General: Awake, alert, well nourished, No acute distress HEENT: Normal    Neck: No masses palpated. No lymphadenopathy; palpable nodule on the left and right side of the thyroid.  No goiter noted.    Eyes: extraocular membranes intact, sclera white, no exophthalmos    Throat: moist mucus membranes Cardio: regular rate and rhythm, S1S2 heard, no murmurs appreciated Pulm: clear to auscultation bilaterally, no wheezes, rhonchi or rales; normal work of breathing on room air Extremities: warm, well perfused, No edema, cyanosis or clubbing; +2 pulses bilaterally MSK: normal gait and station Skin: dry; intact; normal temperature Neuro: no resting tremor  Assessment/ Plan: 72 y.o. female   1. Essential hypertension Well-controlled on current regimen.  No changes.  2. Pancreatic lesion Being followed closely by her gastroenterologist.  We discussed endoscopic  ultrasound.  I did advise her to discuss this procedure further with her specialist, as it is difficult to tell thorough plans for biopsy during the ultrasound.  I suspect it is more for informational purposes.  3. Postoperative hypothyroidism Per her report history of total thyroidectomy but she does have visible thyroid tissue with nodules on ultrasound.  She is being followed closely by Dr. Loanne Drilling.  We reviewed her suppressed TSH during today's visit.  I Christine Cantrell want her to see the specialist again soon.  She likely needs reduction in the Synthroid and recheck of the nodules.  I advised her  not to wait until the March appointment.  We will also cc this chart to his office to see if perhaps they can get her in sooner.  Currently she is asymptomatic.  4. Elevated fasting blood sugar We discussed this during today's visit.  Consumption of ice cream may have been playing a part.  We will plan to follow-up on this.  Would be interested if the pancreatic lesion is contributing to this finding.  Plan for A1c check at next visit.  We discussed carbohydrate reduction.   Christine Norlander, Christine Cantrell [place] [phone]

## 2018-09-21 ENCOUNTER — Telehealth: Payer: Self-pay | Admitting: Family Medicine

## 2018-09-21 ENCOUNTER — Other Ambulatory Visit: Payer: Self-pay | Admitting: Family Medicine

## 2018-09-21 MED ORDER — LEVOTHYROXINE SODIUM 50 MCG PO TABS: 50.0000 ug | ORAL_TABLET | Freq: Every day | ORAL | 0 refills | Status: DC

## 2018-09-21 NOTE — Progress Notes (Signed)
LMTCB

## 2018-09-21 NOTE — Progress Notes (Signed)
Please inform that I spoke to Dr Loanne Drilling and he recommends proceeding with reduced Synthroid dose.  57mcg tablets sent to pharmacy.  Follow up with endocrinology as directed.   Doshie Maggi M. Lajuana Ripple, [facility]

## 2018-09-22 ENCOUNTER — Telehealth: Payer: Self-pay | Admitting: Endocrinology

## 2018-09-22 NOTE — Telephone Encounter (Signed)
Patient's thyroid is overactive-per PCP-PCP changed thyroid medication to 50 mcg. It was 88 then changed to 75. Patient wants to discuss. Patient wants to make sure Dr. Loanne Drilling agrees with change in dosage. Please call patient at ph# [phone] to advise.

## 2018-09-22 NOTE — Telephone Encounter (Signed)
Pt aware of new dose.

## 2018-09-23 NOTE — Telephone Encounter (Signed)
Attempted to call pt back no vm

## 2018-09-23 NOTE — Telephone Encounter (Signed)
Please advise 

## 2018-09-23 NOTE — Telephone Encounter (Signed)
Yes, i'm glad she reduced it, based on the blood test.  I hope you feel well.

## 2018-09-24 NOTE — Telephone Encounter (Signed)
Pt returning call to nurse. Please call pt °

## 2018-09-28 ENCOUNTER — Encounter (HOSPITAL_COMMUNITY): Payer: Self-pay | Admitting: *Deleted

## 2018-09-28 ENCOUNTER — Other Ambulatory Visit: Payer: Self-pay

## 2018-09-30 ENCOUNTER — Encounter (HOSPITAL_COMMUNITY)
Admission: RE | Disposition: A | Discharge status: Home / Self Care | Payer: Self-pay | Source: Ambulatory Visit | Attending: Gastroenterology

## 2018-09-30 ENCOUNTER — Ambulatory Visit (HOSPITAL_COMMUNITY): Payer: Medicare Other | Admitting: Certified Registered"

## 2018-09-30 ENCOUNTER — Other Ambulatory Visit: Payer: Self-pay

## 2018-09-30 ENCOUNTER — Ambulatory Visit (HOSPITAL_COMMUNITY)
Admission: RE | Admit: 2018-09-30 | Discharge: 2018-09-30 | Disposition: A | Discharge status: Home / Self Care | Payer: Medicare Other | Source: Ambulatory Visit | Attending: Gastroenterology | Admitting: Gastroenterology

## 2018-09-30 ENCOUNTER — Encounter (HOSPITAL_COMMUNITY): Payer: Self-pay | Admitting: Certified Registered"

## 2018-09-30 DIAGNOSIS — K862 Cyst of pancreas: Secondary | ICD-10-CM | POA: Diagnosis not present

## 2018-09-30 DIAGNOSIS — Z79899 Other long term (current) drug therapy: Secondary | ICD-10-CM | POA: Insufficient documentation

## 2018-09-30 DIAGNOSIS — Z79890 Hormone replacement therapy (postmenopausal): Secondary | ICD-10-CM | POA: Insufficient documentation

## 2018-09-30 DIAGNOSIS — I10 Essential (primary) hypertension: Secondary | ICD-10-CM | POA: Diagnosis not present

## 2018-09-30 DIAGNOSIS — E890 Postprocedural hypothyroidism: Secondary | ICD-10-CM | POA: Diagnosis not present

## 2018-09-30 HISTORY — PX: EUS: SHX5427

## 2018-09-30 HISTORY — DX: Personal history of other diseases of the digestive system: Z87.19

## 2018-09-30 HISTORY — DX: Abnormal electrocardiogram (ECG) (EKG): R94.31

## 2018-09-30 HISTORY — PX: ESOPHAGOGASTRODUODENOSCOPY: SHX5428

## 2018-09-30 SURGERY — UPPER ENDOSCOPIC ULTRASOUND (EUS) RADIAL
Anesthesia: Monitor Anesthesia Care

## 2018-09-30 MED ORDER — PROPOFOL 500 MG/50ML IV EMUL
INTRAVENOUS | Status: DC | PRN
  Administered 2018-09-30: 125 ug/kg/min via INTRAVENOUS

## 2018-09-30 MED ORDER — PROPOFOL 10 MG/ML IV BOLUS
INTRAVENOUS | Status: DC | PRN
  Administered 2018-09-30 (×3): 20 mg via INTRAVENOUS
  Administered 2018-09-30: 40 mg via INTRAVENOUS
  Administered 2018-09-30 (×2): 50 mg via INTRAVENOUS
  Administered 2018-09-30 (×2): 20 mg via INTRAVENOUS

## 2018-09-30 MED ORDER — SODIUM CHLORIDE 0.9 % IV SOLN: INTRAVENOUS | Status: DC

## 2018-09-30 MED ORDER — LACTATED RINGERS IV SOLN
INTRAVENOUS | Status: DC
  Administered 2018-09-30: 09:00:00 via INTRAVENOUS

## 2018-09-30 MED ORDER — LIDOCAINE 2% (20 MG/ML) 5 ML SYRINGE
INTRAMUSCULAR | Status: DC | PRN
  Administered 2018-09-30: 75 mg via INTRAVENOUS

## 2018-09-30 MED ORDER — CIPROFLOXACIN IN D5W 400 MG/200ML IV SOLN
INTRAVENOUS | Status: AC
  Filled 2018-09-30: qty 200

## 2018-09-30 MED ORDER — PROPOFOL 10 MG/ML IV BOLUS
INTRAVENOUS | Status: AC
  Filled 2018-09-30: qty 20

## 2018-09-30 MED ORDER — CIPROFLOXACIN IN D5W 400 MG/200ML IV SOLN
400.0000 mg | Freq: Once | INTRAVENOUS | Status: AC
  Administered 2018-09-30: 200 mg via INTRAVENOUS

## 2018-09-30 MED ORDER — PROPOFOL 10 MG/ML IV BOLUS
INTRAVENOUS | Status: AC
  Filled 2018-09-30: qty 40

## 2018-09-30 NOTE — Anesthesia Procedure Notes (Signed)
Procedure Name: MAC Date/Time: 09/30/2018 9:41 AM Performed by: Lollie Sails, CRNA Pre-anesthesia Checklist: Patient identified, Emergency Drugs available, Suction available and Patient being monitored Oxygen Delivery Method: Nasal cannula

## 2018-09-30 NOTE — Anesthesia Preprocedure Evaluation (Signed)
Anesthesia Evaluation  Patient identified by MRN, date of birth, ID band Patient awake    Reviewed: Allergy & Precautions, NPO status , Patient's Chart, lab work & pertinent test results  Airway Mallampati: II  TM Distance: >3 FB Neck ROM: Full    Dental  (+) Teeth Intact, Dental Advisory Given   Pulmonary neg pulmonary ROS,    Pulmonary exam normal breath sounds clear to auscultation       Cardiovascular hypertension, Pt. on medications Normal cardiovascular exam+ dysrhythmias (LBBB)  Rhythm:Regular Rate:Normal     Neuro/Psych negative neurological ROS     GI/Hepatic Neg liver ROS, GERD  ,Pancreatic lesion    Endo/Other  Hypothyroidism   Renal/GU negative Renal ROS     Musculoskeletal negative musculoskeletal ROS (+)   Abdominal   Peds  Hematology negative hematology ROS (+)   Anesthesia Other Findings Day of surgery medications reviewed with the patient.  Reproductive/Obstetrics                             Anesthesia Physical Anesthesia Plan  ASA: III  Anesthesia Plan: MAC   Post-op Pain Management:    Induction: Intravenous  PONV Risk Score and Plan: 2 and Propofol infusion and Treatment may vary due to age or medical condition  Airway Management Planned: Natural Airway and Simple Face Mask  Additional Equipment:   Intra-op Plan:   Post-operative Plan:   Informed Consent: I have reviewed the patients History and Physical, chart, labs and discussed the procedure including the risks, benefits and alternatives for the proposed anesthesia with the patient or authorized representative who has indicated his/her understanding and acceptance.   Dental advisory given  Plan Discussed with: CRNA and Anesthesiologist  Anesthesia Plan Comments:         Anesthesia Quick Evaluation

## 2018-09-30 NOTE — Op Note (Signed)
[facility] ([place]) Patient Name: Christine Cantrell Procedure Date: 09/30/2018 MRN: [id number] Attending MD: Milus Banister , MD Date of Birth: [birth year] CSN: [id number] Age: 72 Admit Type: Outpatient Procedure:                Upper EUS Indications:              Pancreatic cyst on CT scan, MRI; Providers:                Milus Banister, MD, Cleda Daub, RN, Elspeth Cho Tech., Technician, Glenis Smoker, CRNA Referring MD:             Jolly Mango, MD Medicines:                Monitored Anesthesia Care, Cipro 170 mg IV Complications:            No immediate complications. Estimated blood loss:                            None. Estimated Blood Loss:     Estimated blood loss: none. Procedure:                Pre-Anesthesia Assessment:                           - Prior to the procedure, a History and Physical                            was performed, and patient medications and                            allergies were reviewed. The patient's tolerance of                            previous anesthesia was also reviewed. The risks                            and benefits of the procedure and the sedation                            options and risks were discussed with the patient.                            All questions were answered, and informed consent                            was obtained. Prior Anticoagulants: The patient has                            taken no previous anticoagulant or antiplatelet                            agents. ASA Grade Assessment: II - A patient with  mild systemic disease. After reviewing the risks                            and benefits, the patient was deemed in                            satisfactory condition to undergo the procedure.                           After obtaining informed consent, the endoscope was                            passed under direct vision. Throughout the                      procedure, the patient's blood pressure, pulse, and                            oxygen saturations were monitored continuously. The                            [id number] ([id number]) Olympus Radial EUS was                            introduced through the mouth, and advanced to the                            second part of duodenum. The [id number])                            Olympus Linear EUS was introduced through the                            mouth, and advanced to the second part of duodenum.                            The upper EUS was accomplished without difficulty.                            The patient tolerated the procedure well. Scope In: Scope Out: Findings:      ENDOSCOPIC FINDING: :      The examined esophagus was endoscopically normal.      The entire examined stomach was endoscopically normal.      The examined duodenum was endoscopically normal.      ENDOSONOGRAPHIC FINDING: :      1. An anechoic, multicystic and septated lesion suggestive of a cyst was       identified in the pancreatic head. It is not in obvious communication       with the pancreatic duct. The lesion measured 27 mm in maximal       cross-sectional diameter. There were a few compartments thinly septated.       There was no associated mass. There was no internal debris within the       fluid-filled cavity. There were numerous signficiant sized blood vessels       between  the cyst and the lumen of the GI tract. I could not find a safe       window to sample the cyst from either the duodenum or the stomach.      2. Main pancreatic duct is normal appearing. I cannot determine if the       cyst is in direct communication with the main pancreatic duct.      3. Pancreatic parenchyma is normal throughout, no masses or signs of       chronic pancreatitis.      4. No peripancreatic adenopathy.      5. CBD normal with apparent low cystic duct takeoff.      6. Limited views of the  liver, spleen, portal and splenic vessels were       all normal. 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 There were                            numerous signficiant sized blood vessels between                            the cyst and the lumen of the GI tract. I could not                            find a safe window to sample the cyst from either                            the duodenum or the stomach. Would repeat imaging                            with MRI/MRCP in 1 year (per 2015 AGA Guideline                            recommendations for incidental pancreatic cysts). Moderate Sedation:      N/A- Per Anesthesia Care Recommendation:           - Discharge patient to home (ambulatory). Procedure Code(s):        --- Professional ---                           [phone], Esophagogastroduodenoscopy, flexible,                            transoral; with endoscopic ultrasound examination                            limited to the esophagus, stomach or duodenum, and                            adjacent structures Diagnosis Code(s):        --- Professional ---  K86.2, Cyst of pancreas CPT copyright 2017 American Medical Association. All rights reserved. The codes documented in this report are preliminary and upon coder review may  be revised to meet current compliance requirements. Milus Banister, MD 09/30/2018 10:28:20 AM This report has been signed electronically. Number of Addenda: 0

## 2018-09-30 NOTE — Discharge Instructions (Signed)
Esophagogastroduodenoscopy, Care After °Refer to this sheet in the next few weeks. These instructions provide you with information about caring for yourself after your procedure. Your health care provider may also give you more specific instructions. Your treatment has been planned according to current medical practices, but problems sometimes occur. Call your health care provider if you have any problems or questions after your procedure. °What can I expect after the procedure? °After the procedure, it is common to have: °· A sore throat. °· Nausea. °· Bloating. °· Dizziness. °· Fatigue. ° °Follow these instructions at home: °· Do not eat or drink anything until the numbing medicine (local anesthetic) has worn off and your gag reflex has returned. You will know that the local anesthetic has worn off when you can swallow comfortably. °· Do not drive for 24 hours if you received a medicine to help you relax (sedative). °· If your health care provider took a tissue sample for testing during the procedure, make sure to get your test results. This is your responsibility. Ask your health care provider or the department performing the test when your results will be ready. °· Keep all follow-up visits as told by your health care provider. This is important. °Contact a health care provider if: °· You cannot stop coughing. °· You are not urinating. °· You are urinating less than usual. °Get help right away if: °· You have trouble swallowing. °· You cannot eat or drink. °· You have throat or chest pain that gets worse. °· You are dizzy or light-headed. °· You faint. °· You have nausea or vomiting. °· You have chills. °· You have a fever. °· You have severe abdominal pain. °· You have black, tarry, or bloody stools. °This information is not intended to replace advice given to you by your health care provider. Make sure you discuss any questions you have with your health care provider. °Document Released: 12/01/2012 Document  Revised: 05/22/2016 Document Reviewed: 11/08/2015 °Elsevier Interactive Patient Education © 2018 Elsevier Inc. ° °

## 2018-09-30 NOTE — Transfer of Care (Signed)
Immediate Anesthesia Transfer of Care Note  Patient: Christine Cantrell  Procedure(s) Performed: UPPER ENDOSCOPIC ULTRASOUND (EUS) RADIAL (N/A )  Patient Location: PACU and Endoscopy Unit  Anesthesia Type:MAC  Level of Consciousness: awake  Airway & Oxygen Therapy: Patient Spontanous Breathing and Patient connected to nasal cannula oxygen  Post-op Assessment: Report given to RN and Post -op Vital signs reviewed and stable  Post vital signs: Reviewed and stable  Last Vitals:  Vitals Value Taken Time  BP    Temp    Pulse    Resp    SpO2      Last Pain:  Vitals:   09/30/18 0905  TempSrc: Oral  PainSc: 0-No pain         Complications: No apparent anesthesia complications

## 2018-09-30 NOTE — Interval H&P Note (Signed)
History and Physical Interval Note:  09/30/2018 9:20 AM  Christine Cantrell  has presented today for surgery, with the diagnosis of pancreatic lesion  The various methods of treatment have been discussed with the patient and family. After consideration of risks, benefits and other options for treatment, the patient has consented to  Procedure(s): UPPER ENDOSCOPIC ULTRASOUND (EUS) RADIAL (N/A) as a surgical intervention .  The patient's history has been reviewed, patient examined, no change in status, stable for surgery.  I have reviewed the patient's chart and labs.  Questions were answered to the patient's satisfaction.     Milus Banister

## 2018-10-01 ENCOUNTER — Encounter (HOSPITAL_COMMUNITY): Payer: Self-pay | Admitting: Gastroenterology

## 2018-10-01 NOTE — Anesthesia Postprocedure Evaluation (Signed)
Anesthesia Post Note  Patient: Mariadelcarmen Corella  Procedure(s) Performed: UPPER ENDOSCOPIC ULTRASOUND (EUS) RADIAL (N/A )     Patient location during evaluation: PACU Anesthesia Type: MAC Level of consciousness: awake and alert, oriented and awake Pain management: pain level controlled Vital Signs Assessment: post-procedure vital signs reviewed and stable Respiratory status: spontaneous breathing, nonlabored ventilation and respiratory function stable Cardiovascular status: stable and blood pressure returned to baseline Postop Assessment: no apparent nausea or vomiting Anesthetic complications: no    Last Vitals:  Vitals:   09/30/18 1030 09/30/18 1040  BP: (!) 131/54 (!) 151/72  Pulse: (!) 59 60  Resp: 19 (!) 22  Temp:    SpO2: 100% 100%    Last Pain:  Vitals:   09/30/18 0905  TempSrc: Oral  PainSc: 0-No pain                 Catalina Gravel

## 2018-12-02 ENCOUNTER — Telehealth: Payer: Self-pay | Admitting: *Deleted

## 2018-12-02 MED ORDER — LEVOTHYROXINE SODIUM 50 MCG PO TABS: 50.0000 ug | ORAL_TABLET | Freq: Every day | ORAL | 1 refills | Status: DC

## 2018-12-02 NOTE — Telephone Encounter (Signed)
Pt needs RF on her Synthroid to [facility] [place], she does not see Dr. Lajuana Ripple or Dr. Loanne Drilling till March Pt aware refill sent to pharmacy

## 2018-12-31 ENCOUNTER — Telehealth: Payer: Self-pay | Admitting: Family Medicine

## 2018-12-31 NOTE — Telephone Encounter (Signed)
DR G, can you look at this when you get a moment.

## 2019-01-04 NOTE — Telephone Encounter (Signed)
Sure that should not be a problem.

## 2019-01-04 NOTE — Telephone Encounter (Signed)
Patient aware ok to take Glucosamine

## 2019-01-07 NOTE — Telephone Encounter (Signed)
Done

## 2019-02-08 ENCOUNTER — Other Ambulatory Visit: Payer: Self-pay | Admitting: *Deleted

## 2019-02-08 DIAGNOSIS — C44629 Squamous cell carcinoma of skin of left upper limb, including shoulder: Secondary | ICD-10-CM | POA: Diagnosis not present

## 2019-02-08 DIAGNOSIS — L308 Other specified dermatitis: Secondary | ICD-10-CM | POA: Diagnosis not present

## 2019-02-08 DIAGNOSIS — L578 Other skin changes due to chronic exposure to nonionizing radiation: Secondary | ICD-10-CM | POA: Diagnosis not present

## 2019-02-08 MED ORDER — LISINOPRIL-HYDROCHLOROTHIAZIDE 20-12.5 MG PO TABS: 1.0000 | ORAL_TABLET | Freq: Every day | ORAL | 0 refills | Status: DC

## 2019-02-08 MED ORDER — SIMVASTATIN 20 MG PO TABS: 20.0000 mg | ORAL_TABLET | Freq: Every day | ORAL | 0 refills | Status: DC

## 2019-02-08 NOTE — Telephone Encounter (Signed)
Next OV 03/18/19

## 2019-03-14 ENCOUNTER — Other Ambulatory Visit: Payer: Self-pay | Admitting: Family Medicine

## 2019-03-14 ENCOUNTER — Telehealth: Payer: Self-pay | Admitting: Family Medicine

## 2019-03-14 DIAGNOSIS — R7301 Impaired fasting glucose: Secondary | ICD-10-CM

## 2019-03-14 DIAGNOSIS — E890 Postprocedural hypothyroidism: Secondary | ICD-10-CM

## 2019-03-14 NOTE — Telephone Encounter (Signed)
PT has an apt 3/20 with Dr Darnell Level and pt is wanting to come in on 3/17 to have labs done, so they will be back in time for her apt. Please call pt once orders are placed.

## 2019-03-14 NOTE — Telephone Encounter (Signed)
Patient aware.

## 2019-03-14 NOTE — Telephone Encounter (Signed)
Labs placed.

## 2019-03-15 ENCOUNTER — Other Ambulatory Visit: Payer: Medicare Other

## 2019-03-15 ENCOUNTER — Other Ambulatory Visit: Payer: Self-pay

## 2019-03-15 DIAGNOSIS — E890 Postprocedural hypothyroidism: Secondary | ICD-10-CM | POA: Diagnosis not present

## 2019-03-15 DIAGNOSIS — L905 Scar conditions and fibrosis of skin: Secondary | ICD-10-CM | POA: Diagnosis not present

## 2019-03-15 DIAGNOSIS — R7301 Impaired fasting glucose: Secondary | ICD-10-CM | POA: Diagnosis not present

## 2019-03-15 LAB — BAYER DCA HB A1C WAIVED: HB A1C (BAYER DCA - WAIVED): 5.9 % (ref <7.0)

## 2019-03-16 ENCOUNTER — Other Ambulatory Visit: Payer: Self-pay | Admitting: Family Medicine

## 2019-03-16 LAB — THYROID PANEL WITH TSH
Free Thyroxine Index: 1.8 (ref 1.2–4.9)
T3 Uptake Ratio: 28 % (ref 24–39)
T4, Total: 6.3 ug/dL (ref 4.5–12.0)
TSH: 1.4 u[IU]/mL (ref 0.450–4.500)

## 2019-03-16 MED ORDER — LISINOPRIL-HYDROCHLOROTHIAZIDE 20-12.5 MG PO TABS: 1.0000 | ORAL_TABLET | Freq: Every day | ORAL | 3 refills | Status: DC

## 2019-03-16 MED ORDER — LEVOTHYROXINE SODIUM 50 MCG PO TABS: 50.0000 ug | ORAL_TABLET | Freq: Every day | ORAL | 1 refills | Status: DC

## 2019-03-16 MED ORDER — LISINOPRIL 10 MG PO TABS: 10.0000 mg | ORAL_TABLET | Freq: Every day | ORAL | 3 refills | Status: DC

## 2019-03-18 ENCOUNTER — Ambulatory Visit: Payer: Medicare Other | Admitting: Family Medicine

## 2019-03-18 ENCOUNTER — Ambulatory Visit: Payer: Medicare Other | Admitting: Endocrinology

## 2019-03-18 ENCOUNTER — Telehealth: Payer: Self-pay | Admitting: Endocrinology

## 2019-03-18 NOTE — Telephone Encounter (Signed)
Patient has called back to ask again if she needs to come into office Monday since she had lab work done at PCP office that should be in the system.   Patient would like a call to advise and per patient a detailed message can be left if she does not answer

## 2019-03-18 NOTE — Telephone Encounter (Signed)
Please review and advise.

## 2019-03-18 NOTE — Telephone Encounter (Signed)
Patient called re: Is patient's appointment scheduled for 03/21/19 necessary for her to come into office? Patient had lab work done at PCP office that should be in the system. Due to Coronavirus is it still necessary for patient to come in at this time?. Please call patient at ph# [phone] to advise.

## 2019-03-18 NOTE — Telephone Encounter (Signed)
Please advise if you would still like to see pt OR if she should reschedule. If rescheduling, when would you like to see her?

## 2019-03-18 NOTE — Telephone Encounter (Signed)
OK, Please come back for a follow-up appointment in 6 months

## 2019-03-21 ENCOUNTER — Other Ambulatory Visit: Payer: Self-pay

## 2019-03-21 ENCOUNTER — Encounter: Payer: Self-pay | Admitting: Endocrinology

## 2019-03-21 ENCOUNTER — Ambulatory Visit: Payer: Medicare Other | Admitting: Endocrinology

## 2019-03-21 VITALS — BP 140/82 | HR 78 | Temp 98.3°F | Ht 66.0 in | Wt 170.0 lb

## 2019-03-21 DIAGNOSIS — E042 Nontoxic multinodular goiter: Secondary | ICD-10-CM | POA: Diagnosis not present

## 2019-03-21 NOTE — Telephone Encounter (Signed)
Would you please schedule this patient for a 6 month follow up from now?

## 2019-03-21 NOTE — Patient Instructions (Addendum)
Your blood pressure is high today.  Please see your primary care provider soon, to have it rechecked Please continue the same medication.  Let's recheck the ultrasound.  you will receive a phone call, about a day and time for an appointment. Please come back for a follow-up appointment in 1 year.  

## 2019-03-21 NOTE — Progress Notes (Signed)
Subjective:    Patient ID: Christine Cantrell, female    DOB: Oct 12, 1946, 73 y.o.   MRN: [id number]  HPI Pt returns for f/u of goiter (she had a partial thyroidectomy in the 1980's; she has been on synthroid since then; US showed nodules in remaining tissue; bx of the nodules in 2004 was benign; most recent US in 2018 was unchanged).  She does not notice the goiter.  Past Medical History:  Diagnosis Date  . Abnormal EKG saw dr hochrein 3 yrs ago for   hx of left bundle branch block on ekg since 2002  . Depression   . Diverticulosis large intestine w/o perforation or abscess w/bleeding 2016  . GERD (gastroesophageal reflux disease)    hx of  . High cholesterol   . History of hiatal hernia   . Hx of colonic polyps 2016   adenomatous  . Hypertension   . Thyroid disease    hypothyroidism    Past Surgical History:  Procedure Laterality Date  . ABDOMINAL HYSTERECTOMY  2002   complete for plyps  . ESOPHAGOGASTRODUODENOSCOPY N/A 09/30/2018   Procedure: ESOPHAGOGASTRODUODENOSCOPY (EGD);  Surgeon: Milus Banister, MD;  Location: [facility];  Service: Endoscopy;  Laterality: N/A;  . EUS N/A 09/30/2018   Procedure: UPPER ENDOSCOPIC ULTRASOUND (EUS) RADIAL;  Surgeon: Milus Banister, MD;  Location: [facility];  Service: Endoscopy;  Laterality: N/A;  . LIVER BIOPSY     normal result  . thryoid needle biopsy  1990's  . THYROIDECTOMY  1982    Social History   Socioeconomic History  . Marital status: Single    Spouse name: Not on file  . Number of children: 0  . Years of education: Not on file  . Highest education level: Not on file  Occupational History  . Occupation: retired    Comment: Therapist, nutritional  Social Needs  . Financial resource strain: Not on file  . Food insecurity:    Worry: Not on file    Inability: Not on file  . Transportation needs:    Medical: Not on file    Non-medical: Not on file  Tobacco Use  . Smoking status: Never Smoker  . Smokeless tobacco:  Never Used  Substance and Sexual Activity  . Alcohol use: Yes    Comment: occasionally  . Drug use: Never  . Sexual activity: Yes    Partners: Male    Birth control/protection: Surgical  Lifestyle  . Physical activity:    Days per week: Not on file    Minutes per session: Not on file  . Stress: Not on file  Relationships  . Social connections:    Talks on phone: Not on file    Gets together: Not on file    Attends religious service: Not on file    Active member of club or organization: Not on file    Attends meetings of clubs or organizations: Not on file    Relationship status: Not on file  . Intimate partner violence:    Fear of current or ex partner: Not on file    Emotionally abused: Not on file    Physically abused: Not on file    Forced sexual activity: Not on file  Other Topics Concern  . Not on file  Social History Narrative   Lives alone.  No children    Current Outpatient Medications on File Prior to Visit  Medication Sig Dispense Refill  . Biotin 2500 MCG CAPS Take 2,500 mcg by mouth daily  after lunch.    . Coenzyme Q10-Vitamin E (QUNOL ULTRA COQ10 PO) Take 1 capsule by mouth daily after lunch.    Javier Docker Oil 500 MG CAPS Take 500 mg by mouth daily after lunch.    . levothyroxine (SYNTHROID, LEVOTHROID) 50 MCG tablet Take 1 tablet (50 mcg total) by mouth daily. 90 tablet 1  . lisinopril (PRINIVIL,ZESTRIL) 10 MG tablet Take 1 tablet (10 mg total) by mouth daily. 90 tablet 3  . lisinopril-hydrochlorothiazide (PRINZIDE,ZESTORETIC) 20-12.5 MG tablet Take 1 tablet by mouth daily. 90 tablet 3  . Magnesium 250 MG TABS Take 250 mg by mouth daily after lunch.    . Multiple Vitamin (MULTIVITAMIN WITH MINERALS) TABS tablet Take 1 tablet by mouth daily after lunch. Women's One A Day Multivitamin    . Polyethyl Glycol-Propyl Glycol (LUBRICANT EYE DROPS) 0.4-0.3 % SOLN Place 1 drop into both eyes 3 (three) times daily as needed (dry eyes.).    Marland Kitchen simvastatin (ZOCOR) 20 MG tablet  Take 1 tablet (20 mg total) by mouth daily. 90 tablet 0   No current facility-administered medications on file prior to visit.     No Known Allergies  Family History  Problem Relation Age of Onset  . High Cholesterol Mother   . Hypertension Mother   . Goiter Mother   . High Cholesterol Father   . Hypertension Father   . Hypertension Brother   . Lung disease Brother        chronic smoker  . Heart disease Brother   . Goiter Sister   . Heart disease Brother   . Hypertension Brother   . Prostate cancer Brother   . Colon cancer Neg Hx   . Stomach cancer Neg Hx   . Pancreatic cancer Neg Hx     BP 140/82   Pulse 78   Temp 98.3 F (36.8 C)   Ht 5\' 6"  (1.676 m)   Wt 170 lb (77.1 kg)   SpO2 98%   BMI 27.44 kg/m    Review of Systems Denies nack pain    Objective:   Physical Exam VITAL SIGNS:  See vs page GENERAL: no distress Neck: a healed scar is present.  The 2 thyroid nodules are again palpated--1 on the left, and 1 on the right.     Lab Results  Component Value Date   TSH 1.400 03/15/2019   T3TOTAL 73 03/10/2016   T4TOTAL 6.3 03/15/2019      Assessment & Plan:  HTN: is noted today.   MNG: recheck is ordered.  Hypothyroidism: well-replaced.   Patient Instructions  Your blood pressure is high today.  Please see your primary care provider soon, to have it rechecked.   Please continue the same medication.  Let's recheck the ultrasound.  you will receive a phone call, about a day and time for an appointment.   Please come back for a follow-up appointment in 1 year.

## 2019-03-21 NOTE — Telephone Encounter (Signed)
Patient has been scheduled for 09/21/19 at 10:30 a.m.

## 2019-05-18 ENCOUNTER — Other Ambulatory Visit: Payer: Medicare Other

## 2019-05-29 ENCOUNTER — Other Ambulatory Visit: Payer: Self-pay | Admitting: Family Medicine

## 2019-06-01 ENCOUNTER — Ambulatory Visit
Admission: RE | Admit: 2019-06-01 | Discharge: 2019-06-01 | Disposition: A | Discharge status: Home / Self Care | Payer: Medicare Other | Source: Ambulatory Visit | Attending: Endocrinology | Admitting: Endocrinology

## 2019-06-01 DIAGNOSIS — E042 Nontoxic multinodular goiter: Secondary | ICD-10-CM

## 2019-06-02 DIAGNOSIS — Z85828 Personal history of other malignant neoplasm of skin: Secondary | ICD-10-CM | POA: Diagnosis not present

## 2019-06-02 DIAGNOSIS — L578 Other skin changes due to chronic exposure to nonionizing radiation: Secondary | ICD-10-CM | POA: Diagnosis not present

## 2019-06-10 DIAGNOSIS — Z1231 Encounter for screening mammogram for malignant neoplasm of breast: Secondary | ICD-10-CM | POA: Diagnosis not present

## 2019-06-23 ENCOUNTER — Other Ambulatory Visit: Payer: Self-pay | Admitting: Family Medicine

## 2019-06-24 ENCOUNTER — Other Ambulatory Visit: Payer: Self-pay

## 2019-06-27 ENCOUNTER — Ambulatory Visit (INDEPENDENT_AMBULATORY_CARE_PROVIDER_SITE_OTHER): Payer: Medicare Other | Admitting: Family Medicine

## 2019-06-27 ENCOUNTER — Other Ambulatory Visit: Payer: Self-pay

## 2019-06-27 ENCOUNTER — Encounter: Payer: Self-pay | Admitting: Family Medicine

## 2019-06-27 ENCOUNTER — Telehealth: Payer: Self-pay | Admitting: Family Medicine

## 2019-06-27 VITALS — BP 160/88 | HR 83 | Temp 99.4°F | Ht 66.0 in | Wt 172.6 lb

## 2019-06-27 DIAGNOSIS — R61 Generalized hyperhidrosis: Secondary | ICD-10-CM | POA: Diagnosis not present

## 2019-06-27 DIAGNOSIS — E782 Mixed hyperlipidemia: Secondary | ICD-10-CM | POA: Diagnosis not present

## 2019-06-27 DIAGNOSIS — I10 Essential (primary) hypertension: Secondary | ICD-10-CM

## 2019-06-27 DIAGNOSIS — R7301 Impaired fasting glucose: Secondary | ICD-10-CM

## 2019-06-27 DIAGNOSIS — E890 Postprocedural hypothyroidism: Secondary | ICD-10-CM

## 2019-06-27 LAB — BAYER DCA HB A1C WAIVED: HB A1C (BAYER DCA - WAIVED): 5.6 % (ref <7.0)

## 2019-06-27 MED ORDER — LEVOTHYROXINE SODIUM 50 MCG PO TABS: ORAL_TABLET | ORAL | 1 refills | Status: DC

## 2019-06-27 MED ORDER — SIMVASTATIN 20 MG PO TABS: 20.0000 mg | ORAL_TABLET | Freq: Every day | ORAL | 3 refills | Status: DC

## 2019-06-27 NOTE — Progress Notes (Signed)
Subjective: CC: f/u hypothyroidism, HTN, elevated BGs PCP: Janora Norlander, DO [id number] Seguin is a 73 y.o. female presenting to clinic today for:  1.  Hypothyroidism History: Partial thyroidectomy several years ago.  Followed by Dr. Renato Shin with [facility] for interval thyroid ultrasounds for thyroid nodule surveillance.  She notes that she had recent ultrasound with her endocrinologist.  They have follow-up to discuss results in September.  There is potential for need of repeat biopsy, though she notes that this was done in [place] many years ago.  She does report an episode of having sweated overnight x2.  These were not consecutive.  They did not seem to be related to the climate in her room being too hot.  She denies any abnormal vaginal bleeding, any breast masses, any abnormal rectal bleeding.  No unplanned weight loss.  She overall is feeling well otherwise.  She reports compliance with her Synthroid 50 mcg daily.  No diarrhea, constipation, heart palpitations, difficulty swallowing or change in voice.  No tremors.  2.  Hypertension with hyperlipidemia Patient reports compliance with lisinopril-hydrochlorothiazide every morning.  About 5 hours later she takes lisinopril 10 mg daily.  She also takes simvastatin nightly for cholesterol.  She tries to remain physically active.  She is not fasting today but would like cholesterol tested.  No chest pain, shortness of breath, lower extremity edema.   ROS: Per HPI  No Known Allergies Past Medical History:  Diagnosis Date  . Abnormal EKG saw dr hochrein 3 yrs ago for   hx of left bundle branch block on ekg since 2002  . Depression   . Diverticulosis large intestine w/o perforation or abscess w/bleeding 2016  . GERD (gastroesophageal reflux disease)    hx of  . High cholesterol   . History of hiatal hernia   . Hx of colonic polyps 2016   adenomatous  . Hypertension   . Thyroid disease    hypothyroidism     Current Outpatient Medications:  .  Biotin 2500 MCG CAPS, Take 2,500 mcg by mouth daily after lunch., Disp: , Rfl:  .  Coenzyme Q10-Vitamin E (QUNOL ULTRA COQ10 PO), Take 1 capsule by mouth daily after lunch., Disp: , Rfl:  .  Krill Oil 500 MG CAPS, Take 500 mg by mouth daily after lunch., Disp: , Rfl:  .  lisinopril (PRINIVIL,ZESTRIL) 10 MG tablet, Take 1 tablet (10 mg total) by mouth daily., Disp: 90 tablet, Rfl: 3 .  lisinopril-hydrochlorothiazide (PRINZIDE,ZESTORETIC) 20-12.5 MG tablet, Take 1 tablet by mouth daily., Disp: 90 tablet, Rfl: 3 .  Magnesium 250 MG TABS, Take 250 mg by mouth daily after lunch., Disp: , Rfl:  .  Multiple Vitamin (MULTIVITAMIN WITH MINERALS) TABS tablet, Take 1 tablet by mouth daily after lunch. Women's One A Day Multivitamin, Disp: , Rfl:  .  Polyethyl Glycol-Propyl Glycol (LUBRICANT EYE DROPS) 0.4-0.3 % SOLN, Place 1 drop into both eyes 3 (three) times daily as needed (dry eyes.)., Disp: , Rfl:  .  simvastatin (ZOCOR) 20 MG tablet, Take 1 tablet (20 mg total) by mouth daily., Disp: 90 tablet, Rfl: 0 .  SYNTHROID 50 MCG tablet, TAKE 1 TABLET (50 MCG TOTAL) BY MOUTH DAILY. NEEDS TO BE SEEN FOR FUTURE REFILLS, Disp: 30 tablet, Rfl: 0 Social History   Socioeconomic History  . Marital status: Single    Spouse name: Not on file  . Number of children: 0  . Years of education: Not on file  . Highest education level: Not  on file  Occupational History  . Occupation: retired    Comment: Therapist, nutritional  Social Needs  . Financial resource strain: Not on file  . Food insecurity    Worry: Not on file    Inability: Not on file  . Transportation needs    Medical: Not on file    Non-medical: Not on file  Tobacco Use  . Smoking status: Never Smoker  . Smokeless tobacco: Never Used  Substance and Sexual Activity  . Alcohol use: Yes    Comment: occasionally  . Drug use: Never  . Sexual activity: Yes    Partners: Male    Birth control/protection: Surgical   Lifestyle  . Physical activity    Days per week: Not on file    Minutes per session: Not on file  . Stress: Not on file  Relationships  . Social Herbalist on phone: Not on file    Gets together: Not on file    Attends religious service: Not on file    Active member of club or organization: Not on file    Attends meetings of clubs or organizations: Not on file    Relationship status: Not on file  . Intimate partner violence    Fear of current or ex partner: Not on file    Emotionally abused: Not on file    Physically abused: Not on file    Forced sexual activity: Not on file  Other Topics Concern  . Not on file  Social History Narrative   Lives alone.  No children   Family History  Problem Relation Age of Onset  . High Cholesterol Mother   . Hypertension Mother   . Goiter Mother   . High Cholesterol Father   . Hypertension Father   . Hypertension Brother   . Lung disease Brother        chronic smoker  . Heart disease Brother   . Goiter Sister   . Heart disease Brother   . Hypertension Brother   . Prostate cancer Brother   . Colon cancer Neg Hx   . Stomach cancer Neg Hx   . Pancreatic cancer Neg Hx     Objective: Office vital signs reviewed. BP (!) 160/88   Pulse 83   Temp 99.4 F (37.4 C) (Oral)   Ht _0  (1.676 m)   Wt 172 lb 9.6 oz (78.3 kg)   BMI 27.86 kg/m   Physical Examination:  General: Awake, alert, well nourished, No acute distress HEENT: Normal: Palpable small, hard left-sided thyroid nodule in the upper lobe.  No goiter.  No exophthalmos. Cardio: regular rate and rhythm, S1S2 heard, no murmurs appreciated Pulm: clear to auscultation bilaterally, no wheezes, rhonchi or rales; normal work of breathing on room air Extremities: warm, well perfused, No edema, cyanosis or clubbing; +2 pulses bilaterally MSK: Normal gait and station Skin: dry; intact; no rashes or lesions; normal temperature Neuro: No tremor Psych: Seems somewhat anxious  today  Assessment/ Plan: 73 y.o. female   1. Essential hypertension Not controlled.  Because she seems a bit anxious today I will allow her to continue with her current regimen and have her blood pressure rechecked in 1 month.  If persistently elevated, we will increase her lisinopril. - CMP14+EGFR  2. Mixed hyperlipidemia Nonfasting lipid panel - Lipid Panel - simvastatin (ZOCOR) 20 MG tablet; Take 1 tablet (20 mg total) by mouth daily.  Dispense: 90 tablet; Refill: 3  3. Elevated fasting blood sugar  Check A1c - Bayer DCA Hb A1c Waived  4. Postoperative hypothyroidism - Thyroid Panel With TSH - TSH; Standing - levothyroxine (SYNTHROID) 50 MCG tablet; TAKE 1 TABLET (50 MCG TOTAL) BY MOUTH DAILY.  Dispense: 90 tablet; Refill: 1  5. Night sweat Uncertain etiology.  Check CBC.  At this time she is demonstrating no other red flags or signs that would suggest malignancy. - CBC   Orders Placed This Encounter  Procedures  . Thyroid Panel With TSH  . Bayer DCA Hb A1c Waived  . CMP14+EGFR  . Lipid Panel   No orders of the defined types were placed in this encounter.    Janora Norlander, DO [place] [phone]

## 2019-06-27 NOTE — Patient Instructions (Signed)

## 2019-06-27 NOTE — Telephone Encounter (Signed)
Patient states rx was fixed.

## 2019-06-28 ENCOUNTER — Telehealth: Payer: Self-pay | Admitting: Family Medicine

## 2019-06-28 LAB — CMP14+EGFR
ALT: 26 IU/L (ref 0–32)
AST: 25 IU/L (ref 0–40)
Albumin/Globulin Ratio: 2 (ref 1.2–2.2)
Albumin: 4.7 g/dL (ref 3.7–4.7)
Alkaline Phosphatase: 30 IU/L — ABNORMAL LOW (ref 39–117)
BUN/Creatinine Ratio: 18 (ref 12–28)
BUN: 14 mg/dL (ref 8–27)
Bilirubin Total: 0.8 mg/dL (ref 0.0–1.2)
CO2: 22 mmol/L (ref 20–29)
Calcium: 9.3 mg/dL (ref 8.7–10.3)
Chloride: 95 mmol/L — ABNORMAL LOW (ref 96–106)
Creatinine, Ser: 0.8 mg/dL (ref 0.57–1.00)
GFR calc Af Amer: 83 mL/min/{1.73_m2} (ref >59)
GFR calc non Af Amer: 72 mL/min/{1.73_m2} (ref >59)
Globulin, Total: 2.3 g/dL (ref 1.5–4.5)
Glucose: 127 mg/dL — ABNORMAL HIGH (ref 65–99)
Potassium: 4.5 mmol/L (ref 3.5–5.2)
Sodium: 133 mmol/L — ABNORMAL LOW (ref 134–144)
Total Protein: 7 g/dL (ref 6.0–8.5)

## 2019-06-28 LAB — LIPID PANEL
Chol/HDL Ratio: 2.7 ratio (ref 0.0–4.4)
Cholesterol, Total: 213 mg/dL — ABNORMAL HIGH (ref 100–199)
HDL: 80 mg/dL (ref >39)
LDL Calculated: 99 mg/dL (ref 0–99)
Triglycerides: 171 mg/dL — ABNORMAL HIGH (ref 0–149)
VLDL Cholesterol Cal: 34 mg/dL (ref 5–40)

## 2019-06-28 LAB — CBC
Hematocrit: 42.5 % (ref 34.0–46.6)
Hemoglobin: 15.2 g/dL (ref 11.1–15.9)
MCH: 34.7 pg — ABNORMAL HIGH (ref 26.6–33.0)
MCHC: 35.8 g/dL — ABNORMAL HIGH (ref 31.5–35.7)
MCV: 97 fL (ref 79–97)
Platelets: 145 10*3/uL — ABNORMAL LOW (ref 150–450)
RBC: 4.38 x10E6/uL (ref 3.77–5.28)
RDW: 12.7 % (ref 11.7–15.4)
WBC: 5.3 10*3/uL (ref 3.4–10.8)

## 2019-06-28 LAB — THYROID PANEL WITH TSH
Free Thyroxine Index: 1.8 (ref 1.2–4.9)
T3 Uptake Ratio: 27 % (ref 24–39)
T4, Total: 6.7 ug/dL (ref 4.5–12.0)
TSH: 1.11 u[IU]/mL (ref 0.450–4.500)

## 2019-06-28 NOTE — Telephone Encounter (Signed)
Returned patient's phone call.  Patient would like to stay on current dose of Zocor until next appt

## 2019-07-07 DIAGNOSIS — Z1159 Encounter for screening for other viral diseases: Secondary | ICD-10-CM | POA: Diagnosis not present

## 2019-08-01 ENCOUNTER — Telehealth: Payer: Self-pay | Admitting: Family Medicine

## 2019-08-01 NOTE — Telephone Encounter (Signed)
Patient aware and refused appt

## 2019-08-01 NOTE — Telephone Encounter (Signed)
Please put on virtual visit schedule.

## 2019-08-01 NOTE — Telephone Encounter (Signed)
Fever blister for 3-4 days, has not tried anything OTC but is to give blood this week and would like to have it cleared up before then.  Patient is requesting a prescription to be sent to the pharmacy

## 2019-09-19 ENCOUNTER — Other Ambulatory Visit: Payer: Self-pay

## 2019-09-21 ENCOUNTER — Ambulatory Visit (INDEPENDENT_AMBULATORY_CARE_PROVIDER_SITE_OTHER): Payer: Medicare Other | Admitting: Endocrinology

## 2019-09-21 ENCOUNTER — Other Ambulatory Visit: Payer: Self-pay

## 2019-09-21 ENCOUNTER — Encounter: Payer: Self-pay | Admitting: Endocrinology

## 2019-09-21 VITALS — BP 134/72 | HR 76 | Ht 66.0 in | Wt 174.4 lb

## 2019-09-21 DIAGNOSIS — E042 Nontoxic multinodular goiter: Secondary | ICD-10-CM | POA: Diagnosis not present

## 2019-09-21 NOTE — Patient Instructions (Addendum)
Please continue the same medication.  Please come back for a follow-up appointment in 8 months.

## 2019-09-21 NOTE — Progress Notes (Signed)
Subjective:    Patient ID: Christine Cantrell, female    DOB: 1946/01/12, 73 y.o.   MRN: [id number]  HPI Pt returns for f/u of goiter (she had a partial thyroidectomy in the 1980's; she has been on synthroid since then; US showed nodules in remaining tissue; bx of the nodules in 2004 was benign; most recent US in 2020 advised bx of right nodule, but radiologist was unaware of prior bx).  She does not notice the goiter.   Past Medical History:  Diagnosis Date  . Abnormal EKG saw dr hochrein 3 yrs ago for   hx of left bundle branch block on ekg since 2002  . Depression   . Diverticulosis large intestine w/o perforation or abscess w/bleeding 2016  . GERD (gastroesophageal reflux disease)    hx of  . High cholesterol   . History of hiatal hernia   . Hx of colonic polyps 2016   adenomatous  . Hypertension   . Thyroid disease    hypothyroidism    Past Surgical History:  Procedure Laterality Date  . ABDOMINAL HYSTERECTOMY  2002   complete for plyps  . ESOPHAGOGASTRODUODENOSCOPY N/A 09/30/2018   Procedure: ESOPHAGOGASTRODUODENOSCOPY (EGD);  Surgeon: Milus Banister, MD;  Location: [facility];  Service: Endoscopy;  Laterality: N/A;  . EUS N/A 09/30/2018   Procedure: UPPER ENDOSCOPIC ULTRASOUND (EUS) RADIAL;  Surgeon: Milus Banister, MD;  Location: [facility];  Service: Endoscopy;  Laterality: N/A;  . LIVER BIOPSY     normal result  . thryoid needle biopsy  1990's  . THYROIDECTOMY  1982    Social History   Socioeconomic History  . Marital status: Single    Spouse name: Not on file  . Number of children: 0  . Years of education: Not on file  . Highest education level: Not on file  Occupational History  . Occupation: retired    Comment: Therapist, nutritional  Social Needs  . Financial resource strain: Not on file  . Food insecurity    Worry: Not on file    Inability: Not on file  . Transportation needs    Medical: Not on file    Non-medical: Not on file  Tobacco Use  .  Smoking status: Never Smoker  . Smokeless tobacco: Never Used  Substance and Sexual Activity  . Alcohol use: Yes    Comment: occasionally  . Drug use: Never  . Sexual activity: Yes    Partners: Male    Birth control/protection: Surgical  Lifestyle  . Physical activity    Days per week: Not on file    Minutes per session: Not on file  . Stress: Not on file  Relationships  . Social Herbalist on phone: Not on file    Gets together: Not on file    Attends religious service: Not on file    Active member of club or organization: Not on file    Attends meetings of clubs or organizations: Not on file    Relationship status: Not on file  . Intimate partner violence    Fear of current or ex partner: Not on file    Emotionally abused: Not on file    Physically abused: Not on file    Forced sexual activity: Not on file  Other Topics Concern  . Not on file  Social History Narrative   Lives alone.  No children    Current Outpatient Medications on File Prior to Visit  Medication Sig Dispense Refill  .  Biotin 2500 MCG CAPS Take 2,500 mcg by mouth daily after lunch.    . Coenzyme Q10-Vitamin E (QUNOL ULTRA COQ10 PO) Take 1 capsule by mouth daily after lunch.    Javier Docker Oil 500 MG CAPS Take 500 mg by mouth daily after lunch.    . levothyroxine (SYNTHROID) 50 MCG tablet TAKE 1 TABLET (50 MCG TOTAL) BY MOUTH DAILY. 90 tablet 1  . lisinopril (PRINIVIL,ZESTRIL) 10 MG tablet Take 1 tablet (10 mg total) by mouth daily. 90 tablet 3  . lisinopril-hydrochlorothiazide (PRINZIDE,ZESTORETIC) 20-12.5 MG tablet Take 1 tablet by mouth daily. 90 tablet 3  . Magnesium 250 MG TABS Take 250 mg by mouth daily after lunch.    . Multiple Vitamin (MULTIVITAMIN WITH MINERALS) TABS tablet Take 1 tablet by mouth daily after lunch. Women's One A Day Multivitamin    . Polyethyl Glycol-Propyl Glycol (LUBRICANT EYE DROPS) 0.4-0.3 % SOLN Place 1 drop into both eyes 3 (three) times daily as needed (dry eyes.).     Marland Kitchen simvastatin (ZOCOR) 20 MG tablet Take 1 tablet (20 mg total) by mouth daily. 90 tablet 3   No current facility-administered medications on file prior to visit.     No Known Allergies  Family History  Problem Relation Age of Onset  . High Cholesterol Mother   . Hypertension Mother   . Goiter Mother   . High Cholesterol Father   . Hypertension Father   . Hypertension Brother   . Lung disease Brother        chronic smoker  . Heart disease Brother   . Goiter Sister   . Heart disease Brother   . Hypertension Brother   . Prostate cancer Brother   . Colon cancer Neg Hx   . Stomach cancer Neg Hx   . Pancreatic cancer Neg Hx     BP 134/72 (BP Location: Left Arm, Patient Position: Sitting, Cuff Size: Large)   Pulse 76   Ht 5\' 6"  (1.676 m)   Wt 174 lb 6.4 oz (79.1 kg)   SpO2 98%   BMI 28.15 kg/m    Review of Systems Denies neck pain.      Objective:   Physical Exam VITAL SIGNS:  See vs page GENERAL: no distress Neck: a healed scar is present.  The 2 thyroid nodules are again palpated--1 on each side  Lab Results  Component Value Date   TSH 1.110 06/27/2019   T3TOTAL 73 03/10/2016   T4TOTAL 6.7 06/27/2019      Assessment & Plan:  MNG: stable Hypothyroidism: well-replaced.  Patient Instructions  Please continue the same medication.  Please come back for a follow-up appointment in 8 months.

## 2019-09-26 ENCOUNTER — Other Ambulatory Visit: Payer: Self-pay

## 2019-09-27 ENCOUNTER — Encounter: Payer: Self-pay | Admitting: Family Medicine

## 2019-09-27 ENCOUNTER — Ambulatory Visit (INDEPENDENT_AMBULATORY_CARE_PROVIDER_SITE_OTHER): Payer: Medicare Other | Admitting: Family Medicine

## 2019-09-27 VITALS — BP 159/81 | HR 98 | Temp 97.5°F | Ht 66.0 in | Wt 172.0 lb

## 2019-09-27 DIAGNOSIS — K862 Cyst of pancreas: Secondary | ICD-10-CM

## 2019-09-27 DIAGNOSIS — E782 Mixed hyperlipidemia: Secondary | ICD-10-CM

## 2019-09-27 DIAGNOSIS — I10 Essential (primary) hypertension: Secondary | ICD-10-CM | POA: Diagnosis not present

## 2019-09-27 NOTE — Patient Instructions (Addendum)
Please keep a blood pressure log at home.  I will have the nurse check you in about 2 weeks.  Somewhat confounded as to why your blood pressure is elevated during today's visit.  I am not going to make any changes given excellent control at other appointments.  We discussed performing fasting lipid panel at our next visit in 3 months to follow-up on your cholesterol.  We discussed that your ten-year risk of stroke and heart attack is over 30% and that we may need to consider switching your cholesterol medicine if this does not improve with diet modification.  Additionally, I could not find in the notes where repeat CT was recommended.  I am going to reach out to Dr. Havery Moros to see if he would like me to go ahead and order this study or if he has other plans as to monitoring the lesion on your pancreas.

## 2019-09-27 NOTE — Progress Notes (Signed)
Subjective: CC: f/u HTN PCP: Janora Norlander, DO [id number] Christine Cantrell is a 73 y.o. female presenting to clinic today for:  1.  Hypertension with hyperlipidemia Patient reports compliance with lisinopril-hydrochlorothiazide 20/12.5 every morning.  About 5-6 hours later she takes lisinopril 10 mg daily.  She also takes simvastatin 20mg  qhs for cholesterol. She admits to not eating healthy since COVID19. She gives blood every few weeks and notes that blood pressures are perhaps in the 130s over [id number] to [id number] but certainly never as high as they are when she comes to our office.  In fact, she was seen by her endocrinologist last week and her blood pressure was in normal range.  She did take her blood pressure medicine this morning but has not taken her afternoon dose yet.  Denies chest pain, shortness of breath, lower extremity edema.  2.  Pancreatic cyst Patient had evaluation and biopsy of pancreatic lesion in 2019 with Dr. Havery Moros.  She was under the impression this was needing a repeat imaging to follow-up on any potential growth or change.  Though she is not sure and wanted to see if I could come through her records to find the recommendation.  She denies any abdominal pain, nausea, vomiting, early satiety, hematochezia or melena.  She has not palpated any abdominal masses.  ROS: Per HPI  No Known Allergies Past Medical History:  Diagnosis Date  . Abnormal EKG saw dr hochrein 3 yrs ago for   hx of left bundle branch block on ekg since 2002  . Depression   . Diverticulosis large intestine w/o perforation or abscess w/bleeding 2016  . GERD (gastroesophageal reflux disease)    hx of  . High cholesterol   . History of hiatal hernia   . Hx of colonic polyps 2016   adenomatous  . Hypertension   . Thyroid disease    hypothyroidism    Current Outpatient Medications:  .  Coenzyme Q10-Vitamin E (QUNOL ULTRA COQ10 PO), Take 1 capsule by mouth daily after lunch., Disp: , Rfl:  .  Krill Oil  500 MG CAPS, Take 500 mg by mouth daily after lunch., Disp: , Rfl:  .  levothyroxine (SYNTHROID) 50 MCG tablet, TAKE 1 TABLET (50 MCG TOTAL) BY MOUTH DAILY., Disp: 90 tablet, Rfl: 1 .  lisinopril (PRINIVIL,ZESTRIL) 10 MG tablet, Take 1 tablet (10 mg total) by mouth daily., Disp: 90 tablet, Rfl: 3 .  lisinopril-hydrochlorothiazide (PRINZIDE,ZESTORETIC) 20-12.5 MG tablet, Take 1 tablet by mouth daily., Disp: 90 tablet, Rfl: 3 .  Magnesium 250 MG TABS, Take 250 mg by mouth daily after lunch., Disp: , Rfl:  .  Multiple Vitamin (MULTIVITAMIN WITH MINERALS) TABS tablet, Take 1 tablet by mouth daily after lunch. Women's One A Day Multivitamin, Disp: , Rfl:  .  Polyethyl Glycol-Propyl Glycol (LUBRICANT EYE DROPS) 0.4-0.3 % SOLN, Place 1 drop into both eyes 3 (three) times daily as needed (dry eyes.)., Disp: , Rfl:  .  simvastatin (ZOCOR) 20 MG tablet, Take 1 tablet (20 mg total) by mouth daily., Disp: 90 tablet, Rfl: 3 Social History   Socioeconomic History  . Marital status: Single    Spouse name: Not on file  . Number of children: 0  . Years of education: Not on file  . Highest education level: Not on file  Occupational History  . Occupation: retired    Comment: Therapist, nutritional  Social Needs  . Financial resource strain: Not on file  . Food insecurity    Worry: Not on file  Inability: Not on file  . Transportation needs    Medical: Not on file    Non-medical: Not on file  Tobacco Use  . Smoking status: Never Smoker  . Smokeless tobacco: Never Used  Substance and Sexual Activity  . Alcohol use: Yes    Comment: occasionally  . Drug use: Never  . Sexual activity: Yes    Partners: Male    Birth control/protection: Surgical  Lifestyle  . Physical activity    Days per week: Not on file    Minutes per session: Not on file  . Stress: Not on file  Relationships  . Social Herbalist on phone: Not on file    Gets together: Not on file    Attends religious service: Not on  file    Active member of club or organization: Not on file    Attends meetings of clubs or organizations: Not on file    Relationship status: Not on file  . Intimate partner violence    Fear of current or ex partner: Not on file    Emotionally abused: Not on file    Physically abused: Not on file    Forced sexual activity: Not on file  Other Topics Concern  . Not on file  Social History Narrative   Lives alone.  No children   Family History  Problem Relation Age of Onset  . High Cholesterol Mother   . Hypertension Mother   . Goiter Mother   . High Cholesterol Father   . Hypertension Father   . Hypertension Brother   . Lung disease Brother        chronic smoker  . Heart disease Brother   . Goiter Sister   . Heart disease Brother   . Hypertension Brother   . Prostate cancer Brother   . Colon cancer Neg Hx   . Stomach cancer Neg Hx   . Pancreatic cancer Neg Hx     Objective: Office vital signs reviewed. BP (!) 159/81   Pulse 98   Temp (!) 97.5 F (36.4 C) (Oral)   Ht 5\' 6"  (1.676 m)   Wt 172 lb (78 kg)   SpO2 98%   BMI 27.76 kg/m   Physical Examination:  General: Awake, alert, well nourished, No acute distress HEENT: Normal; left ear with a mild cerumen.  Right ear with normal-appearing tympanic membrane. Cardio: regular rate and rhythm, S1S2 heard, no murmurs appreciated Pulm: clear to auscultation bilaterally, no wheezes, rhonchi or rales; normal work of breathing on room air Extremities: warm, well perfused, No edema, cyanosis or clubbing; +2 pulses bilaterally GI: Soft, nontender, nondistended.  No palpable abdominal masses.  Assessment/ Plan: 73 y.o. female   1. Essential hypertension Not controlled during today's visit but I reviewed her last several visits with other specialist and they are always controlled.  Unsure as to why her blood pressure becomes elevated here.  For now, I will not make any changes to her medication regimen.  We discussed  consideration of taking her lisinopril doses together rather than separating them as she has not had problems with dizziness or lightheadedness in the past.  I have also recommended that she follow-up in about 2 weeks to see the nurse for a blood pressure check after making these changes.  2. Mixed hyperlipidemia We discussed that her ASCVD risk score is greater than 30% and that we should consider alternative therapies like Lipitor to replace her Zocor.  She would like to work  on lifestyle modification for the next 3 months before making this change.  3. Pancreatic cyst We will reach out to her gastroenterologist to see if perhaps this needs ongoing surveillance with CT or other imaging modalities.     No orders of the defined types were placed in this encounter.  No orders of the defined types were placed in this encounter.    Janora Norlander, DO [place] [phone]

## 2019-09-28 ENCOUNTER — Telehealth: Payer: Self-pay

## 2019-09-28 ENCOUNTER — Telehealth: Payer: Self-pay | Admitting: Gastroenterology

## 2019-09-28 ENCOUNTER — Other Ambulatory Visit: Payer: Self-pay

## 2019-09-28 DIAGNOSIS — K862 Cyst of pancreas: Secondary | ICD-10-CM

## 2019-09-28 NOTE — Telephone Encounter (Signed)
Left message for patient to call back, trying to schedule MRCP

## 2019-09-28 NOTE — Telephone Encounter (Signed)
Patient scheduled for MRCP at [facility] on 10/06/19 at 3:00pm. Patient to arrive at 2:30pm. Patient to be NPO 4 hrs. Before. Patient agrees to the above.

## 2019-09-29 DIAGNOSIS — H5213 Myopia, bilateral: Secondary | ICD-10-CM | POA: Diagnosis not present

## 2019-09-29 DIAGNOSIS — H353131 Nonexudative age-related macular degeneration, bilateral, early dry stage: Secondary | ICD-10-CM | POA: Diagnosis not present

## 2019-10-04 ENCOUNTER — Ambulatory Visit: Payer: Medicare Other

## 2019-10-04 ENCOUNTER — Other Ambulatory Visit: Payer: Self-pay

## 2019-10-04 VITALS — BP 128/69 | HR 82

## 2019-10-04 DIAGNOSIS — I10 Essential (primary) hypertension: Secondary | ICD-10-CM

## 2019-10-04 NOTE — Progress Notes (Signed)
Patient here today to have blood pressure checked.

## 2019-10-05 ENCOUNTER — Ambulatory Visit (INDEPENDENT_AMBULATORY_CARE_PROVIDER_SITE_OTHER): Payer: Medicare Other | Admitting: *Deleted

## 2019-10-05 DIAGNOSIS — Z0000 Encounter for general adult medical examination without abnormal findings: Secondary | ICD-10-CM | POA: Diagnosis not present

## 2019-10-05 NOTE — Progress Notes (Signed)
MEDICARE ANNUAL WELLNESS VISIT  10/05/2019  Telephone Visit Disclaimer This Medicare AWV was conducted by telephone due to national recommendations for restrictions regarding the COVID-19 Pandemic (e.g. social distancing).  I verified, using two identifiers, that I am speaking with Christine Cantrell or their authorized healthcare agent. I discussed the limitations, risks, security, and privacy concerns of performing an evaluation and management service by telephone and the potential availability of an in-person appointment in the future. The patient expressed understanding and agreed to proceed.   Subjective:  Christine Cantrell is a 73 y.o. female patient of Janora Norlander, DO who had a Medicare Annual Wellness Visit today via telephone. Jolita is Retired and lives alone. she has 0 children. she reports that she is socially active and does interact with friends/family regularly. she is moderately physically active and enjoys reading and staying active by walking at least 2 miles a day 5-7 days a week.  Patient Care Team: Janora Norlander, DO as PCP - General (Family Medicine)  Advanced Directives 10/05/2019 09/30/2018 09/28/2018 07/15/2018 06/24/2018 07/06/2015  Does Patient Have a Medical Advance Directive? No No No No No No  Would patient like information on creating a medical advance directive? No - Patient declined No - Patient declined No - Patient declined No - Patient declined No - Patient declined No - patient declined information    Hospital Utilization Over the Past 12 Months: # of hospitalizations or ER visits: 0 # of surgeries: 0  Review of Systems    Patient reports that her overall health is unchanged compared to last year.  History obtained from chart review  Patient Reported Readings (BP, Pulse, CBG, Weight, etc) none  Pain Assessment Pain : No/denies pain     Current Medications & Allergies (verified) Allergies as of 10/05/2019   No Known Allergies       Medication List       Accurate as of October 05, 2019  9:47 AM. If you have any questions, ask your nurse or doctor.        Krill Oil 500 MG Caps Take 500 mg by mouth daily after lunch.   levothyroxine 50 MCG tablet Commonly known as: Synthroid TAKE 1 TABLET (50 MCG TOTAL) BY MOUTH DAILY.   lisinopril 10 MG tablet Commonly known as: ZESTRIL Take 1 tablet (10 mg total) by mouth daily.   lisinopril-hydrochlorothiazide 20-12.5 MG tablet Commonly known as: ZESTORETIC Take 1 tablet by mouth daily.   Lubricant Eye Drops 0.4-0.3 % Soln Generic drug: Polyethyl Glycol-Propyl Glycol Place 1 drop into both eyes 3 (three) times daily as needed (dry eyes.).   Magnesium 250 MG Tabs Take 250 mg by mouth daily after lunch.   multivitamin with minerals Tabs tablet Take 1 tablet by mouth daily after lunch. Women's One A Day Multivitamin   QUNOL ULTRA COQ10 PO Take 1 capsule by mouth daily after lunch.   simvastatin 20 MG tablet Commonly known as: ZOCOR Take 1 tablet (20 mg total) by mouth daily.       History (reviewed): Past Medical History:  Diagnosis Date   Abnormal EKG saw dr hochrein 3 yrs ago for   hx of left bundle branch block on ekg since 2002   Depression    Diverticulosis large intestine w/o perforation or abscess w/bleeding 2016   GERD (gastroesophageal reflux disease)    hx of   High cholesterol    History of hiatal hernia    Hx of colonic polyps 2016  adenomatous   Hypertension    Thyroid disease    hypothyroidism   Past Surgical History:  Procedure Laterality Date   ABDOMINAL HYSTERECTOMY  2002   complete for plyps   ESOPHAGOGASTRODUODENOSCOPY N/A 09/30/2018   Procedure: ESOPHAGOGASTRODUODENOSCOPY (EGD);  Surgeon: Milus Banister, MD;  Location: [facility];  Service: Endoscopy;  Laterality: N/A;   EUS N/A 09/30/2018   Procedure: UPPER ENDOSCOPIC ULTRASOUND (EUS) RADIAL;  Surgeon: Milus Banister, MD;  Location: [facility];  Service:  Endoscopy;  Laterality: N/A;   LIVER BIOPSY     normal result   thryoid needle biopsy  1990's   THYROIDECTOMY  1982   Family History  Problem Relation Age of Onset   High Cholesterol Mother    Hypertension Mother    Goiter Mother    High Cholesterol Father    Hypertension Father    Hypertension Brother    Lung disease Brother        chronic smoker   Heart disease Brother    Goiter Sister    Heart disease Brother    Hypertension Brother    Prostate cancer Brother    Colon cancer Neg Hx    Stomach cancer Neg Hx    Pancreatic cancer Neg Hx    Social History   Socioeconomic History   Marital status: Single    Spouse name: Not on file   Number of children: 0   Years of education: 16   Highest education level: Bachelor's degree (e.g., BA, AB, BS)  Occupational History   Occupation: retired    Comment: Systems developer strain: Not hard at all   Food insecurity    Worry: Never true    Inability: Never true   Transportation needs    Medical: No    Non-medical: No  Tobacco Use   Smoking status: Never Smoker   Smokeless tobacco: Never Used  Substance and Sexual Activity   Alcohol use: Yes    Alcohol/week: 1.0 standard drinks    Types: 1 Glasses of wine per week    Comment: occasionally   Drug use: Never   Sexual activity: Not Currently    Partners: Male    Birth control/protection: Surgical  Lifestyle   Physical activity    Days per week: 5 days    Minutes per session: 40 min   Stress: Not at all  Relationships   Social connections    Talks on phone: More than three times a week    Gets together: More than three times a week    Attends religious service: More than 4 times per year    Active member of club or organization: Yes    Attends meetings of clubs or organizations: More than 4 times per year    Relationship status: Never married  Other Topics Concern   Not on file  Social  History Narrative   Lives alone.  No children    Activities of Daily Living In your present state of health, do you have any difficulty performing the following activities: 10/05/2019  Hearing? Y  Comment has a slight hearing deficit in her right ear  Vision? N  Comment wears glasses and gets yearly eye exams  Difficulty concentrating or making decisions? N  Walking or climbing stairs? N  Dressing or bathing? N  Doing errands, shopping? N  Preparing Food and eating ? N  Using the Toilet? N  In the past six months, have you accidently leaked  urine? N  Do you have problems with loss of bowel control? N  Managing your Medications? N  Managing your Finances? N  Housekeeping or managing your Housekeeping? N  Some recent data might be hidden    Patient Education/ Literacy How often do you need to have someone help you when you read instructions, pamphlets, or other written materials from your doctor or pharmacy?: 1 - Never What is the last grade level you completed in school?: Bachelors Degree-Economics  Exercise Current Exercise Habits: Home exercise routine, Type of exercise: walking, Time (Minutes): 40, Frequency (Times/Week): 5, Weekly Exercise (Minutes/Week): 200, Intensity: Moderate, Exercise limited by: None identified  Diet Patient reports consuming 3 meals a day and 0 snack(s) a day Patient reports that her primary diet is: Regular Patient reports that she does have regular access to food.   Depression Screen PHQ 2/9 Scores 10/05/2019 09/27/2019 06/27/2019 09/17/2018 07/15/2018 06/11/2018 05/28/2018  PHQ - 2 Score 0 0 0 0 0 0 0  PHQ- 9 Score - 0 - - - - -     Fall Risk Fall Risk  10/05/2019 09/27/2019 06/27/2019 09/17/2018 07/15/2018  Falls in the past year? 0 0 0 No No  Number falls in past yr: 0 - - - -  Injury with Fall? 0 - - - -  Follow up Falls prevention discussed - - - -  Comment Get rid of all throw rugs in the house, adequate lighting in the walkways and grab bars in  the bathroom - - - -     Objective:  Christine Cantrell seemed alert and oriented and she participated appropriately during our telephone visit.  Blood Pressure Weight BMI  BP Readings from Last 3 Encounters:  10/04/19 128/69  09/27/19 (!) 159/81  09/21/19 134/72   Wt Readings from Last 3 Encounters:  09/27/19 172 lb (78 kg)  09/21/19 174 lb 6.4 oz (79.1 kg)  06/27/19 172 lb 9.6 oz (78.3 kg)   BMI Readings from Last 1 Encounters:  09/27/19 27.76 kg/m    *Unable to obtain current vital signs, weight, and BMI due to telephone visit type  Hearing/Vision   Berenise did not seem to have difficulty with hearing/understanding during the telephone conversation  Reports that she has had a formal eye exam by an eye care professional within the past year  Reports that she has not had a formal hearing evaluation within the past year *Unable to fully assess hearing and vision during telephone visit type  Cognitive Function: 6CIT Screen 10/05/2019  What Year? 0 points  What month? 0 points  What time? 0 points  Count back from 20 0 points  Months in reverse 0 points  Repeat phrase 2 points  Total Score 2   (Normal:0-7, Significant for Dysfunction: >8)  Normal Cognitive Function Screening: Yes   Immunization & Health Maintenance Record Immunization History  Administered Date(s) Administered   Fluad Quad(high Dose 65+) 08/27/2019   Influenza, High Dose Seasonal PF 09/07/2018   Influenza-Unspecified 09/26/2015, 09/17/2016, 09/25/2017   Pneumococcal Polysaccharide-23 09/25/2017   Zoster Recombinat (Shingrix) 06/10/2017, 09/03/2017    Health Maintenance  Topic Date Due   TETANUS/TDAP  03/01/1965   COLONOSCOPY  03/27/2025   INFLUENZA VACCINE  Completed   DEXA SCAN  Completed   Hepatitis C Screening  Completed   PNA vac Low Risk Adult  Completed       Assessment  This is a routine wellness examination for Brendon Neyens.  Health Maintenance: Due or Overdue Health  Maintenance  Due  Topic Date Due   TETANUS/TDAP  03/01/1965    Jadien Depaul does not need a referral for Community Assistance: Care Management:   no Social Work:    no Prescription Assistance:  no Nutrition/Diabetes Education:  no   Plan:  Personalized Goals Goals Addressed            This Visit's Progress    DIET - INCREASE WATER INTAKE       Try to drink 6-8 glasses of water daily.      Personalized Health Maintenance & Screening Recommendations  Td vaccine  Lung Cancer Screening Recommended: no (Low Dose CT Chest recommended if Age 70-80 years, 30 pack-year currently smoking OR have quit w/in past 15 years) Hepatitis C Screening recommended: no HIV Screening recommended: no  Advanced Directives: Written information was not prepared per patient's request.  Referrals & Orders No orders of the defined types were placed in this encounter.   Follow-up Plan  Follow-up with Janora Norlander, DO as planned  Consider TDAP vaccine at your next visit  Once you have your Advanced Directives complete bring a copy in for our records   I have personally reviewed and noted the following in the patients chart:    Medical and social history  Use of alcohol, tobacco or illicit drugs   Current medications and supplements  Functional ability and status  Nutritional status  Physical activity  Advanced directives  List of other physicians  Hospitalizations, surgeries, and ER visits in previous 12 months  Vitals  Screenings to include cognitive, depression, and falls  Referrals and appointments  In addition, I have reviewed and discussed with Christine Cantrell certain preventive protocols, quality metrics, and best practice recommendations. A written personalized care plan for preventive services as well as general preventive health recommendations is available and can be mailed to the patient at her request.      Milas Hock, LPN  [id number]

## 2019-10-05 NOTE — Patient Instructions (Signed)
Preventive Care 75 Years and Older, Female Preventive care refers to lifestyle choices and visits with your health care provider that can promote health and wellness. This includes:  A yearly physical exam. This is also called an annual well check.  Regular dental and eye exams.  Immunizations.  Screening for certain conditions.  Healthy lifestyle choices, such as diet and exercise. What can I expect for my preventive care visit? Physical exam Your health care provider will check:  Height and weight. These may be used to calculate body mass index (BMI), which is a measurement that tells if you are at a healthy weight.  Heart rate and blood pressure.  Your skin for abnormal spots. Counseling Your health care provider may ask you questions about:  Alcohol, tobacco, and drug use.  Emotional well-being.  Home and relationship well-being.  Sexual activity.  Eating habits.  History of falls.  Memory and ability to understand (cognition).  Work and work Statistician.  Pregnancy and menstrual history. What immunizations do I need?  Influenza (flu) vaccine  This is recommended every year. Tetanus, diphtheria, and pertussis (Tdap) vaccine  You may need a Td booster every 10 years. Varicella (chickenpox) vaccine  You may need this vaccine if you have not already been vaccinated. Zoster (shingles) vaccine  You may need this after age 75. Pneumococcal conjugate (PCV13) vaccine  One dose is recommended after age 75. Pneumococcal polysaccharide (PPSV23) vaccine  One dose is recommended after age 75. Measles, mumps, and rubella (MMR) vaccine  You may need at least one dose of MMR if you were born in 1957 or later. You may also need a second dose. Meningococcal conjugate (MenACWY) vaccine  You may need this if you have certain conditions. Hepatitis A vaccine  You may need this if you have certain conditions or if you travel or work in places where you may be exposed  to hepatitis A. Hepatitis B vaccine  You may need this if you have certain conditions or if you travel or work in places where you may be exposed to hepatitis B. Haemophilus influenzae type b (Hib) vaccine  You may need this if you have certain conditions. You may receive vaccines as individual doses or as more than one vaccine together in one shot (combination vaccines). Talk with your health care provider about the risks and benefits of combination vaccines. What tests do I need? Blood tests  Lipid and cholesterol levels. These may be checked every 5 years, or more frequently depending on your overall health.  Hepatitis C test.  Hepatitis B test. Screening  Lung cancer screening. You may have this screening every year starting at age 75 if you have a 30-pack-year history of smoking and currently smoke or have quit within the past 15 years.  Colorectal cancer screening. All adults should have this screening starting at age 36 and continuing until age 75. Your health care provider may recommend screening at age 23 if you are at increased risk. You will have tests every 1-10 years, depending on your results and the type of screening test.  Diabetes screening. This is done by checking your blood sugar (glucose) after you have not eaten for a while (fasting). You may have this done every 1-3 years.  Mammogram. This may be done every 1-2 years. Talk with your health care provider about how often you should have regular mammograms.  BRCA-related cancer screening. This may be done if you have a family history of breast, ovarian, tubal, or peritoneal cancers.  Other tests  Sexually transmitted disease (STD) testing.  Bone density scan. This is done to screen for osteoporosis. You may have this done starting at age 76. Follow these instructions at home: Eating and drinking  Eat a diet that includes fresh fruits and vegetables, whole grains, lean protein, and low-fat dairy products. Limit  your intake of foods with high amounts of sugar, saturated fats, and salt.  Take vitamin and mineral supplements as recommended by your health care provider.  Do not drink alcohol if your health care provider tells you not to drink.  If you drink alcohol: ? Limit how much you have to 0-1 drink a day. ? Be aware of how much alcohol is in your drink. In the [place] one drink equals one 12 oz bottle of beer (355 mL), one 5 oz glass of wine (148 mL), or one 1 oz glass of hard liquor (44 mL). Lifestyle  Take daily care of your teeth and gums.  Stay active. Exercise for at least 30 minutes on 5 or more days each week.  Do not use any products that contain nicotine or tobacco, such as cigarettes, e-cigarettes, and chewing tobacco. If you need help quitting, ask your health care provider.  If you are sexually active, practice safe sex. Use a condom or other form of protection in order to prevent STIs (sexually transmitted infections).  Talk with your health care provider about taking a low-dose aspirin or statin. What's next?  Go to your health care provider once a year for a well check visit.  Ask your health care provider how often you should have your eyes and teeth checked.  Stay up to date on all vaccines. This information is not intended to replace advice given to you by your health care provider. Make sure you discuss any questions you have with your health care provider. Document Released: 01/11/2016 Document Revised: 12/09/2018 Document Reviewed: 12/09/2018 Elsevier Patient Education  2020 [employer].

## 2019-10-06 ENCOUNTER — Ambulatory Visit (HOSPITAL_COMMUNITY)
Admission: RE | Admit: 2019-10-06 | Discharge: 2019-10-06 | Disposition: A | Discharge status: Home / Self Care | Payer: Medicare Other | Source: Ambulatory Visit | Attending: Gastroenterology | Admitting: Gastroenterology

## 2019-10-06 ENCOUNTER — Other Ambulatory Visit: Payer: Self-pay | Admitting: Gastroenterology

## 2019-10-06 ENCOUNTER — Other Ambulatory Visit: Payer: Self-pay

## 2019-10-06 DIAGNOSIS — K862 Cyst of pancreas: Secondary | ICD-10-CM

## 2019-10-06 LAB — POCT I-STAT CREATININE: Creatinine, Ser: 0.8 mg/dL (ref 0.44–1.00)

## 2019-10-06 MED ORDER — GADOBUTROL 1 MMOL/ML IV SOLN
8.0000 mL | Freq: Once | INTRAVENOUS | Status: AC | PRN
  Administered 2019-10-06: 8 mL via INTRAVENOUS

## 2019-10-10 ENCOUNTER — Telehealth: Payer: Self-pay | Admitting: Gastroenterology

## 2019-10-10 ENCOUNTER — Telehealth: Payer: Self-pay | Admitting: Family Medicine

## 2019-10-10 NOTE — Telephone Encounter (Signed)
Called patient and let her know her CT results have not been released yet, as soon as they are reviewed and released Dr. Havery Moros will either send the results to her by My Chart, or he will send them to me and I will call her with them. She is good with that

## 2019-10-10 NOTE — Telephone Encounter (Signed)
Pt aware to contact Dr Havery Moros - as he is ordering physician

## 2019-10-11 ENCOUNTER — Telehealth: Payer: Self-pay | Admitting: Family Medicine

## 2019-10-11 NOTE — Telephone Encounter (Signed)
Looks like another Dr ordered the MRI. Spoke with patient and advised to contact their office for results. Patient verbalized understanding

## 2019-10-17 DIAGNOSIS — L820 Inflamed seborrheic keratosis: Secondary | ICD-10-CM | POA: Diagnosis not present

## 2019-10-17 DIAGNOSIS — L821 Other seborrheic keratosis: Secondary | ICD-10-CM | POA: Diagnosis not present

## 2019-10-17 DIAGNOSIS — D1801 Hemangioma of skin and subcutaneous tissue: Secondary | ICD-10-CM | POA: Diagnosis not present

## 2019-10-17 DIAGNOSIS — L570 Actinic keratosis: Secondary | ICD-10-CM | POA: Diagnosis not present

## 2019-10-17 DIAGNOSIS — L814 Other melanin hyperpigmentation: Secondary | ICD-10-CM | POA: Diagnosis not present

## 2019-10-17 DIAGNOSIS — D225 Melanocytic nevi of trunk: Secondary | ICD-10-CM | POA: Diagnosis not present

## 2019-10-17 DIAGNOSIS — L918 Other hypertrophic disorders of the skin: Secondary | ICD-10-CM | POA: Diagnosis not present

## 2019-11-15 ENCOUNTER — Other Ambulatory Visit: Payer: Self-pay

## 2019-11-15 ENCOUNTER — Encounter: Payer: Self-pay | Admitting: Gastroenterology

## 2019-11-15 ENCOUNTER — Ambulatory Visit: Payer: Medicare Other | Admitting: Gastroenterology

## 2019-11-15 VITALS — BP 160/74 | HR 88 | Temp 97.4°F | Ht 66.0 in | Wt 174.0 lb

## 2019-11-15 DIAGNOSIS — K862 Cyst of pancreas: Secondary | ICD-10-CM

## 2019-11-15 DIAGNOSIS — R932 Abnormal findings on diagnostic imaging of liver and biliary tract: Secondary | ICD-10-CM

## 2019-11-15 DIAGNOSIS — Z86010 Personal history of colonic polyps: Secondary | ICD-10-CM

## 2019-11-15 NOTE — Patient Instructions (Signed)
If you are age 75 or older, your body mass index should be between 23-30. Your Body mass index is 28.08 kg/m. If this is out of the aforementioned range listed, please consider follow up with your Primary Care Provider.  If you are age 42 or younger, your body mass index should be between 19-25. Your Body mass index is 28.08 kg/m. If this is out of the aformentioned range listed, please consider follow up with your Primary Care Provider.   To help prevent the possible spread of infection to our patients, communities, and staff; we will be implementing the following measures:  As of now we are not allowing any visitors/family members to accompany you to any upcoming appointments with [facility]. If you have any concerns about this please contact our office to discuss prior to the appointment.   You will be due for a recall colonoscopy in 02-2022. We will send you a reminder in the mail when it gets closer to that time.  You will be due for a recall MCRP in 1 year. We will send you a reminder in the mail when it gets closer to that time.  Thank you for entrusting me with your care and for choosing [facility], Dr. Rudy Cellar

## 2019-11-15 NOTE — Progress Notes (Signed)
HPI :  73 year old female here for follow-up visit.  She has been followed here for history of pancreatic cyst and abnormal liver imaging.  In the spring 2019 she developed mild pancreatitis.  At that time she had a CT scan which showed a pancreatic head cyst, this led to an ultrasound which showed the pancreatic lesion and a follow-up MRI  showed the pancreatic cyst, measuring 1.9 x 1.5 x 2.3 cm in size, and it also was remarkable for a 5.5 cm liver lesion. 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  Since of last seen her she had a chest x-ray which did not show any evidence of pulmonary sarcoidosis and a normal ACE level.  Her LFTs have remained normal.  She underwent a follow-up CT scan in September 2019 as below which showed stable size of the pancreatic cyst which persisted.  She had an EUS with Dr. Ardis Hughs to more formally evaluate this in October 2019.  The cyst was stable at 2.7 cm in size, due to numerous blood vessels around the cyst, there was not a safe window to obtain an FNA.  A follow-up MRI was recommended which she had done on October 8.  The cyst is stable in size without interval enlargement.  There are no other concerning changes.  Interestingly the previously noted liver lesion is no longer visualized, she does have mild hepatic steatosis.  She denies any abdominal pains.  No weight loss.  She is eating quite well, denies any digestive problems at all.  Moving her bowels okay.  She denies any first-degree family history of pancreatic cancer.  She has had 2 nephews with pancreatic cancer.  No family history of colon cancer.  She did have 1 small adenoma removed in 2016 with a good prep reported as below.   Prior workup: CT 05/17/18 - 18 mm cystic lesion projecting off the pancreatic head, Advanced atherosclerotic calcifications involving the aorta and iliac  arteries.  [place] 05/27/18  -Mildly complex cystic lesion arising from the uncinate process of the pancreas, similar to recent CT examination. Slightly prominent pancreatic duct   MRI abdomen 06/10/18  - 1.9 by 1.5 by 2.3 cm simple appearing cystic lesion along the posterior margin of pancreatic head, questionable continuity with the dorsal pancreatic duct. Pancreas divisum is present and no pancreatic duct dilatation is observed.   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 SEE NOTE. - NO FEATURES OF CHRONIC HEPATITIS OR A BILIARY DISORDER. - MINIMAL STEATOSIS WITHOUT EVIDENCE OF STEATOHEPATITIS. - NEGATIVE FOR MALIGNANCY.  Dr. Brunetta Genera [facility] EGD 03/28/2015 - small HH, no BE, mild gastritis / duodenitis - biopsies taken - preserved duodenal architecture with moderate numbers of lymphocytes - additional testing negative for celiac and lymphoproliferative disorder, H pylori negative Colonoscopy 03/28/2015 - 108mm descending colon polyp - adenoma, 75mm rectosigmoid polyp - hyperplastic, diverticulosis of left colon, random biopsies taken to rule out microscopic colitis, good prep -   CT scan 09/14/2018 - stable appearance of pancreatic cyst.   EUS done 09/30/2018 - Dr. Ardis Hughs - normal EGD, 2.7cm cyst in the head of the pancreas without concerning morphologic features. There were numerous signficiant sized blood vessels between the cyst and the lumen of the GI tract. I could not find a safe window to sample the cyst from either the duodenum or the stomach.  MRCP 10/06/19 - IMPRESSION: 1. Cystic lesion along the posterior margin of the uncinate process of the pancreas measures 4.2 cubic cm, previously 3.8 cubic cm. This does not qualify as interval growth. This is considered low risk by imaging. Possibilities include a postinflammatory lesion or intraductal papillary mucinous neoplasm.  We have now demonstrated 14 months of stability. Follow pancreatic protocol MRI is recommended in 6 months time. This recommendation follows ACR consensus guidelines: Management of Incidental Pancreatic Cysts: A White Paper of the ACR Incidental Findings Committee. J Am Coll Radiol [id number]. 2. The previous region of unusual enhancement in the dome of the liver is no longer seen. It is possible that this was artifactual due to some sort of boundary or ghost artifact on the prior MRI. This is a benign finding and requires no further workup. 3. Mild diffuse hepatic steatosis. 4.  Aortic Atherosclerosis (ICD10-I70.0). 5. Pancreas divisum.   Past Medical History:  Diagnosis Date  . Abnormal EKG saw dr hochrein 3 yrs ago for   hx of left bundle branch block on ekg since 2002  . Depression   . Diverticulosis large intestine w/o perforation or abscess w/bleeding 2016  . GERD (gastroesophageal reflux disease)    hx of  . High cholesterol   . History of hiatal hernia   . Hx of colonic polyps 2016   adenomatous  . Hypertension   . Pancreatic cyst   . Thyroid disease    hypothyroidism     Past Surgical History:  Procedure Laterality Date  . ABDOMINAL HYSTERECTOMY  2002   complete for plyps  . ESOPHAGOGASTRODUODENOSCOPY N/A 09/30/2018   Procedure: ESOPHAGOGASTRODUODENOSCOPY (EGD);  Surgeon: Milus Banister, MD;  Location: [facility];  Service: Endoscopy;  Laterality: N/A;  . EUS N/A 09/30/2018   Procedure: UPPER ENDOSCOPIC ULTRASOUND (EUS) RADIAL;  Surgeon: Milus Banister, MD;  Location: [facility];  Service: Endoscopy;  Laterality: N/A;  . LIVER BIOPSY     normal result  . thryoid needle biopsy  1990's  . THYROIDECTOMY  1982   Family History  Problem Relation Age of Onset  . High Cholesterol Mother   . Hypertension Mother   . Goiter Mother   . High Cholesterol Father   . Hypertension Father   . Hypertension Brother   . Lung disease Brother        chronic  smoker  . Heart disease Brother   . Goiter Sister   . Heart disease Brother   . Hypertension Brother   . Prostate cancer Brother   . Colon cancer Neg Hx   . Stomach cancer Neg Hx   . Pancreatic cancer Neg Hx    Social History   Tobacco Use  . Smoking status: Never Smoker  . Smokeless tobacco: Never Used  Substance Use Topics  . Alcohol use: Yes    Alcohol/week: 1.0 standard drinks    Types: 1 Glasses of wine per week    Comment: occasionally  . Drug use: Never   Current Outpatient Medications  Medication Sig Dispense Refill  . Coenzyme Q10-Vitamin E (QUNOL ULTRA COQ10 PO) Take 1 capsule by mouth daily after lunch.    Javier Docker Oil 500 MG CAPS Take 500 mg by mouth daily after lunch.    . levothyroxine (SYNTHROID) 50 MCG tablet TAKE 1 TABLET (50 MCG TOTAL) BY MOUTH DAILY. 90 tablet 1  . lisinopril (PRINIVIL,ZESTRIL) 10 MG tablet Take 1 tablet (10 mg total) by mouth daily. 90 tablet 3  . lisinopril-hydrochlorothiazide (PRINZIDE,ZESTORETIC) 20-12.5  MG tablet Take 1 tablet by mouth daily. 90 tablet 3  . Magnesium 250 MG TABS Take 250 mg by mouth daily after lunch.    . Multiple Vitamin (MULTIVITAMIN WITH MINERALS) TABS tablet Take 1 tablet by mouth daily after lunch. Women's One A Day Multivitamin    . Polyethyl Glycol-Propyl Glycol (LUBRICANT EYE DROPS) 0.4-0.3 % SOLN Place 1 drop into both eyes 3 (three) times daily as needed (dry eyes.).    Marland Kitchen simvastatin (ZOCOR) 20 MG tablet Take 1 tablet (20 mg total) by mouth daily. 90 tablet 3   No current facility-administered medications for this visit.    No Known Allergies   Review of Systems: All systems reviewed and negative except where noted in HPI.   Lab Results  Component Value Date   WBC 5.3 06/27/2019   HGB 15.2 06/27/2019   HCT 42.5 06/27/2019   MCV 97 06/27/2019   PLT 145 (L) 06/27/2019    Lab Results  Component Value Date   CREATININE 0.80 10/06/2019   BUN 14 06/27/2019   NA 133 (L) 06/27/2019   K 4.5 06/27/2019    CL 95 (L) 06/27/2019   CO2 22 06/27/2019    Lab Results  Component Value Date   ALT 26 06/27/2019   AST 25 06/27/2019   ALKPHOS 30 (L) 06/27/2019   BILITOT 0.8 06/27/2019     Physical Exam: BP (!) 160/74   Pulse 88   Temp (!) 97.4 F (36.3 C)   Ht 5\' 6"  (1.676 m)   Wt 174 lb (78.9 kg)   BMI 28.08 kg/m  Constitutional: Pleasant,well-developed, female in no acute distress. HEENT: Normocephalic and atraumatic. Conjunctivae are normal. No scleral icterus. Neck supple.  Cardiovascular: Normal rate, regular rhythm.  Pulmonary/chest: Effort normal and breath sounds normal. No wheezing, rales or rhonchi. Abdominal: Soft, nondistended, nontender.  There are no masses palpable. No hepatomegaly. Extremities: no edema Lymphadenopathy: No cervical adenopathy noted. Neurological: Alert and oriented to person place and time. Skin: Skin is warm and dry. No rashes noted. Psychiatric: Normal mood and affect. Behavior is normal.   ASSESSMENT AND PLAN: 73 year old female here for reassessment of the following:  Pancreatic cyst - as above, roughly 2.7 cm pancreatic cyst.  This has appeared stable on imaging since it was diagnosed earlier in 2019.  Unfortunately there was not a window on EUS to perform FNA, but this does not appear cancerous.  We did discuss there is precancerous potential and surveillance is recommended.  We discussed surveillance guidelines which are very depending on society, radiology recommends 63-month follow-up, AGA recommends 1 year follow-up, ACG recommends 6 to 22-month follow-up.  She has had no interval changes since it was last imaged a year ago, she is asymptomatic, in this light I think a follow-up exam with MRCP in 1 year is reasonable unless she develops symptoms in the interim or if she is anxious about this I offered her another exam at 6 months.  She preferred to reexamine this in 1 year and less she has a change of heart in the interim.  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  Would continue to watch this.  She does have mild hepatic steatosis and will need monitoring of LFTs regardless  History of colon polyps - given the recent change in guidelines will change her next surveillance colonoscopy  to March 2023 at that time she will be 73 years old.  We discussed if she would want further surveillance at that age.  She is otherwise in good health, she may want to consider one additional exam.  We can talk about it at that time.  She agreed  [place] Cellar, MD [facility]

## 2020-01-17 DIAGNOSIS — L570 Actinic keratosis: Secondary | ICD-10-CM | POA: Diagnosis not present

## 2020-01-17 DIAGNOSIS — L578 Other skin changes due to chronic exposure to nonionizing radiation: Secondary | ICD-10-CM | POA: Diagnosis not present

## 2020-01-20 ENCOUNTER — Ambulatory Visit: Payer: Medicare Other | Attending: Internal Medicine

## 2020-01-20 DIAGNOSIS — Z23 Encounter for immunization: Secondary | ICD-10-CM

## 2020-01-20 NOTE — Progress Notes (Signed)
   Covid-19 Vaccination Clinic  Name:  Christine Cantrell    MRN: [id number] DOB: [birth year]  01/20/2020  [name] was observed post Covid-19 immunization for 15 minutes without incidence. She was provided with Vaccine Information Sheet and instruction to access the V-Safe system.   [name] was instructed to call 911 with any severe reactions post vaccine: Marland Kitchen Difficulty breathing  . Swelling of your face and throat  . A fast heartbeat  . A bad rash all over your body  . Dizziness and weakness    Immunizations Administered    Name Date Dose VIS Date Route   Pfizer COVID-19 Vaccine 01/20/2020 11:12 AM 0.3 mL 12/09/2019 Intramuscular   Manufacturer: [place]   Lot: [id number]   [place]: [id number]

## 2020-01-24 ENCOUNTER — Other Ambulatory Visit: Payer: Self-pay | Admitting: Family Medicine

## 2020-01-24 DIAGNOSIS — E890 Postprocedural hypothyroidism: Secondary | ICD-10-CM

## 2020-02-03 ENCOUNTER — Other Ambulatory Visit: Payer: Self-pay | Admitting: Family Medicine

## 2020-02-10 ENCOUNTER — Ambulatory Visit: Payer: Medicare Other | Attending: Internal Medicine

## 2020-02-10 DIAGNOSIS — Z23 Encounter for immunization: Secondary | ICD-10-CM

## 2020-02-10 NOTE — Progress Notes (Signed)
   Covid-19 Vaccination Clinic  Name:  Christine Cantrell    MRN: [id number] DOB: 12/10/1946  02/10/2020  Ms. Christine Cantrell was observed post Covid-19 immunization for 15 minutes without incidence. She was provided with Vaccine Information Sheet and instruction to access the V-Safe system.   Ms. Christine Cantrell was instructed to call 911 with any severe reactions post vaccine: Marland Kitchen Difficulty breathing  . Swelling of your face and throat  . A fast heartbeat  . A bad rash all over your body  . Dizziness and weakness    Immunizations Administered    Name Date Dose VIS Date Route   Pfizer COVID-19 Vaccine 02/10/2020  2:39 PM 0.3 mL 12/09/2019 Intramuscular   Manufacturer: [place]   Lot: [id number]   [place]: [id number]

## 2020-03-06 ENCOUNTER — Telehealth: Payer: Self-pay | Admitting: Family Medicine

## 2020-03-06 NOTE — Chronic Care Management (AMB) (Signed)
  Chronic Care Management   Note  03/06/2020 Name: [name] MRN: [id number] DOB: Jun 07, 1946  Christine Cantrell is a 74 y.o. year old female who is a primary care patient of Janora Norlander, DO. I reached out to Christine Cantrell by phone today in response to a referral sent by Ms. Elizebath Xiong's health plan.     [name] was given information about Chronic Care Management services today including:  1. CCM service includes personalized support from designated clinical staff supervised by her physician, including individualized plan of care and coordination with other care providers 2. 24/7 contact phone numbers for assistance for urgent and routine care needs. 3. Service will only be billed when office clinical staff spend 20 minutes or more in a month to coordinate care. 4. Only one practitioner may furnish and bill the service in a calendar month. 5. The patient may stop CCM services at any time (effective at the end of the month) by phone call to the office staff. 6. The patient will be responsible for cost sharing (co-pay) of up to 20% of the service fee (after annual deductible is met).  Patient agreed to services and verbal consent obtained.   Follow up plan: Telephone appointment with care management team member scheduled for: 05/14/2020.  [place],  [id number] Direct Dial: [phone] [email or web address]

## 2020-03-13 DIAGNOSIS — L249 Irritant contact dermatitis, unspecified cause: Secondary | ICD-10-CM | POA: Diagnosis not present

## 2020-03-13 DIAGNOSIS — L820 Inflamed seborrheic keratosis: Secondary | ICD-10-CM | POA: Diagnosis not present

## 2020-03-13 DIAGNOSIS — R208 Other disturbances of skin sensation: Secondary | ICD-10-CM | POA: Diagnosis not present

## 2020-04-30 ENCOUNTER — Other Ambulatory Visit: Payer: Self-pay | Admitting: *Deleted

## 2020-04-30 MED ORDER — LISINOPRIL 10 MG PO TABS: 10.0000 mg | ORAL_TABLET | Freq: Every day | ORAL | 0 refills | Status: DC

## 2020-05-07 ENCOUNTER — Other Ambulatory Visit: Payer: Self-pay | Admitting: Family Medicine

## 2020-05-07 DIAGNOSIS — E782 Mixed hyperlipidemia: Secondary | ICD-10-CM

## 2020-05-14 ENCOUNTER — Ambulatory Visit (INDEPENDENT_AMBULATORY_CARE_PROVIDER_SITE_OTHER): Payer: Medicare Other | Admitting: *Deleted

## 2020-05-14 DIAGNOSIS — I10 Essential (primary) hypertension: Secondary | ICD-10-CM | POA: Diagnosis not present

## 2020-05-14 DIAGNOSIS — E890 Postprocedural hypothyroidism: Secondary | ICD-10-CM | POA: Diagnosis not present

## 2020-05-14 NOTE — Patient Instructions (Signed)
Visit Information  Goals Addressed            This Visit's Progress   . Chronic Disease Management Needs       CARE PLAN ENTRY (see longtitudinal plan of care for additional care plan information)  Current Barriers:  . Chronic Disease Management support, education, and care coordination needs related to HTN, GERD, hypothyroidism, sarcoidosis, HLD  Clinical Goal(s) related to HTN, GERD, hypothyroidism, sarcoidosis, HLD:  Over the next 90 days, patient will:  . Work with the care management team to address educational, disease management, and care coordination needs  . Call provider office for new or worsened signs and symptoms  . Call care management team with questions or concerns . Verbalize basic understanding of patient centered plan of care established today  Interventions related to HTN, GERD, hypothyroidism, sarcoidosis, HLD:  . Evaluation of current treatment plans and patient's adherence to plan as established by provider . Assessed patient understanding of disease states . Assessed patient's education and care coordination needs . Provided disease specific education to patient  . Collaborated with appropriate clinical care team members regarding patient needs . Provided patient with CCM contact information and encouraged to reach out as needed . Scheduled patient a routine follow-up appointment with PCP on 06/19/2020  Patient Self Care Activities related to HTN, GERD, hypothyroidism, sarcoidosis, HLD:  . Patient is able to perform ADLs and IADLs independently   Initial goal documentation        Ms. Horseman was given information about Chronic Care Management services today including:  1. CCM service includes personalized support from designated clinical staff supervised by her physician, including individualized plan of care and coordination with other care providers 2. 24/7 contact phone numbers for assistance for urgent and routine care needs. 3. Service will only  be billed when office clinical staff spend 20 minutes or more in a month to coordinate care. 4. Only one practitioner may furnish and bill the service in a calendar month. 5. The patient may stop CCM services at any time (effective at the end of the month) by phone call to the office staff. 6. The patient will be responsible for cost sharing (co-pay) of up to 20% of the service fee (after annual deductible is met).  Patient agreed to services and verbal consent obtained.   The patient verbalized understanding of instructions provided today and declined a print copy of patient instruction materials.   The care management team will reach out to the patient again over the next 90 days.  Next PCP appointment scheduled for: 06/19/20 Next AWV (Annual Wellness Visit) scheduled for: 10/8/  Chong Sicilian, BSN, RN-BC [place] [facility] Direct Dial: [phone]

## 2020-05-14 NOTE — Chronic Care Management (AMB) (Signed)
Chronic Care Management   Initial Visit Note  05/14/2020 Name: Christine Cantrell MRN: [id number] DOB: [birth year]  Referred by: Christine Norlander, DO Reason for referral : Chronic Care Management (Initial Visit)   Christine Cantrell is a 74 y.o. year old female who is a primary care patient of Christine Norlander, DO. The CCM team was consulted for assistance with chronic disease management and care coordination needs related to HTN, GERD, hypothyroidism, sarcoidosis, HLD.  Review of patient status, including review of consultants reports, relevant laboratory and other test results, and collaboration with appropriate care team members and the patient's provider was performed as part of comprehensive patient evaluation and provision of chronic care management services.    Subjective: I talked with Christine Cantrell by telephone today regarding management of her chronic medical conditions. She feels that things are well controlled at this time but she is interested in working with the CCM team and agreed to a follow-up call in a few months.   SDOH (Social Determinants of Health) assessments performed: Yes See Care Plan activities for detailed interventions related to SDOH    Objective: Outpatient Encounter Medications as of 05/14/2020  Medication Sig  . Coenzyme Q10-Vitamin E (QUNOL ULTRA COQ10 PO) Take 1 capsule by mouth daily after lunch.  Christine Cantrell Oil 500 MG CAPS Take 500 mg by mouth daily after lunch.  . levothyroxine (SYNTHROID) 50 MCG tablet TAKE 1 TABLET BY MOUTH EVERY DAY  . lisinopril (ZESTRIL) 10 MG tablet Take 1 tablet (10 mg total) by mouth daily. (Needs to be seen before next refill)  . lisinopril-hydrochlorothiazide (ZESTORETIC) 20-12.5 MG tablet Take 1 tablet by mouth daily. Needs to be seen before next refill.  . Magnesium 250 MG TABS Take 250 mg by mouth daily after lunch.  . Multiple Vitamin (MULTIVITAMIN WITH MINERALS) TABS tablet Take 1 tablet by mouth daily after lunch. Women's One A  Day Multivitamin  . Polyethyl Glycol-Propyl Glycol (LUBRICANT EYE DROPS) 0.4-0.3 % SOLN Place 1 drop into both eyes 3 (three) times daily as needed (dry eyes.).  Marland Kitchen simvastatin (ZOCOR) 20 MG tablet Take 1 tablet (20 mg total) by mouth daily. Needs to be seen before next refill.   No facility-administered encounter medications on file as of 05/14/2020.    BP Readings from Last 3 Encounters:  11/15/19 (!) 160/74  10/04/19 128/69  09/27/19 (!) 159/81   Lab Results  Component Value Date   CHOL 213 (H) 06/27/2019   HDL 80 06/27/2019   LDLCALC 99 06/27/2019   TRIG 171 (H) 06/27/2019   CHOLHDL 2.7 06/27/2019   Lab Results  Component Value Date   TSH 1.110 06/27/2019    RN Care Plan   . Chronic Disease Management Needs       CARE PLAN ENTRY (see longtitudinal plan of care for additional care plan information)  Current Barriers:  . Chronic Disease Management support, education, and care coordination needs related to HTN, GERD, hypothyroidism, sarcoidosis, HLD  Clinical Goal(s) related to HTN, GERD, hypothyroidism, sarcoidosis, HLD:  Over the next 90 days, patient will:  . Work with the care management team to address educational, disease management, and care coordination needs  . Call provider office for new or worsened signs and symptoms  . Call care management team with questions or concerns . Verbalize basic understanding of patient centered plan of care established today  Interventions related to HTN, GERD, hypothyroidism, sarcoidosis, HLD:  . Evaluation of current treatment plans and patient's adherence to plan as established by  provider . Assessed patient understanding of disease states . Assessed patient's education and care coordination needs . Provided disease specific education to patient  . Collaborated with appropriate clinical care team members regarding patient needs . Provided patient with CCM contact information and encouraged to reach out as needed . Scheduled  patient a routine follow-up appointment with PCP on 06/19/2020  Patient Self Care Activities related to HTN, GERD, hypothyroidism, sarcoidosis, HLD:  . Patient is able to perform ADLs and IADLs independently   Initial goal documentation        Plan:   The care management team will reach out to the patient again over the next 90 days.  Next PCP appointment scheduled for: 06/19/20 Next AWV (Annual Wellness Visit) scheduled for: 10/05/20  Christine Cantrell, BSN, RN-BC [place] [facility] [place] [facility] Direct Dial: [phone]

## 2020-05-17 ENCOUNTER — Other Ambulatory Visit: Payer: Self-pay

## 2020-05-21 ENCOUNTER — Encounter: Payer: Self-pay | Admitting: Endocrinology

## 2020-05-21 ENCOUNTER — Ambulatory Visit: Payer: Medicare Other | Admitting: Endocrinology

## 2020-05-21 ENCOUNTER — Other Ambulatory Visit: Payer: Self-pay

## 2020-05-21 VITALS — BP 140/80 | HR 96 | Ht 66.0 in | Wt 169.0 lb

## 2020-05-21 DIAGNOSIS — E042 Nontoxic multinodular goiter: Secondary | ICD-10-CM | POA: Diagnosis not present

## 2020-05-21 NOTE — Patient Instructions (Addendum)
Please have your thyroid rechecked as part of your annual blood tests next month.  Let's recheck the ultrasound.  you will receive a phone call, about a day and time for an appointment.  If the biopsy is again recommended, please come back here to have it done.   Dr Havery Moros is your GI specialist.

## 2020-05-21 NOTE — Progress Notes (Signed)
Subjective:    Patient ID: Christine Cantrell, female    DOB: 07/18/1946, 74 y.o.   MRN: [id number]  HPI Pt returns for f/u of goiter (she had a partial thyroidectomy in the 1980's; she has been on synthroid since then; US showed nodules in remaining tissue; bx of the nodules in 2004 was benign; most recent US in 2020 advised bx of right nodule, but radiologist did not note prior bx).  She does not notice the goiter.   Past Medical History:  Diagnosis Date  . Abnormal EKG saw dr hochrein 3 yrs ago for   hx of left bundle branch block on ekg since 2002  . Depression   . Diverticulosis large intestine w/o perforation or abscess w/bleeding 2016  . GERD (gastroesophageal reflux disease)    hx of  . High cholesterol   . History of hiatal hernia   . Hx of colonic polyps 2016   adenomatous  . Hypertension   . Pancreatic cyst   . Thyroid disease    hypothyroidism    Past Surgical History:  Procedure Laterality Date  . ABDOMINAL HYSTERECTOMY  2002   complete for plyps  . ESOPHAGOGASTRODUODENOSCOPY N/A 09/30/2018   Procedure: ESOPHAGOGASTRODUODENOSCOPY (EGD);  Surgeon: Milus Banister, MD;  Location: [facility];  Service: Endoscopy;  Laterality: N/A;  . EUS N/A 09/30/2018   Procedure: UPPER ENDOSCOPIC ULTRASOUND (EUS) RADIAL;  Surgeon: Milus Banister, MD;  Location: [facility];  Service: Endoscopy;  Laterality: N/A;  . LIVER BIOPSY     normal result  . thryoid needle biopsy  1990's  . THYROIDECTOMY  1982    Social History   Socioeconomic History  . Marital status: Single    Spouse name: Not on file  . Number of children: 0  . Years of education: 16  . Highest education level: Bachelor's degree (e.g., BA, AB, BS)  Occupational History  . Occupation: retired    Comment: Therapist, nutritional  Tobacco Use  . Smoking status: Never Smoker  . Smokeless tobacco: Never Used  Substance and Sexual Activity  . Alcohol use: Yes    Alcohol/week: 1.0 standard drinks    Types: 1 Glasses  of wine per week    Comment: occasionally  . Drug use: Never  . Sexual activity: Not Currently    Partners: Male    Birth control/protection: Surgical  Other Topics Concern  . Not on file  Social History Narrative   Lives alone.  No children   Social Determinants of Health   Financial Resource Strain: Low Risk   . Difficulty of Paying Living Expenses: Not hard at all  Food Insecurity: No Food Insecurity  . Worried About Charity fundraiser in the Last Year: Never true  . Ran Out of Food in the Last Year: Never true  Transportation Needs: No Transportation Needs  . Lack of Transportation (Medical): No  . Lack of Transportation (Non-Medical): No  Physical Activity: Sufficiently Active  . Days of Exercise per Week: 5 days  . Minutes of Exercise per Session: 40 min  Stress: No Stress Concern Present  . Feeling of Stress : Not at all  Social Connections: Slightly Isolated  . Frequency of Communication with Friends and Family: More than three times a week  . Frequency of Social Gatherings with Friends and Family: More than three times a week  . Attends Religious Services: More than 4 times per year  . Active Member of Clubs or Organizations: Yes  . Attends Club or  Organization Meetings: More than 4 times per year  . Marital Status: Never married  Intimate Partner Violence: Not At Risk  . Fear of Current or Ex-Partner: No  . Emotionally Abused: No  . Physically Abused: No  . Sexually Abused: No    Current Outpatient Medications on File Prior to Visit  Medication Sig Dispense Refill  . Coenzyme Q10-Vitamin E (QUNOL ULTRA COQ10 PO) Take 1 capsule by mouth daily after lunch.    Javier Docker Oil 500 MG CAPS Take 500 mg by mouth daily after lunch.    . levothyroxine (SYNTHROID) 50 MCG tablet TAKE 1 TABLET BY MOUTH EVERY DAY 90 tablet 1  . lisinopril (ZESTRIL) 10 MG tablet Take 1 tablet (10 mg total) by mouth daily. (Needs to be seen before next refill) 30 tablet 0  .  lisinopril-hydrochlorothiazide (ZESTORETIC) 20-12.5 MG tablet Take 1 tablet by mouth daily. Needs to be seen before next refill. 30 tablet 0  . Magnesium 250 MG TABS Take 250 mg by mouth daily after lunch.    . Multiple Vitamin (MULTIVITAMIN WITH MINERALS) TABS tablet Take 1 tablet by mouth daily after lunch. Women's One A Day Multivitamin    . Polyethyl Glycol-Propyl Glycol (LUBRICANT EYE DROPS) 0.4-0.3 % SOLN Place 1 drop into both eyes 3 (three) times daily as needed (dry eyes.).    Marland Kitchen simvastatin (ZOCOR) 20 MG tablet Take 1 tablet (20 mg total) by mouth daily. Needs to be seen before next refill. 30 tablet 0   No current facility-administered medications on file prior to visit.    No Known Allergies  Family History  Problem Relation Age of Onset  . High Cholesterol Mother   . Hypertension Mother   . Goiter Mother   . High Cholesterol Father   . Hypertension Father   . Hypertension Brother   . Lung disease Brother        chronic smoker  . Heart disease Brother   . Goiter Sister   . Heart disease Brother   . Hypertension Brother   . Prostate cancer Brother   . Colon cancer Neg Hx   . Stomach cancer Neg Hx   . Pancreatic cancer Neg Hx     BP 140/80   Pulse 96   Ht 5\' 6"  (1.676 m)   Wt 169 lb (76.7 kg)   SpO2 98%   BMI 27.28 kg/m    Review of Systems     Objective:   Physical Exam VITAL SIGNS:  See vs page GENERAL: no distress Neck: a healed scar is present.  The 2 thyroid nodules are again noted--1 on each side.         Assessment & Plan:  MNG: due for recheck Hypothyroidism: due for recheck  Patient Instructions  Please have your thyroid rechecked as part of your annual blood tests next month.  Let's recheck the ultrasound.  you will receive a phone call, about a day and time for an appointment.  If the biopsy is again recommended, please come back here to have it done.   Dr Havery Moros is your GI specialist.

## 2020-05-24 ENCOUNTER — Ambulatory Visit
Admission: RE | Admit: 2020-05-24 | Discharge: 2020-05-24 | Disposition: A | Discharge status: Home / Self Care | Payer: Medicare Other | Source: Ambulatory Visit | Attending: Endocrinology | Admitting: Endocrinology

## 2020-05-24 DIAGNOSIS — E041 Nontoxic single thyroid nodule: Secondary | ICD-10-CM | POA: Diagnosis not present

## 2020-05-24 DIAGNOSIS — E042 Nontoxic multinodular goiter: Secondary | ICD-10-CM

## 2020-05-29 ENCOUNTER — Other Ambulatory Visit: Payer: Self-pay

## 2020-05-31 ENCOUNTER — Other Ambulatory Visit (HOSPITAL_COMMUNITY)
Admission: RE | Admit: 2020-05-31 | Discharge: 2020-05-31 | Disposition: A | Discharge status: Home / Self Care | Payer: Medicare Other | Source: Ambulatory Visit | Attending: Endocrinology | Admitting: Endocrinology

## 2020-05-31 ENCOUNTER — Encounter: Payer: Self-pay | Admitting: Endocrinology

## 2020-05-31 ENCOUNTER — Other Ambulatory Visit: Payer: Self-pay

## 2020-05-31 ENCOUNTER — Ambulatory Visit: Payer: Medicare Other | Admitting: Endocrinology

## 2020-05-31 VITALS — BP 160/90 | HR 93 | Wt 169.6 lb

## 2020-05-31 DIAGNOSIS — E042 Nontoxic multinodular goiter: Secondary | ICD-10-CM | POA: Diagnosis not present

## 2020-05-31 NOTE — Patient Instructions (Signed)
We'll let you know about the biopsy results.  If as expected, no cancer is found, please come back for a follow-up appointment in 6-12 months.

## 2020-05-31 NOTE — Progress Notes (Signed)
   Subjective:    Patient ID: Christine Cantrell, female    DOB: 11/23/1946, 74 y.o.   MRN: [id number]  HPI  thyroid needle bx: Location: thyroid consent obtained, signed form on chart The area is first sprayed with cooling agent local: xylocaine 2%, with epinephrine prep: alcohol pad 4 bxs are done with 25 and [id number] needles no complications.     Review of Systems     Objective:   Physical Exam      Assessment & Plan:    Patient Instructions  We'll let you know about the biopsy results.  If as expected, no cancer is found, please come back for a follow-up appointment in 6-12 months.

## 2020-06-01 LAB — CYTOLOGY - NON PAP

## 2020-06-14 DIAGNOSIS — L814 Other melanin hyperpigmentation: Secondary | ICD-10-CM | POA: Diagnosis not present

## 2020-06-14 DIAGNOSIS — L820 Inflamed seborrheic keratosis: Secondary | ICD-10-CM | POA: Diagnosis not present

## 2020-06-14 DIAGNOSIS — L821 Other seborrheic keratosis: Secondary | ICD-10-CM | POA: Diagnosis not present

## 2020-06-14 DIAGNOSIS — L570 Actinic keratosis: Secondary | ICD-10-CM | POA: Diagnosis not present

## 2020-06-15 DIAGNOSIS — Z1231 Encounter for screening mammogram for malignant neoplasm of breast: Secondary | ICD-10-CM | POA: Diagnosis not present

## 2020-06-19 ENCOUNTER — Encounter: Payer: Self-pay | Admitting: Family Medicine

## 2020-06-19 ENCOUNTER — Other Ambulatory Visit: Payer: Self-pay

## 2020-06-19 ENCOUNTER — Ambulatory Visit (INDEPENDENT_AMBULATORY_CARE_PROVIDER_SITE_OTHER): Payer: Medicare Other | Admitting: Family Medicine

## 2020-06-19 VITALS — BP 132/73 | HR 87 | Temp 98.1°F | Ht 66.0 in | Wt 168.0 lb

## 2020-06-19 DIAGNOSIS — D8689 Sarcoidosis of other sites: Secondary | ICD-10-CM

## 2020-06-19 DIAGNOSIS — I10 Essential (primary) hypertension: Secondary | ICD-10-CM | POA: Diagnosis not present

## 2020-06-19 DIAGNOSIS — D869 Sarcoidosis, unspecified: Secondary | ICD-10-CM | POA: Diagnosis not present

## 2020-06-19 DIAGNOSIS — E782 Mixed hyperlipidemia: Secondary | ICD-10-CM

## 2020-06-19 DIAGNOSIS — E890 Postprocedural hypothyroidism: Secondary | ICD-10-CM

## 2020-06-19 DIAGNOSIS — Z86010 Personal history of colonic polyps: Secondary | ICD-10-CM

## 2020-06-19 DIAGNOSIS — R739 Hyperglycemia, unspecified: Secondary | ICD-10-CM | POA: Diagnosis not present

## 2020-06-19 DIAGNOSIS — K869 Disease of pancreas, unspecified: Secondary | ICD-10-CM

## 2020-06-19 LAB — BAYER DCA HB A1C WAIVED: HB A1C (BAYER DCA - WAIVED): 5.4 % (ref <7.0)

## 2020-06-19 MED ORDER — SIMVASTATIN 20 MG PO TABS: 20.0000 mg | ORAL_TABLET | Freq: Every day | ORAL | 3 refills | Status: DC

## 2020-06-19 MED ORDER — LISINOPRIL-HYDROCHLOROTHIAZIDE 20-12.5 MG PO TABS: 1.0000 | ORAL_TABLET | Freq: Every day | ORAL | 3 refills | Status: DC

## 2020-06-19 MED ORDER — LISINOPRIL 10 MG PO TABS: 10.0000 mg | ORAL_TABLET | Freq: Every day | ORAL | 3 refills | Status: DC

## 2020-06-19 NOTE — Progress Notes (Signed)
Subjective: CC: f/u HTN PCP: Janora Norlander, DO [id number] Test is a 74 y.o. female presenting to clinic today for:  1.  Hypertension with hyperlipidemia Patient reports compliance with lisinopril-hydrochlorothiazide 30/12.5 every morning and zocor each evening.  She denies any chest pain, shortness of breath, headache, visual disturbance, lower extremity edema.   2.  Pancreatic cyst Patient had evaluation and biopsy of pancreatic lesion in 2019 with Dr. Havery Moros.    She needs a colonoscopy.  She has history of colonic polyps back in [place].  Does not report any rectal bleeding or unplanned weight loss.  3.  Hypothyroidism Patient with history of thyroid nodules which are followed closely by Dr. Renato Shin.    Her last checkup was good with endocrinology.  Ultrasound was stable.  She is compliant with Synthroid brand name 50 mcg daily.  Denies any change in voice, difficulty swallowing, tremor, heart palpitation, unplanned weight change or bowel habit changes.   ROS: Per HPI  No Known Allergies Past Medical History:  Diagnosis Date  . Abnormal EKG saw dr hochrein 3 yrs ago for   hx of left bundle branch block on ekg since 2002  . Depression   . Diverticulosis large intestine w/o perforation or abscess w/bleeding 2016  . GERD (gastroesophageal reflux disease)    hx of  . High cholesterol   . History of hiatal hernia   . Hx of colonic polyps 2016   adenomatous  . Hypertension   . Pancreatic cyst   . Thyroid disease    hypothyroidism    Current Outpatient Medications:  .  Coenzyme Q10-Vitamin E (QUNOL ULTRA COQ10 PO), Take 1 capsule by mouth daily after lunch., Disp: , Rfl:  .  Krill Oil 500 MG CAPS, Take 500 mg by mouth daily after lunch., Disp: , Rfl:  .  levothyroxine (SYNTHROID) 50 MCG tablet, TAKE 1 TABLET BY MOUTH EVERY DAY, Disp: 90 tablet, Rfl: 1 .  lisinopril (ZESTRIL) 10 MG tablet, Take 1 tablet (10 mg total) by mouth daily. (Needs to be seen  before next refill), Disp: 30 tablet, Rfl: 0 .  lisinopril-hydrochlorothiazide (ZESTORETIC) 20-12.5 MG tablet, Take 1 tablet by mouth daily. Needs to be seen before next refill., Disp: 30 tablet, Rfl: 0 .  Magnesium 250 MG TABS, Take 250 mg by mouth daily after lunch., Disp: , Rfl:  .  Multiple Vitamin (MULTIVITAMIN WITH MINERALS) TABS tablet, Take 1 tablet by mouth daily after lunch. Women's One A Day Multivitamin, Disp: , Rfl:  .  Polyethyl Glycol-Propyl Glycol (LUBRICANT EYE DROPS) 0.4-0.3 % SOLN, Place 1 drop into both eyes 3 (three) times daily as needed (dry eyes.)., Disp: , Rfl:  .  simvastatin (ZOCOR) 20 MG tablet, Take 1 tablet (20 mg total) by mouth daily. Needs to be seen before next refill., Disp: 30 tablet, Rfl: 0 Social History   Socioeconomic History  . Marital status: Single    Spouse name: Not on file  . Number of children: 0  . Years of education: 16  . Highest education level: Bachelor's degree (e.g., BA, AB, BS)  Occupational History  . Occupation: retired    Comment: Therapist, nutritional  Tobacco Use  . Smoking status: Never Smoker  . Smokeless tobacco: Never Used  Vaping Use  . Vaping Use: Never used  Substance and Sexual Activity  . Alcohol use: Yes    Alcohol/week: 1.0 standard drink    Types: 1 Glasses of wine per week    Comment: occasionally  .  Drug use: Never  . Sexual activity: Not Currently    Partners: Male    Birth control/protection: Surgical  Other Topics Concern  . Not on file  Social History Narrative   Lives alone.  No children   Social Determinants of Health   Financial Resource Strain: Low Risk   . Difficulty of Paying Living Expenses: Not hard at all  Food Insecurity: No Food Insecurity  . Worried About Charity fundraiser in the Last Year: Never true  . Ran Out of Food in the Last Year: Never true  Transportation Needs: No Transportation Needs  . Lack of Transportation (Medical): No  . Lack of Transportation (Non-Medical): No    Physical Activity: Sufficiently Active  . Days of Exercise per Week: 5 days  . Minutes of Exercise per Session: 40 min  Stress: No Stress Concern Present  . Feeling of Stress : Not at all  Social Connections: Moderately Integrated  . Frequency of Communication with Friends and Family: More than three times a week  . Frequency of Social Gatherings with Friends and Family: More than three times a week  . Attends Religious Services: More than 4 times per year  . Active Member of Clubs or Organizations: Yes  . Attends Archivist Meetings: More than 4 times per year  . Marital Status: Never married  Intimate Partner Violence: Not At Risk  . Fear of Current or Ex-Partner: No  . Emotionally Abused: No  . Physically Abused: No  . Sexually Abused: No   Family History  Problem Relation Age of Onset  . High Cholesterol Mother   . Hypertension Mother   . Goiter Mother   . High Cholesterol Father   . Hypertension Father   . Hypertension Brother   . Lung disease Brother        chronic smoker  . Heart disease Brother   . Goiter Sister   . Heart disease Brother   . Hypertension Brother   . Prostate cancer Brother   . Colon cancer Neg Hx   . Stomach cancer Neg Hx   . Pancreatic cancer Neg Hx     Objective: Office vital signs reviewed. BP 132/73   Pulse 87   Temp 98.1 F (36.7 C)   Ht 5' 6"  (1.676 m)   Wt 168 lb (76.2 kg)   SpO2 97%   BMI 27.12 kg/m   Physical Examination:  General: Awake, alert, well nourished, No acute distress HEENT: Normal; no goiter.  No exophthalmos. Cardio: regular rate and rhythm, S1S2 heard, no murmurs appreciated Pulm: clear to auscultation bilaterally, no wheezes, rhonchi or rales; normal work of breathing on room air Extremities: warm, well perfused, No edema, cyanosis or clubbing; +2 pulses bilaterally Skin: Normal temperature Neuro: No resting tremor  Assessment/ Plan: 74 y.o. female   1. Essential hypertension Well-controlled.   Continue current regimen.  Refill sent to pharmacy. - CMP14+EGFR  2. Postoperative hypothyroidism Asymptomatic.  Good checkup with thyroid ultrasound recently.  Check thyroid panel - CMP14+EGFR - Thyroid Panel With TSH  3. Mixed hyperlipidemia Check fasting lipid panel.  Zocor renewed - CMP14+EGFR - Lipid Panel - simvastatin (ZOCOR) 20 MG tablet; Take 1 tablet (20 mg total) by mouth daily.  Dispense: 90 tablet; Refill: 3  4. Sarcoidosis of digestive system Continue to follow-up with GI as directed - CMP14+EGFR - CBC with Differential  5. Pancreatic lesion - CMP14+EGFR - CBC with Differential - Bayer DCA Hb A1c Waived  6. History of  colon polyps Referral placed back to Dr. Doyne Keel office for colonoscopy - Ambulatory referral to Gastroenterology    No orders of the defined types were placed in this encounter.  No orders of the defined types were placed in this encounter.    Janora Norlander, DO [place] [phone]

## 2020-06-20 DIAGNOSIS — R928 Other abnormal and inconclusive findings on diagnostic imaging of breast: Secondary | ICD-10-CM

## 2020-06-20 LAB — CMP14+EGFR
ALT: 33 IU/L — ABNORMAL HIGH (ref 0–32)
AST: 38 IU/L (ref 0–40)
Albumin/Globulin Ratio: 1.7 (ref 1.2–2.2)
Albumin: 4.3 g/dL (ref 3.7–4.7)
Alkaline Phosphatase: 36 IU/L — ABNORMAL LOW (ref 48–121)
BUN/Creatinine Ratio: 13 (ref 12–28)
BUN: 9 mg/dL (ref 8–27)
Bilirubin Total: 0.6 mg/dL (ref 0.0–1.2)
CO2: 22 mmol/L (ref 20–29)
Calcium: 9.8 mg/dL (ref 8.7–10.3)
Chloride: 97 mmol/L (ref 96–106)
Creatinine, Ser: 0.68 mg/dL (ref 0.57–1.00)
GFR calc Af Amer: 98 mL/min/{1.73_m2} (ref >59)
GFR calc non Af Amer: 85 mL/min/{1.73_m2} (ref >59)
Globulin, Total: 2.5 g/dL (ref 1.5–4.5)
Glucose: 129 mg/dL — ABNORMAL HIGH (ref 65–99)
Potassium: 4.5 mmol/L (ref 3.5–5.2)
Sodium: 134 mmol/L (ref 134–144)
Total Protein: 6.8 g/dL (ref 6.0–8.5)

## 2020-06-20 LAB — LIPID PANEL
Chol/HDL Ratio: 2.7 ratio (ref 0.0–4.4)
Cholesterol, Total: 202 mg/dL — ABNORMAL HIGH (ref 100–199)
HDL: 74 mg/dL (ref >39)
LDL Chol Calc (NIH): 81 mg/dL (ref 0–99)
Triglycerides: 294 mg/dL — ABNORMAL HIGH (ref 0–149)
VLDL Cholesterol Cal: 47 mg/dL — ABNORMAL HIGH (ref 5–40)

## 2020-06-20 LAB — CBC WITH DIFFERENTIAL/PLATELET
Basophils Absolute: 0 10*3/uL (ref 0.0–0.2)
Basos: 1 %
EOS (ABSOLUTE): 0.1 10*3/uL (ref 0.0–0.4)
Eos: 3 %
Hematocrit: 41.4 % (ref 34.0–46.6)
Hemoglobin: 15 g/dL (ref 11.1–15.9)
Immature Grans (Abs): 0 10*3/uL (ref 0.0–0.1)
Immature Granulocytes: 0 %
Lymphocytes Absolute: 0.5 10*3/uL — ABNORMAL LOW (ref 0.7–3.1)
Lymphs: 15 %
MCH: 33.9 pg — ABNORMAL HIGH (ref 26.6–33.0)
MCHC: 36.2 g/dL — ABNORMAL HIGH (ref 31.5–35.7)
MCV: 94 fL (ref 79–97)
Monocytes Absolute: 0.5 10*3/uL (ref 0.1–0.9)
Monocytes: 14 %
Neutrophils Absolute: 2.3 10*3/uL (ref 1.4–7.0)
Neutrophils: 67 %
Platelets: 146 10*3/uL — ABNORMAL LOW (ref 150–450)
RBC: 4.43 x10E6/uL (ref 3.77–5.28)
RDW: 11.7 % (ref 11.7–15.4)
WBC: 3.5 10*3/uL (ref 3.4–10.8)

## 2020-06-20 LAB — THYROID PANEL WITH TSH
Free Thyroxine Index: 2.1 (ref 1.2–4.9)
T3 Uptake Ratio: 28 % (ref 24–39)
T4, Total: 7.4 ug/dL (ref 4.5–12.0)
TSH: 0.434 u[IU]/mL — ABNORMAL LOW (ref 0.450–4.500)

## 2020-06-21 ENCOUNTER — Telehealth: Payer: Self-pay | Admitting: *Deleted

## 2020-06-21 ENCOUNTER — Telehealth: Payer: Self-pay | Admitting: Family Medicine

## 2020-06-21 ENCOUNTER — Other Ambulatory Visit: Payer: Self-pay

## 2020-06-21 DIAGNOSIS — E782 Mixed hyperlipidemia: Secondary | ICD-10-CM

## 2020-06-21 DIAGNOSIS — E890 Postprocedural hypothyroidism: Secondary | ICD-10-CM

## 2020-06-21 DIAGNOSIS — N6312 Unspecified lump in the right breast, upper inner quadrant: Secondary | ICD-10-CM | POA: Diagnosis not present

## 2020-06-21 DIAGNOSIS — N6314 Unspecified lump in the right breast, lower inner quadrant: Secondary | ICD-10-CM | POA: Diagnosis not present

## 2020-06-21 NOTE — Telephone Encounter (Signed)
I am glad to perform a stool card for her if she is concerned but as far as colonoscopies go, I would follow the recommendations of the specialists.

## 2020-06-21 NOTE — Telephone Encounter (Signed)
See results notes. Pt informed

## 2020-06-21 NOTE — Telephone Encounter (Signed)
Spoke to pt and she says she called to schedule her Colonoscopy with GI as she was supposed to have one in 5 years but GI told her the guidelines have changed and she doesn't need one till 2023. Pt was a little concerned and wanted to see what you thought about it.

## 2020-06-22 NOTE — Telephone Encounter (Signed)
Pt coming by the office today to pick up stool card.

## 2020-06-22 NOTE — Telephone Encounter (Signed)
Provider suggestions left on patient's voice mail.  Call back if interested in doing a stool specimen to check for problems.

## 2020-06-28 NOTE — Progress Notes (Signed)
Called patient to follow up on results form breast imaging - patient has appointment with [facility] 07/04/2020 for recommended biopsy

## 2020-06-29 ENCOUNTER — Telehealth: Payer: Self-pay

## 2020-06-29 NOTE — Telephone Encounter (Signed)
Patient came by to pick up results from mammogram and breast US - after talking to patient I called and verified biopsy appointment is for 07/04/2020 @ 10 with [facility] and I called and verified with patient that she is aware of this appointment

## 2020-07-04 ENCOUNTER — Other Ambulatory Visit: Payer: Self-pay | Admitting: Radiology

## 2020-07-04 DIAGNOSIS — C50811 Malignant neoplasm of overlapping sites of right female breast: Secondary | ICD-10-CM | POA: Diagnosis not present

## 2020-07-04 DIAGNOSIS — N6312 Unspecified lump in the right breast, upper inner quadrant: Secondary | ICD-10-CM | POA: Diagnosis not present

## 2020-07-05 ENCOUNTER — Encounter: Payer: Self-pay | Admitting: *Deleted

## 2020-07-06 ENCOUNTER — Other Ambulatory Visit: Payer: Self-pay | Admitting: *Deleted

## 2020-07-06 DIAGNOSIS — Z170 Estrogen receptor positive status [ER+]: Secondary | ICD-10-CM | POA: Insufficient documentation

## 2020-07-06 DIAGNOSIS — C50211 Malignant neoplasm of upper-inner quadrant of right female breast: Secondary | ICD-10-CM | POA: Insufficient documentation

## 2020-07-09 NOTE — Progress Notes (Signed)
Radiation Oncology         [phone] ________________________________  Multidisciplinary Breast Oncology Clinic [facility]) Initial Outpatient Consultation  Name: Christine Cantrell MRN: [id number]  Date: 07/11/2020  DOB: September 21, 1946  LP:[id number], Koleen Distance, DO  Stark Klein, MD   REFERRING PHYSICIAN: Stark Klein, MD  DIAGNOSIS: The encounter diagnosis was Malignant neoplasm of upper-inner quadrant of right breast in female, estrogen receptor positive ([place]).  Stage T1b, Nx, Mx, Right Breast UIQ, Invasive Ductal Carcinoma, ER+ / PR+ / Her2-, Grade 2    ICD-10-CM   1. Malignant neoplasm of upper-inner quadrant of right breast in female, estrogen receptor positive (HCC)  C50.211    Z17.0     HISTORY OF PRESENT ILLNESS::Christine Cantrell is a 74 y.o. female who is presenting to the office today for evaluation of her newly diagnosed breast cancer. She is accompanied by a good friend. She is doing well overall.   She had routine screening mammography on 06/15/2020 that showed an indeterminate 8 mm oval mass in the right breast. She underwent right breast ultrasonography at [facility] on 06/21/2020 that showed a 0.7 cm x 0.5 cm x 0.7 cm irregular mass in the right breast that was suspicious for malignancy.  Biopsy on 07/04/2020 showed grade 2 invasive ductal carcinoma. Prognostic indicators were significant for estrogen receptor, 95% positive and progesterone receptor, 95% positive, both with strong staining intensities. Proliferation marker Ki67 at 30%. HER2 negative.  Menarche: 74 years old Age at first live birth: N/A GP: 0 LMP: N/A Contraceptive: None HRT: None   The patient was referred today for presentation in the multidisciplinary conference.  Radiology studies and pathology slides were presented there for review and discussion of treatment options.  A consensus was discussed regarding potential next steps.  PREVIOUS RADIATION THERAPY: No  PAST MEDICAL HISTORY:  Past Medical History:    Diagnosis Date  . Abnormal EKG saw dr hochrein 3 yrs ago for   hx of left bundle branch block on ekg since 2002  . Breast cancer ([place])   . Depression   . Diverticulosis large intestine w/o perforation or abscess w/bleeding 2016  . GERD (gastroesophageal reflux disease)    hx of  . High cholesterol   . History of hiatal hernia   . Hx of colonic polyps 2016   adenomatous  . Hypertension   . Pancreatic cyst   . Thyroid disease    hypothyroidism    PAST SURGICAL HISTORY: Past Surgical History:  Procedure Laterality Date  . ABDOMINAL HYSTERECTOMY  2002   complete for plyps  . ESOPHAGOGASTRODUODENOSCOPY N/A 09/30/2018   Procedure: ESOPHAGOGASTRODUODENOSCOPY (EGD);  Surgeon: Milus Banister, MD;  Location: [facility];  Service: Endoscopy;  Laterality: N/A;  . EUS N/A 09/30/2018   Procedure: UPPER ENDOSCOPIC ULTRASOUND (EUS) RADIAL;  Surgeon: Milus Banister, MD;  Location: [facility];  Service: Endoscopy;  Laterality: N/A;  . LIVER BIOPSY     normal result  . thryoid needle biopsy  1990's  . THYROIDECTOMY  1982    FAMILY HISTORY:  Family History  Problem Relation Age of Onset  . High Cholesterol Mother   . Hypertension Mother   . Goiter Mother   . High Cholesterol Father   . Hypertension Father   . Hypertension Brother   . Lung disease Brother        chronic smoker  . Heart disease Brother   . Goiter Sister   . Heart disease Brother   . Hypertension Brother   . Prostate cancer  Brother   . Colon cancer Neg Hx   . Stomach cancer Neg Hx   . Pancreatic cancer Neg Hx     SOCIAL HISTORY:  Social History   Socioeconomic History  . Marital status: Single    Spouse name: Not on file  . Number of children: 0  . Years of education: 16  . Highest education level: Bachelor's degree (e.g., BA, AB, BS)  Occupational History  . Occupation: retired    Comment: Therapist, nutritional  Tobacco Use  . Smoking status: Never Smoker  . Smokeless tobacco: Never Used  Vaping  Use  . Vaping Use: Never used  Substance and Sexual Activity  . Alcohol use: Yes    Alcohol/week: 1.0 standard drink    Types: 1 Glasses of wine per week    Comment: occasionally  . Drug use: Never  . Sexual activity: Not Currently    Partners: Male    Birth control/protection: Surgical  Other Topics Concern  . Not on file  Social History Narrative   Lives alone.  No children   Social Determinants of Health   Financial Resource Strain: Low Risk   . Difficulty of Paying Living Expenses: Not hard at all  Food Insecurity: No Food Insecurity  . Worried About Charity fundraiser in the Last Year: Never true  . Ran Out of Food in the Last Year: Never true  Transportation Needs: No Transportation Needs  . Lack of Transportation (Medical): No  . Lack of Transportation (Non-Medical): No  Physical Activity: Sufficiently Active  . Days of Exercise per Week: 5 days  . Minutes of Exercise per Session: 40 min  Stress: No Stress Concern Present  . Feeling of Stress : Not at all  Social Connections: Moderately Integrated  . Frequency of Communication with Friends and Family: More than three times a week  . Frequency of Social Gatherings with Friends and Family: More than three times a week  . Attends Religious Services: More than 4 times per year  . Active Member of Clubs or Organizations: Yes  . Attends Archivist Meetings: More than 4 times per year  . Marital Status: Never married    ALLERGIES: No Known Allergies  MEDICATIONS:  Current Outpatient Medications  Medication Sig Dispense Refill  . Coenzyme Q10-Vitamin E (QUNOL ULTRA COQ10 PO) Take 1 capsule by mouth daily after lunch.    Javier Docker Oil 500 MG CAPS Take 500 mg by mouth daily after lunch.    . levothyroxine (SYNTHROID) 75 MCG tablet Take 75 mcg by mouth daily before breakfast.    . lisinopril (ZESTRIL) 10 MG tablet Take 1 tablet (10 mg total) by mouth daily. 90 tablet 3  . lisinopril-hydrochlorothiazide  (ZESTORETIC) 20-12.5 MG tablet Take 1 tablet by mouth daily. 90 tablet 3  . Magnesium 250 MG TABS Take 250 mg by mouth daily after lunch.    . Multiple Vitamin (MULTIVITAMIN WITH MINERALS) TABS tablet Take 1 tablet by mouth daily after lunch. Women's One A Day Multivitamin    . Polyethyl Glycol-Propyl Glycol (LUBRICANT EYE DROPS) 0.4-0.3 % SOLN Place 1 drop into both eyes 3 (three) times daily as needed (dry eyes.).    Marland Kitchen simvastatin (ZOCOR) 20 MG tablet Take 1 tablet (20 mg total) by mouth daily. 90 tablet 3   No current facility-administered medications for this encounter.    REVIEW OF SYSTEMS: A 10+ POINT REVIEW OF SYSTEMS WAS OBTAINED including neurology, dermatology, psychiatry, cardiac, respiratory, lymph, extremities, GI, GU, musculoskeletal,  constitutional, reproductive, HEENT. On the provided form, she reports lump in right breast, and anxiety. She also reports wearing glasses and a history of skin cancer, psoriasis, poor circulation, and thyroid problems. She denies fever, chills, chest pain, shortness of breath, cough, nausea, vomiting, diarrhea, abdominal pain, dysuria, and any other symptoms.    PHYSICAL EXAM:   Vitals with BMI 07/11/2020  Height _0   Weight 163 lbs 8 oz  BMI 82.4  Systolic 235  Diastolic 82  Pulse 84   Lungs are clear to auscultation bilaterally. Heart has regular rate and rhythm. No palpable cervical, supraclavicular, or axillary adenopathy. Abdomen soft, non-tender, normal bowel sounds. Left breast with no palpable mass, nipple discharge, or bleeding.  Right breast with bruising in the inner aspect of the breast. No palpable mass, nipple discharge, or bleeding.   KPS = 90  100 - Normal; no complaints; no evidence of disease. 90   - Able to carry on normal activity; minor signs or symptoms of disease. 80   - Normal activity with effort; some signs or symptoms of disease. 60   - Cares for self; unable to carry on normal activity or to do active work. 60    - Requires occasional assistance, but is able to care for most of his personal needs. 50   - Requires considerable assistance and frequent medical care. 11   - Disabled; requires special care and assistance. 67   - Severely disabled; hospital admission is indicated although death not imminent. 11   - Very sick; hospital admission necessary; active supportive treatment necessary. 10   - Moribund; fatal processes progressing rapidly. 0     - Dead  Karnofsky DA, Abelmann [place], Craver LS and Burchenal [facility] [phone]) The use of the nitrogen mustards in the palliative treatment of carcinoma: with particular reference to bronchogenic carcinoma Cancer 1 [id number]  LABORATORY DATA:  Lab Results  Component Value Date   WBC 5.2 07/11/2020   HGB 15.4 (H) 07/11/2020   HCT 43.8 07/11/2020   MCV 94.4 07/11/2020   PLT 146 (L) 07/11/2020   Lab Results  Component Value Date   NA 132 (L) 07/11/2020   K 3.9 07/11/2020   CL 95 (L) 07/11/2020   CO2 25 07/11/2020   Lab Results  Component Value Date   ALT 40 07/11/2020   AST 39 07/11/2020   ALKPHOS 32 (L) 07/11/2020   BILITOT 1.6 (H) 07/11/2020    PULMONARY FUNCTION TEST:   Recent Review Flowsheet Data   There is no flowsheet data to display.     RADIOGRAPHY: No results found.    IMPRESSION: Stage T1b, Nx, Mx, Right Breast UIQ, Invasive Ductal Carcinoma, ER+ / PR+ / Her2-, Grade 2  The patient will be a good candidate for breast conserving surgery. Depending on the final pathologic results and her willingness to proceed with adjuvant hormonal therapy, she may proceed with adjuvant radiation therapy. We discussed the general course of radiation, potential side effects, and toxicities with radiation if she were to proceed with this approach.  PLAN:  1. Genetics 2. Right lumpectomy 3. Adjuvant radiation therapy TBD 4. Aromatase inhibitor   ------------------------------------------------  Blair Promise, PhD, MD  This document serves as a record  of services personally performed by Gery Pray, MD. It was created on his behalf by Clerance Lav, a trained medical scribe. The creation of this record is based on the scribe's personal observations and the provider's statements to them. This document has been checked and  approved by the attending provider.

## 2020-07-10 NOTE — Progress Notes (Signed)
[place] NOTE  Patient Care Team: Janora Norlander, DO as PCP - General (Family Medicine) Ilean China, RN as Registered Nurse Rockwell Germany, RN as Oncology Nurse Navigator Mauro Kaufmann, RN as Oncology Nurse Navigator Stark Klein, MD as Consulting Physician (General Surgery) Nicholas Lose, MD as Consulting Physician (Hematology and Oncology) Gery Pray, MD as Consulting Physician (Radiation Oncology)  CHIEF COMPLAINTS/PURPOSE OF CONSULTATION:  Newly diagnosed breast cancer  HISTORY OF PRESENTING ILLNESS:  Christine Cantrell 74 y.o. female is here because of recent diagnosis of invasive ductal carcinoma of the right breast. Screening mammogram detected a 0.8cm right breast mass. Diagnostic mammogram on 06/21/20 showed the mass measuring 0.7cm, with no right axillary adenopathy. Biopsy on 07/04/20 showed invasive ductal carcinoma, grade 2, HER-2 equivocal by IHC (2+), ER+ 95%, PR+ 95%, Ki67 30%. She presents to the clinic today for initial evaluation and discussion of treatment options.   I reviewed her records extensively and collaborated the history with the patient.  SUMMARY OF ONCOLOGIC HISTORY: Oncology History  Malignant neoplasm of upper-inner quadrant of right breast in female, estrogen receptor positive ([place])  07/06/2020 Initial Diagnosis   Screening mammogram detected a 0.8cm right breast mass. Diagnostic mammogram showed the mass measuring 0.7cm, with no right axillary adenopathy. 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   07/11/2020 Cancer Staging   Staging form: Breast, AJCC 8th Edition - Clinical stage from 07/11/2020: Stage IA (cT1b, cN0, cM0, G2, ER+, PR+, HER2-) - Signed by Nicholas Lose, MD on 07/11/2020     MEDICAL HISTORY:  Past Medical History:  Diagnosis Date  . Abnormal EKG saw dr hochrein 3 yrs ago for   hx of left bundle branch block on ekg since 2002  . Breast cancer ([place])   .  Depression   . Diverticulosis large intestine w/o perforation or abscess w/bleeding 2016  . GERD (gastroesophageal reflux disease)    hx of  . High cholesterol   . History of hiatal hernia   . Hx of colonic polyps 2016   adenomatous  . Hypertension   . Pancreatic cyst   . Thyroid disease    hypothyroidism    SURGICAL HISTORY: Past Surgical History:  Procedure Laterality Date  . ABDOMINAL HYSTERECTOMY  2002   complete for plyps  . ESOPHAGOGASTRODUODENOSCOPY N/A 09/30/2018   Procedure: ESOPHAGOGASTRODUODENOSCOPY (EGD);  Surgeon: Milus Banister, MD;  Location: [facility];  Service: Endoscopy;  Laterality: N/A;  . EUS N/A 09/30/2018   Procedure: UPPER ENDOSCOPIC ULTRASOUND (EUS) RADIAL;  Surgeon: Milus Banister, MD;  Location: [facility];  Service: Endoscopy;  Laterality: N/A;  . LIVER BIOPSY     normal result  . thryoid needle biopsy  1990's  . THYROIDECTOMY  1982    SOCIAL HISTORY: Social History   Socioeconomic History  . Marital status: Single    Spouse name: Not on file  . Number of children: 0  . Years of education: 16  . Highest education level: Bachelor's degree (e.g., BA, AB, BS)  Occupational History  . Occupation: retired    Comment: Therapist, nutritional  Tobacco Use  . Smoking status: Never Smoker  . Smokeless tobacco: Never Used  Vaping Use  . Vaping Use: Never used  Substance and Sexual Activity  . Alcohol use: Yes    Alcohol/week: 1.0 standard drink    Types: 1 Glasses of wine per week    Comment: occasionally  . Drug use:  Never  . Sexual activity: Not Currently    Partners: Male    Birth control/protection: Surgical  Other Topics Concern  . Not on file  Social History Narrative   Lives alone.  No children   Social Determinants of Health   Financial Resource Strain: Low Risk   . Difficulty of Paying Living Expenses: Not hard at all  Food Insecurity: No Food Insecurity  . Worried About Charity fundraiser in the Last Year: Never true  .  Ran Out of Food in the Last Year: Never true  Transportation Needs: No Transportation Needs  . Lack of Transportation (Medical): No  . Lack of Transportation (Non-Medical): No  Physical Activity: Sufficiently Active  . Days of Exercise per Week: 5 days  . Minutes of Exercise per Session: 40 min  Stress: No Stress Concern Present  . Feeling of Stress : Not at all  Social Connections: Moderately Integrated  . Frequency of Communication with Friends and Family: More than three times a week  . Frequency of Social Gatherings with Friends and Family: More than three times a week  . Attends Religious Services: More than 4 times per year  . Active Member of Clubs or Organizations: Yes  . Attends Archivist Meetings: More than 4 times per year  . Marital Status: Never married  Intimate Partner Violence: Not At Risk  . Fear of Current or Ex-Partner: No  . Emotionally Abused: No  . Physically Abused: No  . Sexually Abused: No    FAMILY HISTORY: Family History  Problem Relation Age of Onset  . High Cholesterol Mother   . Hypertension Mother   . Goiter Mother   . High Cholesterol Father   . Hypertension Father   . Hypertension Brother   . Lung disease Brother        chronic smoker  . Heart disease Brother   . Goiter Sister   . Heart disease Brother   . Hypertension Brother   . Prostate cancer Brother   . Colon cancer Neg Hx   . Stomach cancer Neg Hx   . Pancreatic cancer Neg Hx     ALLERGIES:  has No Known Allergies.  MEDICATIONS:  Current Outpatient Medications  Medication Sig Dispense Refill  . Coenzyme Q10-Vitamin E (QUNOL ULTRA COQ10 PO) Take 1 capsule by mouth daily after lunch.    Javier Docker Oil 500 MG CAPS Take 500 mg by mouth daily after lunch.    . levothyroxine (SYNTHROID) 75 MCG tablet Take 75 mcg by mouth daily before breakfast.    . lisinopril (ZESTRIL) 10 MG tablet Take 1 tablet (10 mg total) by mouth daily. 90 tablet 3  . lisinopril-hydrochlorothiazide  (ZESTORETIC) 20-12.5 MG tablet Take 1 tablet by mouth daily. 90 tablet 3  . Magnesium 250 MG TABS Take 250 mg by mouth daily after lunch.    . Multiple Vitamin (MULTIVITAMIN WITH MINERALS) TABS tablet Take 1 tablet by mouth daily after lunch. Women's One A Day Multivitamin    . Polyethyl Glycol-Propyl Glycol (LUBRICANT EYE DROPS) 0.4-0.3 % SOLN Place 1 drop into both eyes 3 (three) times daily as needed (dry eyes.).    Marland Kitchen simvastatin (ZOCOR) 20 MG tablet Take 1 tablet (20 mg total) by mouth daily. 90 tablet 3   No current facility-administered medications for this visit.    REVIEW OF SYSTEMS:   Constitutional: Denies fevers, chills or abnormal night sweats Eyes: Denies blurriness of vision, double vision or watery eyes Ears, nose, mouth,  throat, and face: Denies mucositis or sore throat Respiratory: Denies cough, dyspnea or wheezes Cardiovascular: Denies palpitation, chest discomfort or lower extremity swelling Gastrointestinal:  Denies nausea, heartburn or change in bowel habits Skin: Denies abnormal skin rashes Lymphatics: Denies new lymphadenopathy or easy bruising Neurological:Denies numbness, tingling or new weaknesses Behavioral/Psych: Mood is stable, no new changes  Breast: Denies any palpable lumps or discharge All other systems were reviewed with the patient and are negative.  PHYSICAL EXAMINATION: ECOG PERFORMANCE STATUS: 1 - Symptomatic but completely ambulatory  Vitals:   07/11/20 0820  BP: (!) 178/82  Pulse: 84  Resp: 17  Temp: 98.7 F (37.1 C)  SpO2: 100%   Filed Weights   07/11/20 0820  Weight: 163 lb 8 oz (74.2 kg)    GENERAL:alert, no distress and comfortable SKIN: skin color, texture, turgor are normal, no rashes or significant lesions EYES: normal, conjunctiva are pink and non-injected, sclera clear OROPHARYNX:no exudate, no erythema and lips, buccal mucosa, and tongue normal  NECK: supple, thyroid normal size, non-tender, without nodularity LYMPH:  no  palpable lymphadenopathy in the cervical, axillary or inguinal LUNGS: clear to auscultation and percussion with normal breathing effort HEART: regular rate & rhythm and no murmurs and no lower extremity edema ABDOMEN:abdomen soft, non-tender and normal bowel sounds Musculoskeletal:no cyanosis of digits and no clubbing  PSYCH: alert & oriented x 3 with fluent speech NEURO: no focal motor/sensory deficits    LABORATORY DATA:  I have reviewed the data as listed Lab Results  Component Value Date   WBC 5.2 07/11/2020   HGB 15.4 (H) 07/11/2020   HCT 43.8 07/11/2020   MCV 94.4 07/11/2020   PLT 146 (L) 07/11/2020   Lab Results  Component Value Date   NA 132 (L) 07/11/2020   K 3.9 07/11/2020   CL 95 (L) 07/11/2020   CO2 25 07/11/2020    RADIOGRAPHIC STUDIES: I have personally reviewed the radiological reports and agreed with the findings in the report.  ASSESSMENT AND PLAN:  Malignant neoplasm of upper-inner quadrant of right breast in female, estrogen receptor positive ([place]) 07/06/2020:Screening mammogram detected a 0.8cm right breast mass. Diagnostic mammogram showed the mass measuring 0.7cm, with no right axillary adenopathy. 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 T1BN0 stage Ia  Pathology and radiology counseling: Discussed with the patient, the details of pathology including the type of breast cancer,the clinical staging, the significance of ER, PR and HER-2/neu receptors and the implications for treatment. After reviewing the pathology in detail, we proceeded to discuss the different treatment options between surgery, radiation, chemotherapy, antiestrogen therapies.  Recommendation: 1.  Breast conserving surgery 2.  +/- Adjuvant radiation therapy 3.  Followed by adjuvant antiestrogen therapy Genetics consultation  Return to clinic after surgery to discuss final pathology report.   All questions were answered. The patient knows to  call the clinic with any problems, questions or concerns.   Rulon Eisenmenger, MD, MPH 07/11/2020    I, Molly Dorshimer, am acting as scribe for Nicholas Lose, MD.  I have reviewed the above documentation for accuracy and completeness, and I agree with the above.

## 2020-07-11 ENCOUNTER — Inpatient Hospital Stay: Payer: Medicare Other

## 2020-07-11 ENCOUNTER — Encounter: Payer: Self-pay | Admitting: Genetic Counselor

## 2020-07-11 ENCOUNTER — Ambulatory Visit (HOSPITAL_BASED_OUTPATIENT_CLINIC_OR_DEPARTMENT_OTHER): Payer: Medicare Other | Admitting: Genetic Counselor

## 2020-07-11 ENCOUNTER — Other Ambulatory Visit: Payer: Self-pay | Admitting: General Surgery

## 2020-07-11 ENCOUNTER — Inpatient Hospital Stay: Payer: Medicare Other | Attending: Hematology and Oncology | Admitting: Hematology and Oncology

## 2020-07-11 ENCOUNTER — Encounter: Payer: Self-pay | Admitting: Licensed Clinical Social Worker

## 2020-07-11 ENCOUNTER — Encounter: Payer: Self-pay | Admitting: *Deleted

## 2020-07-11 ENCOUNTER — Encounter: Payer: Self-pay | Admitting: Hematology and Oncology

## 2020-07-11 ENCOUNTER — Ambulatory Visit
Admission: RE | Admit: 2020-07-11 | Discharge: 2020-07-11 | Disposition: A | Discharge status: Home / Self Care | Payer: Medicare Other | Source: Ambulatory Visit | Attending: Radiation Oncology | Admitting: Radiation Oncology

## 2020-07-11 ENCOUNTER — Other Ambulatory Visit: Payer: Self-pay

## 2020-07-11 DIAGNOSIS — Z170 Estrogen receptor positive status [ER+]: Secondary | ICD-10-CM

## 2020-07-11 DIAGNOSIS — K449 Diaphragmatic hernia without obstruction or gangrene: Secondary | ICD-10-CM | POA: Insufficient documentation

## 2020-07-11 DIAGNOSIS — Z8042 Family history of malignant neoplasm of prostate: Secondary | ICD-10-CM

## 2020-07-11 DIAGNOSIS — E039 Hypothyroidism, unspecified: Secondary | ICD-10-CM | POA: Insufficient documentation

## 2020-07-11 DIAGNOSIS — Z79899 Other long term (current) drug therapy: Secondary | ICD-10-CM | POA: Diagnosis not present

## 2020-07-11 DIAGNOSIS — Z801 Family history of malignant neoplasm of trachea, bronchus and lung: Secondary | ICD-10-CM | POA: Insufficient documentation

## 2020-07-11 DIAGNOSIS — K862 Cyst of pancreas: Secondary | ICD-10-CM | POA: Diagnosis not present

## 2020-07-11 DIAGNOSIS — E7800 Pure hypercholesterolemia, unspecified: Secondary | ICD-10-CM | POA: Insufficient documentation

## 2020-07-11 DIAGNOSIS — C50211 Malignant neoplasm of upper-inner quadrant of right female breast: Secondary | ICD-10-CM

## 2020-07-11 DIAGNOSIS — K219 Gastro-esophageal reflux disease without esophagitis: Secondary | ICD-10-CM | POA: Diagnosis not present

## 2020-07-11 DIAGNOSIS — F329 Major depressive disorder, single episode, unspecified: Secondary | ICD-10-CM | POA: Insufficient documentation

## 2020-07-11 DIAGNOSIS — I10 Essential (primary) hypertension: Secondary | ICD-10-CM | POA: Diagnosis not present

## 2020-07-11 DIAGNOSIS — Z808 Family history of malignant neoplasm of other organs or systems: Secondary | ICD-10-CM | POA: Diagnosis not present

## 2020-07-11 DIAGNOSIS — Z800 Family history of malignant neoplasm of digestive organs: Secondary | ICD-10-CM | POA: Insufficient documentation

## 2020-07-11 DIAGNOSIS — C50212 Malignant neoplasm of upper-inner quadrant of left female breast: Secondary | ICD-10-CM | POA: Diagnosis not present

## 2020-07-11 DIAGNOSIS — Z809 Family history of malignant neoplasm, unspecified: Secondary | ICD-10-CM | POA: Diagnosis not present

## 2020-07-11 DIAGNOSIS — Z86010 Personal history of colonic polyps: Secondary | ICD-10-CM | POA: Diagnosis not present

## 2020-07-11 LAB — CMP (CANCER CENTER ONLY)
ALT: 40 U/L (ref 0–44)
AST: 39 U/L (ref 15–41)
Albumin: 4.2 g/dL (ref 3.5–5.0)
Alkaline Phosphatase: 32 U/L — ABNORMAL LOW (ref 38–126)
Anion gap: 12 (ref 5–15)
BUN: 14 mg/dL (ref 8–23)
CO2: 25 mmol/L (ref 22–32)
Calcium: 9.8 mg/dL (ref 8.9–10.3)
Chloride: 95 mmol/L — ABNORMAL LOW (ref 98–111)
Creatinine: 0.85 mg/dL (ref 0.44–1.00)
GFR, Est AFR Am: >60 mL/min (ref >60)
GFR, Estimated: >60 mL/min (ref >60)
Glucose, Bld: 198 mg/dL — ABNORMAL HIGH (ref 70–99)
Potassium: 3.9 mmol/L (ref 3.5–5.1)
Sodium: 132 mmol/L — ABNORMAL LOW (ref 135–145)
Total Bilirubin: 1.6 mg/dL — ABNORMAL HIGH (ref 0.3–1.2)
Total Protein: 7.7 g/dL (ref 6.5–8.1)

## 2020-07-11 LAB — CBC WITH DIFFERENTIAL (CANCER CENTER ONLY)
Abs Immature Granulocytes: 0.01 10*3/uL (ref 0.00–0.07)
Basophils Absolute: 0.1 10*3/uL (ref 0.0–0.1)
Basophils Relative: 1 %
Eosinophils Absolute: 0.1 10*3/uL (ref 0.0–0.5)
Eosinophils Relative: 2 %
HCT: 43.8 % (ref 36.0–46.0)
Hemoglobin: 15.4 g/dL — ABNORMAL HIGH (ref 12.0–15.0)
Immature Granulocytes: 0 %
Lymphocytes Relative: 10 %
Lymphs Abs: 0.5 10*3/uL — ABNORMAL LOW (ref 0.7–4.0)
MCH: 33.2 pg (ref 26.0–34.0)
MCHC: 35.2 g/dL (ref 30.0–36.0)
MCV: 94.4 fL (ref 80.0–100.0)
Monocytes Absolute: 0.6 10*3/uL (ref 0.1–1.0)
Monocytes Relative: 12 %
Neutro Abs: 3.9 10*3/uL (ref 1.7–7.7)
Neutrophils Relative %: 75 %
Platelet Count: 146 10*3/uL — ABNORMAL LOW (ref 150–400)
RBC: 4.64 MIL/uL (ref 3.87–5.11)
RDW: 11.4 % — ABNORMAL LOW (ref 11.5–15.5)
WBC Count: 5.2 10*3/uL (ref 4.0–10.5)
nRBC: 0 % (ref 0.0–0.2)

## 2020-07-11 LAB — GENETIC SCREENING ORDER

## 2020-07-11 NOTE — Progress Notes (Signed)
Clinical Social Work [place] Psychosocial Distress Screening [place]   Patient completed distress screening protocol and scored a 5 on the Psychosocial Distress Thermometer which indicates moderate distress. Clinical Social Worker met with patient and patient's friend, Velva Harman, in [facility] to assess for distress and other psychosocial needs.  Patient stated she was feeling overwhelmed but felt better after meeting with the treatment team and getting more information on her treatment plan.    CSW and patient discussed common feeling and emotions when being diagnosed with cancer, and the importance of support during treatment.    Barriers to care/review of distress screen:  - Transportation: No problems anticipated. Can drive or ask friend to drive.  - Help at home: lives alone (no significant other, no children). Good friend Velva Harman) lives next door.    - Support system: Support system includes friends who she can talk to or ask for help. Friend/neighbor has also been through breast cancer, so is a good emotional support.  - Finances: No concerns about finances currently. Has insurance. Is a retired Games developer.     Distress Screen: ONCBCN DISTRESS SCREENING 07/11/2020  Screening Type Initial Screening  Distress experienced in past week (1-10) 5  Emotional problem type Depression;Nervousness/Anxiety;Adjusting to illness  Spiritual/Religous concerns type Loss of sense of purpose  Information Concerns Type Lack of info about diagnosis;Lack of info about treatment;Lack of info about complementary therapy choices;Lack of info about maintaining fitness  Physical Problem type Sleep/insomnia    Clinical Social Worker follow up needed: No. CSW informed patient of the support team and support services at [facility].  CSW provided contact information and encouraged patient to call with any questions or concerns.   [place]

## 2020-07-11 NOTE — Assessment & Plan Note (Signed)
07/06/2020:Screening mammogram detected a 0.8cm right breast mass. Diagnostic mammogram showed the mass measuring 0.7cm, with no right axillary adenopathy. 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 T1BN0 stage Ia  Pathology and radiology counseling: Discussed with the patient, the details of pathology including the type of breast cancer,the clinical staging, the significance of ER, PR and HER-2/neu receptors and the implications for treatment. After reviewing the pathology in detail, we proceeded to discuss the different treatment options between surgery, radiation, chemotherapy, antiestrogen therapies.  Recommendation: 1.  Breast conserving surgery 2.  Adjuvant radiation therapy 3.  Followed by adjuvant antiestrogen therapy Genetics consultation  Return to clinic after surgery to discuss final pathology report.

## 2020-07-11 NOTE — Progress Notes (Signed)
REFERRING PROVIDER: Nicholas Lose, MD [place],   [id number]  PRIMARY PROVIDER:  Janora Norlander, DO  PRIMARY REASON FOR VISIT:  1. Malignant neoplasm of upper-inner quadrant of right breast in female, estrogen receptor positive ([place])   2. Family history of prostate cancer   3. Family history of lung cancer   4. Family history of pancreatic cancer      HISTORY OF PRESENT ILLNESS:   Ms. Lahmann, a 74 y.o. female, was seen for a [facility] cancer genetics consultation at the request of Dr. Lindi Adie due to a personal and family history of cancer.  Ms. Peek presents to clinic today to discuss the possibility of a hereditary predisposition to cancer, genetic testing, and to further clarify her future cancer risks, as well as potential cancer risks for family members.   In 2021, at the age of 39, Ms. Colasurdo was diagnosed with invasive ductal carcinoma of the right breast. The treatment plan includes surgery, optional radiation therapy, and antiestrogen therapy.    CANCER HISTORY:  Oncology History  Malignant neoplasm of upper-inner quadrant of right breast in female, estrogen receptor positive ([place])  07/06/2020 Initial Diagnosis   Screening mammogram detected a 0.8cm right breast mass. Diagnostic mammogram showed the mass measuring 0.7cm, with no right axillary adenopathy. 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   07/11/2020 Cancer Staging   Staging form: Breast, AJCC 8th Edition - Clinical stage from 07/11/2020: Stage IA (cT1b, cN0, cM0, G2, ER+, PR+, HER2-) - Signed by Nicholas Lose, MD on 07/11/2020      RISK FACTORS:  Menarche was at age 42.  No live births.  OCP use for approximately 0 years.  Hysterectomy: yes.  Menopausal status: postmenopausal.  HRT use: 0 years. Colonoscopy: yes; 03/28/2015. Mammogram within the last year: yes.   Past Medical History:  Diagnosis Date  . Abnormal EKG saw dr  hochrein 3 yrs ago for   hx of left bundle branch block on ekg since 2002  . Breast cancer ([place])   . Depression   . Diverticulosis large intestine w/o perforation or abscess w/bleeding 2016  . Family history of lung cancer   . Family history of pancreatic cancer   . Family history of prostate cancer   . GERD (gastroesophageal reflux disease)    hx of  . High cholesterol   . History of hiatal hernia   . Hx of colonic polyps 2016   adenomatous  . Hypertension   . Pancreatic cyst   . Thyroid disease    hypothyroidism    Past Surgical History:  Procedure Laterality Date  . ABDOMINAL HYSTERECTOMY  2002   complete for plyps  . ESOPHAGOGASTRODUODENOSCOPY N/A 09/30/2018   Procedure: ESOPHAGOGASTRODUODENOSCOPY (EGD);  Surgeon: Milus Banister, MD;  Location: [facility];  Service: Endoscopy;  Laterality: N/A;  . EUS N/A 09/30/2018   Procedure: UPPER ENDOSCOPIC ULTRASOUND (EUS) RADIAL;  Surgeon: Milus Banister, MD;  Location: [facility];  Service: Endoscopy;  Laterality: N/A;  . LIVER BIOPSY     normal result  . thryoid needle biopsy  1990's  . THYROIDECTOMY  1982    Social History   Socioeconomic History  . Marital status: Single    Spouse name: Not on file  . Number of children: 0  . Years of education: 16  . Highest education level: Bachelor's degree (e.g., BA, AB, BS)  Occupational History  . 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 Smoking status: Never Smoker  . Smokeless tobacco: Never Used  Vaping Use  . Vaping Use: Never used  Substance and Sexual Activity  . Alcohol use: Yes    Alcohol/week: 1.0 standard drink    Types: 1 Glasses of wine per week    Comment: occasionally  . Drug use: Never  . Sexual activity: Not Currently    Partners: Male    Birth control/protection: Surgical  Other Topics Concern  . Not on file  Social History Narrative   Lives alone.  No children   Social Determinants of Health   Financial Resource  Strain: Low Risk   . Difficulty of Paying Living Expenses: Not hard at all  Food Insecurity: No Food Insecurity  . Worried About Charity fundraiser in the Last Year: Never true  . Ran Out of Food in the Last Year: Never true  Transportation Needs: No Transportation Needs  . Lack of Transportation (Medical): No  . Lack of Transportation (Non-Medical): No  Physical Activity: Sufficiently Active  . Days of Exercise per Week: 5 days  . Minutes of Exercise per Session: 40 min  Stress: No Stress Concern Present  . Feeling of Stress : Not at all  Social Connections: Moderately Integrated  . Frequency of Communication with Friends and Family: More than three times a week  . Frequency of Social Gatherings with Friends and Family: More than three times a week  . Attends Religious Services: More than 4 times per year  . Active Member of Clubs or Organizations: Yes  . Attends Archivist Meetings: More than 4 times per year  . Marital Status: Never married     FAMILY HISTORY:  We obtained a detailed, 4-generation family history.  Significant diagnoses are listed below: Family History  Problem Relation Age of Onset  . High Cholesterol Mother   . Hypertension Mother   . Goiter Mother   . High Cholesterol Father   . Hypertension Father   . Hypertension Brother   . Lung disease Brother        chronic smoker  . Heart disease Brother   . Goiter Sister   . Heart disease Brother   . Hypertension Brother   . Prostate cancer Brother 21  . Lung cancer Maternal Uncle        smoker  . Pancreatic cancer Nephew        dx. 40s/50s  . Cancer Nephew        pancreatic vs. multiple myeloma; dx. 40s/50s  . Colon cancer Neg Hx   . Stomach cancer Neg Hx    Ms. Bechtel does not have children. She has two full-brothers, one full-sister, and one maternal half-sister. One brother was diagnosed with prostate cancer at the age of 33. Her half-sister had two sons with cancer - one had pancreatic  cancer diagnosed in his 46s or 65s, and the other had either pancreatic cancer or multiple myeloma diagnosed in his 17s or 42s.   Ms. Lapier mother died at the age of 45 and did not have cancer. Ms. Asche had two maternal uncles and one maternal aunt. One uncle died from lung cancer and had a history of smoking. There is no known cancer among maternal cousins. Her maternal grandparents died in their 38s or 81s and did not have cancer.  Ms. Tripathi father died at the age of 20 and did not have cancer. She had at least two paternal aunts  and one paternal uncle, none of whom had cancer. There is no known cancer among paternal cousins. Her paternal grandparents died in their 15s and did not have cancer.  Ms. Tvedt is unaware of previous family history of genetic testing for hereditary cancer risks. Patient's maternal ancestors are of unknown descent, and paternal ancestors are of [place] descent. There is no reported Ashkenazi Jewish ancestry. There is no known consanguinity.  GENETIC COUNSELING ASSESSMENT: Ms. Beal is a 74 y.o. female with a personal history of breast cancer and a family history of prostate and pancreatic cancer, which is somewhat suggestive of a hereditary cancer syndrome and predisposition to cancer. We, therefore, discussed and recommended the following at today's visit.   DISCUSSION: We discussed that approximately 5-10% of breast cancer is hereditary, with most cases associated with the BRCA1 and BRCA2 genes. There are other genes that can be associated with hereditary breast cancer syndromes. These include ATM, CHEK2, PALB2, etc. We discussed that testing is beneficial for several reasons, including knowing about other cancer risks, identifying potential screening and risk-reduction options that may be appropriate, and to understand if other family members could be at risk for cancer and allow them to undergo genetic testing.   We reviewed the characteristics, features and  inheritance patterns of hereditary cancer syndromes. We also discussed genetic testing, including the appropriate family members to test, the process of testing, insurance coverage and turn-around-time for results. We discussed the implications of a negative, positive and/or variant of uncertain significant result. We recommended Ms. Bertagnolli pursue genetic testing for the [facility] Common Hereditary Cancers panel.   The Common Hereditary Cancers Panel offered by [facility] includes sequencing and/or deletion duplication testing of the following 48 genes: APC, ATM, AXIN2, BARD1, BMPR1A, BRCA1, BRCA2, BRIP1, CDH1, CDK4, CDKN2A (p14ARF), CDKN2A (p16INK4a), CHEK2, CTNNA1, DICER1, EPCAM (Deletion/duplication testing only), GREM1 (promoter region deletion/duplication testing only), KIT, MEN1, MLH1, MSH2, MSH3, MSH6, MUTYH, NBN, NF1, NTHL1, PALB2, PDGFRA, PMS2, POLD1, POLE, PTEN, RAD50, RAD51C, RAD51D, RNF43, SDHB, SDHC, SDHD, SMAD4, SMARCA4. STK11, TP53, TSC1, TSC2, and VHL.  The following genes were evaluated for sequence changes only: SDHA and HOXB13 c.251G>A variant only.   Based on Ms. Confer's personal and family history of cancer, she meets medical criteria for genetic testing. Despite that she meets criteria, she may still have an out of pocket cost. We discussed that if her out of pocket cost for testing is over $100, the laboratory will reach out to let her know. If the out of pocket cost of testing is less than $100 she will be billed by the genetic testing laboratory.   PLAN: After considering the risks, benefits, and limitations, Ms. Magley provided informed consent to pursue genetic testing and the blood sample was sent to [facility] for analysis of the Common Hereditary Cancers Panel. Results should be available within approximately two-three weeks' time, at which point they will be disclosed by telephone to Ms. Basham, as will any additional recommendations warranted by these results. Ms.  Scoggin will receive a summary of her genetic counseling visit and a copy of her results once available. This information will also be available in Epic.   Ms. Bolanos questions were answered to her satisfaction today. Our contact information was provided should additional questions or concerns arise. Thank you for the referral and allowing [place] to share in the care of your patient.   Clint Guy, [place], [facility] Licensed, Certified Dispensing [email or web address] Phone: [phone]  The patient was seen for a total of 25  minutes in face-to-face genetic counseling.  This patient was discussed with Drs. Magrinat, Lindi Adie and/or Burr Medico who agrees with the above.    _______________________________________________________________________ For Office Staff:  Number of people involved in session: 1 Was an Intern/ student involved with case: no

## 2020-07-12 ENCOUNTER — Telehealth: Payer: Self-pay | Admitting: Hematology and Oncology

## 2020-07-12 ENCOUNTER — Telehealth: Payer: Self-pay | Admitting: Genetic Counselor

## 2020-07-12 NOTE — Telephone Encounter (Signed)
Returned Christine Cantrell's call requesting to cancel her genetic testing after discussing the family history with her brother. We will not place the order for genetic testing. We understand this decision and remain available to coordinate genetic testing at any time in the future.

## 2020-07-12 NOTE — Telephone Encounter (Signed)
No 7/14 los, no changes made to pt schedule  

## 2020-07-18 NOTE — Progress Notes (Signed)
Nutrition  Patient identified by attending Breast Clinic on 07/11/2020.  Patient was given nutrition packet with contact information by nurse navigator.   Chart reviewed.   Patient with new diagnoses of breast cancer.  Planning lumpectomy, radiation and antiestrogens.    Ht: 66 inches Wt: 163 lb BMI: 26  Patient currently not at nutritional risk.  RD available if change in patient status occurs.    Nyliah Nierenberg B. Zenia Resides, [place], [place] Registered Dietitian [phone] (mobile)

## 2020-07-19 ENCOUNTER — Telehealth: Payer: Self-pay | Admitting: Hematology and Oncology

## 2020-07-19 ENCOUNTER — Telehealth: Payer: Self-pay | Admitting: *Deleted

## 2020-07-19 ENCOUNTER — Other Ambulatory Visit: Payer: Self-pay | Admitting: Family Medicine

## 2020-07-19 ENCOUNTER — Encounter: Payer: Self-pay | Admitting: *Deleted

## 2020-07-19 DIAGNOSIS — E890 Postprocedural hypothyroidism: Secondary | ICD-10-CM

## 2020-07-19 NOTE — Telephone Encounter (Signed)
Scheduled appointment per 7/21 provider message. Left message on patient's voicemail with appointment date and time.

## 2020-07-19 NOTE — Telephone Encounter (Signed)
Called pt to discuss [place] from 07/11/20. No answer. Mailbox full and unable to leave msg. Will call again.

## 2020-07-20 ENCOUNTER — Encounter: Payer: Self-pay | Admitting: *Deleted

## 2020-07-30 ENCOUNTER — Encounter: Payer: Self-pay | Admitting: *Deleted

## 2020-07-30 ENCOUNTER — Ambulatory Visit: Payer: Medicare Other | Admitting: Hematology and Oncology

## 2020-08-01 ENCOUNTER — Other Ambulatory Visit: Payer: Self-pay | Admitting: Family Medicine

## 2020-08-01 MED ORDER — LEVOTHYROXINE SODIUM 75 MCG PO TABS: 75.0000 ug | ORAL_TABLET | Freq: Every day | ORAL | 0 refills | Status: DC

## 2020-08-14 ENCOUNTER — Ambulatory Visit (INDEPENDENT_AMBULATORY_CARE_PROVIDER_SITE_OTHER): Payer: Medicare Other | Admitting: *Deleted

## 2020-08-14 DIAGNOSIS — E782 Mixed hyperlipidemia: Secondary | ICD-10-CM | POA: Diagnosis not present

## 2020-08-14 DIAGNOSIS — Z170 Estrogen receptor positive status [ER+]: Secondary | ICD-10-CM | POA: Diagnosis not present

## 2020-08-14 DIAGNOSIS — C50211 Malignant neoplasm of upper-inner quadrant of right female breast: Secondary | ICD-10-CM | POA: Diagnosis not present

## 2020-08-14 DIAGNOSIS — I10 Essential (primary) hypertension: Secondary | ICD-10-CM

## 2020-08-14 NOTE — Chronic Care Management (AMB) (Signed)
Chronic Care Management   Follow Up Note   08/14/2020 Name: Christine Cantrell MRN: [id number] DOB: [birth year]  Referred by: Janora Norlander, DO Reason for referral : Chronic Care Management (RN follow up)   Christine Cantrell is a 74 y.o. year old female who is a primary care patient of Janora Norlander, DO. The CCM team was consulted for assistance with chronic disease management and care coordination needs.    Review of patient status, including review of consultants reports, relevant laboratory and other test results, and collaboration with appropriate care team members and the patient's provider was performed as part of comprehensive patient evaluation and provision of chronic care management services.    SDOH (Social Determinants of Health) assessments performed: No See Care Plan activities for detailed interventions related to [facility])     Outpatient Encounter Medications as of 08/14/2020  Medication Sig  . Coenzyme Q10-Vitamin E (QUNOL ULTRA COQ10 PO) Take 1 capsule by mouth daily after lunch.  Javier Docker Oil 500 MG CAPS Take 500 mg by mouth daily after lunch.  . levothyroxine (SYNTHROID) 75 MCG tablet Take 1 tablet (75 mcg total) by mouth daily before breakfast.  . lisinopril (ZESTRIL) 10 MG tablet Take 1 tablet (10 mg total) by mouth daily.  Marland Kitchen lisinopril-hydrochlorothiazide (ZESTORETIC) 20-12.5 MG tablet Take 1 tablet by mouth daily.  . Magnesium 250 MG TABS Take 250 mg by mouth daily after lunch.  . Multiple Vitamin (MULTIVITAMIN WITH MINERALS) TABS tablet Take 1 tablet by mouth daily after lunch. Women's One A Day Multivitamin  . Polyethyl Glycol-Propyl Glycol (LUBRICANT EYE DROPS) 0.4-0.3 % SOLN Place 1 drop into both eyes 3 (three) times daily as needed (dry eyes.).  Marland Kitchen simvastatin (ZOCOR) 20 MG tablet Take 1 tablet (20 mg total) by mouth daily.   No facility-administered encounter medications on file as of 08/14/2020.      RN Care Plan           This Visit's Progress   .  Chronic Disease Management Needs       CARE PLAN ENTRY (see longtitudinal plan of care for additional care plan information)  Current Barriers:  . Chronic Disease Management support, education, and care coordination needs related to HTN, GERD, hypothyroidism, sarcoidosis, HLD, breast cancer  Clinical Goal(s) related to HTN, GERD, hypothyroidism, sarcoidosis, HLD, breast cancer:  Over the next 90 days, patient will:  . Work with the care management team to address educational, disease management, and care coordination needs  . Call provider office for new or worsened signs and symptoms  . Call care management team with questions or concerns . Keep all scheduled medical appointments  Interventions related to HTN, GERD, hypothyroidism, sarcoidosis, HLD, breast cancer:  . Evaluation of current treatment plans and patient's adherence to plan as established by provider . Assessed patient understanding of disease states . Assessed patient's education and care coordination needs . Chart reviewed including recent office notes, lab results, and imaging reports . Reviewed and discussed medications . Discussed newly diagnosed breast cancer and patient's understanding of treatment plan . Per patient, she has no CCM or resource needs at this time but will reach out as needed . Previously provided patient with CCM contact information and encouraged to reach out as needed  Patient Self Care Activities related to HTN, GERD, hypothyroidism, sarcoidosis, HLD, breast Cancer:  . Patient is able to perform ADLs and IADLs independently   Please see past updates related to this goal by clicking on the "Past Updates" button  in the selected goal          Plan:   The care management team will reach out to the patient again over the next 60 days.   Chong Sicilian, BSN, RN-BC Embedded Chronic Care Manager [facility] [place] Management Direct Dial: [phone]

## 2020-08-14 NOTE — Patient Instructions (Signed)
Visit Information  Goals Addressed            This Visit's Progress   . Chronic Disease Management Needs       CARE PLAN ENTRY (see longtitudinal plan of care for additional care plan information)  Current Barriers:  . Chronic Disease Management support, education, and care coordination needs related to HTN, GERD, hypothyroidism, sarcoidosis, HLD, breast cancer  Clinical Goal(s) related to HTN, GERD, hypothyroidism, sarcoidosis, HLD, breast cancer:  Over the next 90 days, patient will:  . Work with the care management team to address educational, disease management, and care coordination needs  . Call provider office for new or worsened signs and symptoms  . Call care management team with questions or concerns . Keep all scheduled medical appointments  Interventions related to HTN, GERD, hypothyroidism, sarcoidosis, HLD, breast cancer:  . Evaluation of current treatment plans and patient's adherence to plan as established by provider . Assessed patient understanding of disease states . Assessed patient's education and care coordination needs . Chart reviewed including recent office notes, lab results, and imaging reports . Reviewed and discussed medications . Discussed newly diagnosed breast cancer and patient's understanding of treatment plan . Per patient, she has no CCM or resource needs at this time but will reach out as needed . Previously provided patient with CCM contact information and encouraged to reach out as needed  Patient Self Care Activities related to HTN, GERD, hypothyroidism, sarcoidosis, HLD, breast Cancer:  . Patient is able to perform ADLs and IADLs independently   Please see past updates related to this goal by clicking on the "Past Updates" button in the selected goal         Patient verbalizes understanding of instructions provided today.   Follow-up Plan The care management team will reach out to the patient again over the next 60 days.   Chong Sicilian, BSN, RN-BC Embedded Chronic Care Manager [facility] / [place] Management Direct Dial: [phone]

## 2020-08-15 NOTE — Progress Notes (Signed)
[id number] [place], [place] - [address] [place] [address] Phone: [phone] Fax: [phone]  [place], [place], [address] [place], [address] [place] [address] Phone: [phone] Fax: [phone]  [facility] [phone] [place], [place] [address] [place] [address] Phone: [phone] Fax: [phone]      Your procedure is scheduled on August 25  Report to [facility] Main Entrance "A" at 0700 A.M., and check in at the Admitting office.  Call this number if you have problems the morning of surgery:  [phone]  Call [phone] if you have any questions prior to your surgery date Monday-Friday 8am-4pm    Remember:  Do not eat after midnight the night before your surgery  You may drink clear liquids until 0600 am the morning of your surgery.   Clear liquids allowed are: Water, Non-Citrus Juices (without pulp), Carbonated Beverages, Clear Tea, Black Coffee Only, and Gatorade  Please complete your PRE-SURGERY ENSURE that was provided to you by 0600 am the morning of surgery.  Please, if able, drink it in one setting. DO NOT SIP.     Take these medicines the morning of surgery with A SIP OF WATER  levothyroxine (SYNTHROID) simvastatin (ZOCOR)  As of today, STOP taking any Aspirin (unless otherwise instructed by your surgeon) Aleve, Naproxen, Ibuprofen, Motrin, Advil, Goody's, BC's, all herbal medications, fish oil, and all vitamins.                      Do not wear jewelry, make up, or nail polish            Do not wear lotions, powders, perfumes/colognes, or deodorant.            Do not shave 48 hours prior to surgery.             Do not bring valuables to the hospital.            [facility] is not responsible for any belongings or valuables.  Do NOT Smoke (Tobacco/Vaping) or drink Alcohol 24 hours prior to  your procedure If you use a CPAP at night, you may bring all equipment for your overnight stay.   Contacts, glasses, dentures or bridgework may not be worn into surgery.      For patients admitted to the hospital, discharge time will be determined by your treatment team.   Patients discharged the day of surgery will not be allowed to drive home, and someone needs to stay with them for 24 hours.    Special instructions:   [facility]- Preparing For Surgery  Before surgery, you can play an important role. Because skin is not sterile, your skin needs to be as free of germs as possible. You can reduce the number of germs on your skin by washing with CHG (chlorahexidine gluconate) Soap before surgery.  CHG is an antiseptic cleaner which kills germs and bonds with the skin to continue killing germs even after washing.    Oral Hygiene is also important to reduce your risk of infection.  Remember - BRUSH YOUR TEETH THE MORNING OF SURGERY WITH YOUR REGULAR TOOTHPASTE  Please do not use if you have an allergy to CHG or antibacterial soaps. If your skin becomes reddened/irritated stop using the CHG.  Do not shave (  including legs and underarms) for at least 48 hours prior to first CHG shower. It is OK to shave your face.  Please follow these instructions carefully.   1. Shower the NIGHT BEFORE SURGERY and the MORNING OF SURGERY with CHG Soap.   2. If you chose to wash your hair, wash your hair first as usual with your normal shampoo.  3. After you shampoo, rinse your hair and body thoroughly to remove the shampoo.  4. Use CHG as you would any other liquid soap. You can apply CHG directly to the skin and wash gently with a scrungie or a clean washcloth.   5. Apply the CHG Soap to your body ONLY FROM THE NECK DOWN.  Do not use on open wounds or open sores. Avoid contact with your eyes, ears, mouth and genitals (private parts). Wash Face and genitals (private parts)  with your normal soap.    6. Wash thoroughly, paying special attention to the area where your surgery will be performed.  7. Thoroughly rinse your body with warm water from the neck down.  8. DO NOT shower/wash with your normal soap after using and rinsing off the CHG Soap.  9. Pat yourself dry with a CLEAN TOWEL.  10. Wear CLEAN PAJAMAS to bed the night before surgery  11. Place CLEAN SHEETS on your bed the night of your first shower and DO NOT SLEEP WITH PETS.   Day of Surgery: Wear Clean/Comfortable clothing the morning of surgery Do not apply any deodorants/lotions.   Remember to brush your teeth WITH YOUR REGULAR TOOTHPASTE.   Please read over the following fact sheets that you were given.

## 2020-08-16 ENCOUNTER — Other Ambulatory Visit: Payer: Self-pay

## 2020-08-16 ENCOUNTER — Encounter (HOSPITAL_COMMUNITY)
Admission: RE | Admit: 2020-08-16 | Discharge: 2020-08-16 | Disposition: A | Discharge status: Home / Self Care | Payer: Medicare Other | Source: Ambulatory Visit | Attending: General Surgery | Admitting: General Surgery

## 2020-08-16 ENCOUNTER — Encounter (HOSPITAL_COMMUNITY): Payer: Self-pay

## 2020-08-16 DIAGNOSIS — Z800 Family history of malignant neoplasm of digestive organs: Secondary | ICD-10-CM | POA: Diagnosis not present

## 2020-08-16 DIAGNOSIS — Z8042 Family history of malignant neoplasm of prostate: Secondary | ICD-10-CM | POA: Insufficient documentation

## 2020-08-16 DIAGNOSIS — Z01818 Encounter for other preprocedural examination: Secondary | ICD-10-CM | POA: Diagnosis not present

## 2020-08-16 DIAGNOSIS — I10 Essential (primary) hypertension: Secondary | ICD-10-CM | POA: Diagnosis not present

## 2020-08-16 DIAGNOSIS — Z801 Family history of malignant neoplasm of trachea, bronchus and lung: Secondary | ICD-10-CM | POA: Insufficient documentation

## 2020-08-16 DIAGNOSIS — Z9079 Acquired absence of other genital organ(s): Secondary | ICD-10-CM | POA: Diagnosis not present

## 2020-08-16 DIAGNOSIS — K862 Cyst of pancreas: Secondary | ICD-10-CM | POA: Insufficient documentation

## 2020-08-16 DIAGNOSIS — C50911 Malignant neoplasm of unspecified site of right female breast: Secondary | ICD-10-CM | POA: Insufficient documentation

## 2020-08-16 DIAGNOSIS — E7800 Pure hypercholesterolemia, unspecified: Secondary | ICD-10-CM | POA: Insufficient documentation

## 2020-08-16 DIAGNOSIS — E890 Postprocedural hypothyroidism: Secondary | ICD-10-CM | POA: Insufficient documentation

## 2020-08-16 DIAGNOSIS — Z79899 Other long term (current) drug therapy: Secondary | ICD-10-CM | POA: Diagnosis not present

## 2020-08-16 DIAGNOSIS — Z79890 Hormone replacement therapy (postmenopausal): Secondary | ICD-10-CM | POA: Insufficient documentation

## 2020-08-16 DIAGNOSIS — I447 Left bundle-branch block, unspecified: Secondary | ICD-10-CM | POA: Diagnosis not present

## 2020-08-16 HISTORY — DX: Hypothyroidism, unspecified: E03.9

## 2020-08-16 LAB — CBC
HCT: 39.4 % (ref 36.0–46.0)
Hemoglobin: 13.6 g/dL (ref 12.0–15.0)
MCH: 32.2 pg (ref 26.0–34.0)
MCHC: 34.5 g/dL (ref 30.0–36.0)
MCV: 93.4 fL (ref 80.0–100.0)
Platelets: 208 10*3/uL (ref 150–400)
RBC: 4.22 MIL/uL (ref 3.87–5.11)
RDW: 11 % — ABNORMAL LOW (ref 11.5–15.5)
WBC: 5.2 10*3/uL (ref 4.0–10.5)
nRBC: 0 % (ref 0.0–0.2)

## 2020-08-16 LAB — BASIC METABOLIC PANEL
Anion gap: 9 (ref 5–15)
BUN: 15 mg/dL (ref 8–23)
CO2: 23 mmol/L (ref 22–32)
Calcium: 9.1 mg/dL (ref 8.9–10.3)
Chloride: 97 mmol/L — ABNORMAL LOW (ref 98–111)
Creatinine, Ser: 0.63 mg/dL (ref 0.44–1.00)
GFR calc Af Amer: >60 mL/min (ref >60)
GFR calc non Af Amer: >60 mL/min (ref >60)
Glucose, Bld: 114 mg/dL — ABNORMAL HIGH (ref 70–99)
Potassium: 4.2 mmol/L (ref 3.5–5.1)
Sodium: 129 mmol/L — ABNORMAL LOW (ref 135–145)

## 2020-08-16 NOTE — Progress Notes (Signed)
Abnormal lab value at preadmission appointment  Na 129  Dr. Barry Dienes notified via in box message.  Chart sent to anesthesia for review.

## 2020-08-16 NOTE — Progress Notes (Signed)
PCP - Ronnie Doss  Chest x-ray - n/a EKG - 08-16-20  ERAS Protcol - yes, Ensure ordered & given  COVID TEST- 08-18-20   Anesthesia review: yes, EKG  Patient denies shortness of breath, fever, cough and chest pain at PAT appointment   All instructions explained to the patient, with a verbal understanding of the material. Patient agrees to go over the instructions while at home for a better understanding. Patient also instructed to self quarantine after being tested for COVID-19. The opportunity to ask questions was provided.

## 2020-08-17 NOTE — Anesthesia Preprocedure Evaluation (Addendum)
Anesthesia Evaluation  Patient identified by MRN, date of birth, ID band Patient awake    Reviewed: Allergy & Precautions, NPO status , Patient's Chart, lab work & pertinent test results  Airway Mallampati: II  TM Distance: >3 FB Neck ROM: Full    Dental no notable dental hx. (+) Teeth Intact, Dental Advisory Given   Pulmonary neg pulmonary ROS,    Pulmonary exam normal breath sounds clear to auscultation       Cardiovascular hypertension, Pt. on medications Normal cardiovascular exam Rhythm:Regular Rate:Normal  LBBB   Neuro/Psych PSYCHIATRIC DISORDERS Depression negative neurological ROS     GI/Hepatic Neg liver ROS, hiatal hernia, GERD  Medicated and Controlled,  Endo/Other  Hypothyroidism   Renal/GU negative Renal ROS  negative genitourinary   Musculoskeletal negative musculoskeletal ROS (+)   Abdominal   Peds  Hematology negative hematology ROS (+)   Anesthesia Other Findings Breast ca   Reproductive/Obstetrics negative OB ROS                          Anesthesia Physical Anesthesia Plan  ASA: II  Anesthesia Plan: General   Post-op Pain Management:    Induction: Intravenous  PONV Risk Score and Plan: 3 and Ondansetron, Dexamethasone and Treatment may vary due to age or medical condition  Airway Management Planned: LMA  Additional Equipment: None  Intra-op Plan:   Post-operative Plan: Extubation in OR  Informed Consent: I have reviewed the patients History and Physical, chart, labs and discussed the procedure including the risks, benefits and alternatives for the proposed anesthesia with the patient or authorized representative who has indicated his/her understanding and acceptance.     Dental advisory given  Plan Discussed with: CRNA  Anesthesia Plan Comments: ( )     Anesthesia Quick Evaluation

## 2020-08-17 NOTE — Progress Notes (Addendum)
Anesthesia Chart Review:  Case: [id number] Date/Time: 08/22/20 0715   Procedure: RIGHT BREAST LUMPECTOMY WITH RADIOACTIVE SEED LOCALIZATION (Right Breast)   Anesthesia type: General   Pre-op diagnosis: RIGHT BREAST CANCER   Location: [facility] ROOM 01 / [facility]   Surgeons: Stark Klein, MD      DISCUSSION: Patient is a 74 year old female scheduled for the above procedure.  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  Na 129. She is on lisinopril-HCTZ. Known chronic LBBB. By notes, remote history of a stress test. Had cardiology evaluation by Dr. Percival Spanish in 2016 with no cardiac testing recommended at that time with as needed follow-up. I attempted to call patient, but voicemail was full. She denied shortness of breath, cough, fever, chest pain at PAT RN visit.   Reviewed available information with anesthesiologist Renold Don, MD including EKG and labs. Na 129--HCTZ may be contributing. Defer decision for repeat labs, if any, to assigned anesthesiologist. (I did not order a repeat sodium given comparison labs suggest chronic mild hyponatremia with Na 132-134 08/2018-06/2020).    Appears RSL is scheduled for 08/21/20. [facility] COVID-19 vaccine 02/10/20.  Presurgical COVID-19 test is scheduled for 08/18/2020. Anesthesia team to evaluate on the day of surgery.(UPDATE 08/20/20 4:22 PM: COVID-19 test negative. Still unable to speak with patient when attempted to call her this afternoon. No CV symptoms reported at PAT and LBBB is known to be chronic.)    VS: BP 104/83   Pulse 88   Temp (!) 36.3 C (Temporal)   Resp 18   Ht 5\' 6"  (1.676 m)   Wt 73.8 kg   SpO2 98%   BMI 26.24 kg/m    PROVIDERS: Janora Norlander, DO his PCP - Nicholas Lose, MD is HEM-ONC - Gery Pray, MD  is RAD-ONC - Renato Shin, MD is endocrinologist. Last encounter 05/31/20 with right thyroid FNA results consistent with benign follicular nodule. [place] Cellar, MD is GI. Last visit 11/15/19 for follow-up pancreatic cyst and abnormal liver imaging. A follow-up MRCP is planned in 6-12 months given precancerous potential of cyst. 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   -She is not routinely followed by cardiology, but had a evaluation 09/19/2015 with Minus Breeding, MD for atypical chest pain with known chronic LBBB. No further cardiovascular testing suggested at that visit with as needed cardiology follow-up. (She moved to [facility] from [facility] in 2016. Reported LBBB present since 1992.)    LABS: Preoperative labs noted. Na 129, previously 132 and 134 within the past two months. Cr 0.63. H/H WNL. A1c 5.4% and TSH 0.434 (L), T4 7.4, Free thyroxine index 2.1, T3 uptake ratio 28 on 06/19/20. PAT RN routed Na result to Dr. Barry Dienes.  (all labs ordered are listed, but only abnormal results are displayed)  Labs Reviewed  BASIC METABOLIC PANEL - Abnormal; Notable for the following components:      Result Value   Sodium 129 (*)    Chloride 97 (*)    Glucose, Bld 114 (*)    All other components within normal limits  CBC - Abnormal; Notable for the following components:   RDW 11.0 (*)    All other components within normal limits     IMAGES: MRI Abd/MRCP 10/06/19: IMPRESSION: 1. Cystic lesion along the posterior margin of the  uncinate process of the pancreas measures 4.2 cubic cm, previously 3.8 cubic cm. This does not qualify as interval growth. This is considered low risk by imaging. Possibilities include a postinflammatory lesion or intraductal papillary mucinous neoplasm. We have now demonstrated 14 months of stability. Follow pancreatic protocol MRI is recommended in 6 months time. This recommendation follows ACR  consensus guidelines: Management of Incidental Pancreatic Cysts: A White Paper of the ACR Incidental Findings Committee. J Am Coll Radiol 1610;96:045-409. 2. The previous region of unusual enhancement in the dome of the liver is no longer seen. It is possible that this was artifactual due to some sort of boundary or ghost artifact on the prior MRI. This is a benign finding and requires no further workup. 3. Mild diffuse hepatic steatosis. 4.  Aortic Atherosclerosis (ICD10-I70.0). 5. Pancreas divisum.   EKG: 08/16/20:  Normal sinus rhythm Left bundle branch block Abnormal ECG Confirmed by Kathlyn Sacramento [phone]) on 08/16/2020 11:06:23 AM - Currently, unable to open 09/07/15 scanned comparison EKG tracing; however, according to cardiologist Dr. Percival Spanish, LBBB old with history of distant stress test. Notes indicate LBBB diagnosed in 1992 or 2002.    CV: Remote history of stress test.    Past Medical History:  Diagnosis Date  . Abnormal EKG saw dr hochrein 3 yrs ago for   hx of left bundle branch block on ekg since 2002  . Breast cancer ([place])   . Depression   . Diverticulosis large intestine w/o perforation or abscess w/bleeding 2016  . Family history of lung cancer   . Family history of pancreatic cancer   . Family history of prostate cancer   . GERD (gastroesophageal reflux disease)    hx of  . High cholesterol   . History of hiatal hernia   . Hx of colonic polyps 2016   adenomatous  . Hypertension   . Hypothyroidism   . Pancreatic cyst   . Thyroid disease    hypothyroidism    Past Surgical History:  Procedure Laterality Date  . ABDOMINAL HYSTERECTOMY  2002   complete for plyps  . ESOPHAGOGASTRODUODENOSCOPY N/A 09/30/2018   Procedure: ESOPHAGOGASTRODUODENOSCOPY (EGD);  Surgeon: Milus Banister, MD;  Location: [facility];  Service: Endoscopy;  Laterality: N/A;  . EUS N/A 09/30/2018   Procedure: UPPER ENDOSCOPIC ULTRASOUND (EUS) RADIAL;  Surgeon: Milus Banister, MD;   Location: [facility];  Service: Endoscopy;  Laterality: N/A;  . LIVER BIOPSY     normal result  . thryoid needle biopsy  1990's  . THYROIDECTOMY  1982    MEDICATIONS: . Coenzyme Q10-Vitamin E (QUNOL ULTRA COQ10 PO)  . Krill Oil 500 MG CAPS  . levothyroxine (SYNTHROID) 75 MCG tablet  . lisinopril (ZESTRIL) 10 MG tablet  . lisinopril-hydrochlorothiazide (ZESTORETIC) 20-12.5 MG tablet  . Magnesium 250 MG TABS  . Multiple Vitamin (MULTIVITAMIN WITH MINERALS) TABS tablet  . Polyethyl Glycol-Propyl Glycol (LUBRICANT EYE DROPS) 0.4-0.3 % SOLN  . simvastatin (ZOCOR) 20 MG tablet   No current facility-administered medications for this encounter.  She is not currently taking coenzyme Q10-Vitamin E, Krill oil, magnesium, MVI.   Myra Gianotti, PA-C Surgical Short Stay/Anesthesiology [facility] Phone [phone] [facility] Phone [phone] 08/17/2020 5:30 PM

## 2020-08-18 ENCOUNTER — Other Ambulatory Visit (HOSPITAL_COMMUNITY)
Admission: RE | Admit: 2020-08-18 | Discharge: 2020-08-18 | Disposition: A | Discharge status: Home / Self Care | Payer: Medicare Other | Source: Ambulatory Visit | Attending: General Surgery | Admitting: General Surgery

## 2020-08-18 DIAGNOSIS — Z20822 Contact with and (suspected) exposure to covid-19: Secondary | ICD-10-CM | POA: Diagnosis not present

## 2020-08-18 DIAGNOSIS — Z01812 Encounter for preprocedural laboratory examination: Secondary | ICD-10-CM | POA: Diagnosis not present

## 2020-08-18 LAB — SARS CORONAVIRUS 2 (TAT 6-24 HRS): SARS Coronavirus 2: NEGATIVE

## 2020-08-20 NOTE — H&P (Signed)
Christine Cantrell Location: [facility] Patient #: [id number] DOB: Sep 12, 1946 Undefined / Language: Christine Cantrell / Race: White Female   History of Present Illness The patient is a 74 year old female who presents with breast cancer. Patient is a lovely 74 yo F who presents wtih screening detected right breast mass 06/2020. She had diagnostic imaging that showed an irregular 7 mm mass at 3 o'clock. Right axillary ultrasound was negative. Core needle biopsy was positive for invasive ductal carcinoma that was ER/PR +, her 2 negative, Ki 67 30%. She has no personal history of cancer other than skin cancer. She has a brother who had prostate cancer.   She had menarche at age 74. She is s/p hysterectomy. She is up to date wtih colonoscopy 5 years ago in 2016. She had a bone density 14 years ago. She is nulliparous and is a retired Games developer. She is here with her neighbor.    Pathology 07/04/2020 Breast, right, needle core biopsy, 3 o'clock, 2cmfn - INVASIVE DUCTAL CARCINOMA Estrogen Receptor: 95%, POSITIVE, STRONG STAINING INTENSITY Progesterone Receptor: 95%, POSITIVE, STRONG STAINING INTENSITY Proliferation Marker Ki67: 30% GROUP 5: HER2 **NEGATIVE**  CBC essentially normal 07/11/2020 CMET wtih Na 132, Cl 95, Gluc 198. T bili 1.6. 07/11/2020    Past Surgical History Breast Biopsy  Right. Hysterectomy (not due to cancer) - Complete  Liver Surgery  Thyroid Surgery   Diagnostic Studies History Colonoscopy  1-5 years ago Mammogram  within last year Pap Smear  never  Medication History  Medications Reconciled  Social History  Alcohol use  Moderate alcohol use. No caffeine use  No drug use  Tobacco use  Never smoker.  Family History  Heart Disease  Father, Mother. Melanoma  Brother. Prostate Cancer  Brother.  Pregnancy / Birth History Age at menarche  24 years. Age of menopause  80-55 Gravida  0  Other Problems High blood pressure   Hypercholesterolemia  Melanoma     Review of Systems General Not Present- Appetite Loss, Chills, Fatigue, Fever, Night Sweats, Weight Gain and Weight Loss. Skin Not Present- Change in Wart/Mole, Dryness, Hives, Jaundice, New Lesions, Non-Healing Wounds, Rash and Ulcer. HEENT Present- Wears glasses/contact lenses. Not Present- Earache, Hearing Loss, Hoarseness, Nose Bleed, Oral Ulcers, Ringing in the Ears, Seasonal Allergies, Sinus Pain, Sore Throat, Visual Disturbances and Yellow Eyes. Respiratory Not Present- Bloody sputum, Chronic Cough, Difficulty Breathing, Snoring and Wheezing. Breast Not Present- Breast Mass, Breast Pain, Nipple Discharge and Skin Changes. Cardiovascular Not Present- Chest Pain, Difficulty Breathing Lying Down, Leg Cramps, Palpitations, Rapid Heart Rate, Shortness of Breath and Swelling of Extremities. Gastrointestinal Not Present- Abdominal Pain, Bloating, Bloody Stool, Change in Bowel Habits, Chronic diarrhea, Constipation, Difficulty Swallowing, Excessive gas, Gets full quickly at meals, Hemorrhoids, Indigestion, Nausea, Rectal Pain and Vomiting. Female Genitourinary Not Present- Frequency, Nocturia, Painful Urination, Pelvic Pain and Urgency. Musculoskeletal Not Present- Back Pain, Joint Pain, Joint Stiffness, Muscle Pain, Muscle Weakness and Swelling of Extremities. Neurological Not Present- Decreased Memory, Fainting, Headaches, Numbness, Seizures, Tingling, Tremor, Trouble walking and Weakness. Psychiatric Not Present- Anxiety, Bipolar, Change in Sleep Pattern, Depression, Fearful and Frequent crying. Endocrine Not Present- Cold Intolerance, Excessive Hunger, Hair Changes, Heat Intolerance, Hot flashes and New Diabetes. Hematology Present- Easy Bruising. Not Present- Blood Thinners, Excessive bleeding, Gland problems, HIV and Persistent Infections.  Vitals  Weight: 163.5 lb Height: 66in Body Surface Area: 1.84 m Body Mass Index: 26.39 kg/m  Temp.:  98.70F  Pulse: 84 (Regular)  Resp.: 17 (Unlabored)  BP: 178/82(Sitting, Left  Arm, Standard)    Physical Exam  General Mental Status-Alert. General Appearance-Consistent with stated age. Hydration-Well hydrated. Voice-Normal.  Head and Neck Head-normocephalic, atraumatic with no lesions or palpable masses. Trachea-midline. Thyroid Gland Characteristics - normal size and consistency.  Eye Eyeball - Bilateral-Extraocular movements intact. Sclera/Conjunctiva - Bilateral-No scleral icterus.  Chest and Lung Exam Chest and lung exam reveals -quiet, even and easy respiratory effort with no use of accessory muscles and on auscultation, normal breath sounds, no adventitious sounds and normal vocal resonance. Inspection Chest Wall - Normal. Back - normal.  Breast Note: no palpable masses. no skin dimpling. Faint bruising medial right breast. No nipple retraction or nipple discharge. No LAD. Left breast benign. Ptosis bilaterally. breasts relatively symmetric.   Cardiovascular Cardiovascular examination reveals -normal heart sounds, regular rate and rhythm with no murmurs and normal pedal pulses bilaterally.  Abdomen Inspection Inspection of the abdomen reveals - No Hernias. Palpation/Percussion Palpation and Percussion of the abdomen reveal - Soft, Non Tender, No Rebound tenderness, No Rigidity (guarding) and No hepatosplenomegaly. Auscultation Auscultation of the abdomen reveals - Bowel sounds normal.  Neurologic Neurologic evaluation reveals -alert and oriented x 3 with no impairment of recent or remote memory. Mental Status-Normal.  Musculoskeletal Global Assessment -Note: no gross deformities.  Normal Exam - Left-Upper Extremity Strength Normal and Lower Extremity Strength Normal. Normal Exam - Right-Upper Extremity Strength Normal and Lower Extremity Strength Normal.  Lymphatic Head & Neck  General Head & Neck Lymphatics:  Bilateral - Description - Normal. Axillary  General Axillary Region: Bilateral - Description - Normal. Tenderness - Non Tender. Femoral & Inguinal  Generalized Femoral & Inguinal Lymphatics: Bilateral - Description - No Generalized lymphadenopathy.    Assessment & Plan MALIGNANT NEOPLASM OF UPPER-INNER QUADRANT OF LEFT BREAST IN FEMALE, ESTROGEN RECEPTOR POSITIVE (C50.212) Impression: Pt has a new diagnosis of hormone positive right breast cancer cT1bN0. We will plan seed localized lumpectomy alone given age and size of tumor. This will be followed by antiestrogen therapy and +/- XRT depending on final pathology.  The surgical procedure was described to the patient. I discussed the incision type and location and that we would need radiology involved on with a wire or seed marker and/or sentinel node.  The risks and benefits of the procedure were described to the patient and she wishes to proceed.  We discussed the risks bleeding, infection, damage to other structures, need for further procedures/surgeries. We discussed the risk of seroma. The patient was advised if the area in the breast in cancer, we may need to go back to surgery for additional tissue to obtain negative margins or for a lymph node biopsy. The patient was advised that these are the most common complications, but that others can occur as well. They were advised against taking aspirin or other anti-inflammatory agents/blood thinners the week before surgery. Current Plans You are being scheduled for surgery- Our schedulers will call you.  You should hear from our office's scheduling department within 5 working days about the location, date, and time of surgery. We try to make accommodations for patient's preferences in scheduling surgery, but sometimes the OR schedule or the surgeon's schedule prevents [place] from making those accommodations.  If you have not heard from our office [phone]) in 5 working days, call the  office and ask for your surgeon's nurse.  If you have other questions about your diagnosis, plan, or surgery, call the office and ask for your surgeon's nurse.  Pt Education - flb breast cancer surgery: discussed  with patient and provided information. FAMILY HISTORY OF CANCER (Z80.9) Impression: Given history of brother with prostate cancer and relative with ovarian cancer, she is referred for genetic counseling.

## 2020-08-21 DIAGNOSIS — C50811 Malignant neoplasm of overlapping sites of right female breast: Secondary | ICD-10-CM | POA: Diagnosis not present

## 2020-08-22 ENCOUNTER — Encounter (HOSPITAL_COMMUNITY): Payer: Self-pay | Admitting: General Surgery

## 2020-08-22 ENCOUNTER — Other Ambulatory Visit: Payer: Self-pay

## 2020-08-22 ENCOUNTER — Ambulatory Visit (HOSPITAL_COMMUNITY): Payer: Medicare Other | Admitting: Vascular Surgery

## 2020-08-22 ENCOUNTER — Ambulatory Visit (HOSPITAL_COMMUNITY): Payer: Medicare Other | Admitting: Anesthesiology

## 2020-08-22 ENCOUNTER — Ambulatory Visit (HOSPITAL_COMMUNITY)
Admission: RE | Admit: 2020-08-22 | Discharge: 2020-08-22 | Disposition: A | Discharge status: Home / Self Care | Payer: Medicare Other | Attending: General Surgery | Admitting: General Surgery

## 2020-08-22 ENCOUNTER — Encounter (HOSPITAL_COMMUNITY)
Admission: RE | Disposition: A | Discharge status: Home / Self Care | Payer: Self-pay | Source: Home / Self Care | Attending: General Surgery

## 2020-08-22 DIAGNOSIS — C50211 Malignant neoplasm of upper-inner quadrant of right female breast: Secondary | ICD-10-CM | POA: Diagnosis not present

## 2020-08-22 DIAGNOSIS — Z170 Estrogen receptor positive status [ER+]: Secondary | ICD-10-CM | POA: Insufficient documentation

## 2020-08-22 DIAGNOSIS — Z8042 Family history of malignant neoplasm of prostate: Secondary | ICD-10-CM | POA: Diagnosis not present

## 2020-08-22 DIAGNOSIS — I10 Essential (primary) hypertension: Secondary | ICD-10-CM | POA: Diagnosis not present

## 2020-08-22 DIAGNOSIS — Z85820 Personal history of malignant melanoma of skin: Secondary | ICD-10-CM | POA: Insufficient documentation

## 2020-08-22 DIAGNOSIS — Z8041 Family history of malignant neoplasm of ovary: Secondary | ICD-10-CM | POA: Diagnosis not present

## 2020-08-22 DIAGNOSIS — E785 Hyperlipidemia, unspecified: Secondary | ICD-10-CM | POA: Diagnosis not present

## 2020-08-22 DIAGNOSIS — C50911 Malignant neoplasm of unspecified site of right female breast: Secondary | ICD-10-CM | POA: Diagnosis not present

## 2020-08-22 HISTORY — PX: BREAST LUMPECTOMY WITH RADIOACTIVE SEED LOCALIZATION: SHX6424

## 2020-08-22 SURGERY — BREAST LUMPECTOMY WITH RADIOACTIVE SEED LOCALIZATION
Anesthesia: General | Site: Breast | Laterality: Right

## 2020-08-22 MED ORDER — BUPIVACAINE HCL (PF) 0.25 % IJ SOLN
INTRAMUSCULAR | Status: AC
  Filled 2020-08-22: qty 30

## 2020-08-22 MED ORDER — ENSURE PRE-SURGERY PO LIQD: 296.0000 mL | Freq: Once | ORAL | Status: DC

## 2020-08-22 MED ORDER — CHLORHEXIDINE GLUCONATE CLOTH 2 % EX PADS: 6.0000 | MEDICATED_PAD | Freq: Once | CUTANEOUS | Status: DC

## 2020-08-22 MED ORDER — LIDOCAINE-EPINEPHRINE 1 %-1:100000 IJ SOLN
INTRAMUSCULAR | Status: AC
  Filled 2020-08-22: qty 1

## 2020-08-22 MED ORDER — FENTANYL CITRATE (PF) 100 MCG/2ML IJ SOLN
INTRAMUSCULAR | Status: DC | PRN
  Administered 2020-08-22 (×3): 25 ug via INTRAVENOUS
  Administered 2020-08-22: 50 ug via INTRAVENOUS

## 2020-08-22 MED ORDER — CEFAZOLIN SODIUM-DEXTROSE 2-4 GM/100ML-% IV SOLN
2.0000 g | INTRAVENOUS | Status: AC
  Administered 2020-08-22: 2 g via INTRAVENOUS
  Filled 2020-08-22: qty 100

## 2020-08-22 MED ORDER — OXYCODONE HCL 5 MG/5ML PO SOLN: 5.0000 mg | Freq: Once | ORAL | Status: DC | PRN

## 2020-08-22 MED ORDER — PHENYLEPHRINE HCL (PRESSORS) 10 MG/ML IV SOLN
INTRAVENOUS | Status: DC | PRN
  Administered 2020-08-22 (×2): 80 ug via INTRAVENOUS

## 2020-08-22 MED ORDER — ORAL CARE MOUTH RINSE: 15.0000 mL | Freq: Once | OROMUCOSAL | Status: AC

## 2020-08-22 MED ORDER — OXYCODONE HCL 5 MG PO TABS: 5.0000 mg | ORAL_TABLET | Freq: Four times a day (QID) | ORAL | 0 refills | Status: DC | PRN

## 2020-08-22 MED ORDER — PROPOFOL 10 MG/ML IV BOLUS
INTRAVENOUS | Status: DC | PRN
  Administered 2020-08-22: 150 mg via INTRAVENOUS
  Administered 2020-08-22: 50 mg via INTRAVENOUS

## 2020-08-22 MED ORDER — ACETAMINOPHEN 500 MG PO TABS
1000.0000 mg | ORAL_TABLET | ORAL | Status: AC
  Administered 2020-08-22: 1000 mg via ORAL
  Filled 2020-08-22: qty 2

## 2020-08-22 MED ORDER — LIDOCAINE 2% (20 MG/ML) 5 ML SYRINGE
INTRAMUSCULAR | Status: DC | PRN
  Administered 2020-08-22: 60 mg via INTRAVENOUS

## 2020-08-22 MED ORDER — BUPIVACAINE HCL (PF) 0.25 % IJ SOLN
INTRAMUSCULAR | Status: DC | PRN
  Administered 2020-08-22: 20 mL

## 2020-08-22 MED ORDER — EPHEDRINE SULFATE-NACL 50-0.9 MG/10ML-% IV SOSY
PREFILLED_SYRINGE | INTRAVENOUS | Status: DC | PRN
  Administered 2020-08-22: 5 mg via INTRAVENOUS

## 2020-08-22 MED ORDER — DEXAMETHASONE SODIUM PHOSPHATE 10 MG/ML IJ SOLN
INTRAMUSCULAR | Status: AC
  Filled 2020-08-22: qty 1

## 2020-08-22 MED ORDER — DEXAMETHASONE SODIUM PHOSPHATE 10 MG/ML IJ SOLN
INTRAMUSCULAR | Status: DC | PRN
  Administered 2020-08-22: 4 mg via INTRAVENOUS

## 2020-08-22 MED ORDER — ONDANSETRON HCL 4 MG/2ML IJ SOLN: 4.0000 mg | Freq: Once | INTRAMUSCULAR | Status: DC | PRN

## 2020-08-22 MED ORDER — HYDROMORPHONE HCL 1 MG/ML IJ SOLN: 0.2500 mg | INTRAMUSCULAR | Status: DC | PRN

## 2020-08-22 MED ORDER — CHLORHEXIDINE GLUCONATE 0.12 % MT SOLN
15.0000 mL | Freq: Once | OROMUCOSAL | Status: AC
  Administered 2020-08-22: 15 mL via OROMUCOSAL
  Filled 2020-08-22: qty 15

## 2020-08-22 MED ORDER — LACTATED RINGERS IV SOLN: INTRAVENOUS | Status: DC

## 2020-08-22 MED ORDER — LIDOCAINE-EPINEPHRINE 1 %-1:100000 IJ SOLN
INTRAMUSCULAR | Status: DC | PRN
  Administered 2020-08-22: 20 mL

## 2020-08-22 MED ORDER — EPHEDRINE 5 MG/ML INJ
INTRAVENOUS | Status: AC
  Filled 2020-08-22: qty 10

## 2020-08-22 MED ORDER — 0.9 % SODIUM CHLORIDE (POUR BTL) OPTIME
TOPICAL | Status: DC | PRN
  Administered 2020-08-22: 1000 mL

## 2020-08-22 MED ORDER — PROPOFOL 10 MG/ML IV BOLUS
INTRAVENOUS | Status: AC
  Filled 2020-08-22: qty 20

## 2020-08-22 MED ORDER — ONDANSETRON HCL 4 MG/2ML IJ SOLN
INTRAMUSCULAR | Status: DC | PRN
  Administered 2020-08-22: 4 mg via INTRAVENOUS

## 2020-08-22 MED ORDER — FENTANYL CITRATE (PF) 250 MCG/5ML IJ SOLN
INTRAMUSCULAR | Status: AC
  Filled 2020-08-22: qty 5

## 2020-08-22 MED ORDER — OXYCODONE HCL 5 MG PO TABS: 5.0000 mg | ORAL_TABLET | Freq: Once | ORAL | Status: DC | PRN

## 2020-08-22 SURGICAL SUPPLY — 40 items
BINDER BREAST XLRG (GAUZE/BANDAGES/DRESSINGS) ×2 IMPLANT
BLADE SURG 10 STRL SS (BLADE) ×2 IMPLANT
CANISTER SUCT 3000ML PPV (MISCELLANEOUS) IMPLANT
CHLORAPREP W/TINT 26 (MISCELLANEOUS) ×2 IMPLANT
CLIP VESOCCLUDE LG 6/CT (CLIP) ×2 IMPLANT
CLIP VESOCCLUDE MED 6/CT (CLIP) ×2 IMPLANT
COVER PROBE W GEL 5X96 (DRAPES) ×2 IMPLANT
COVER SURGICAL LIGHT HANDLE (MISCELLANEOUS) ×2 IMPLANT
COVER WAND RF STERILE (DRAPES) ×2 IMPLANT
DECANTER SPIKE VIAL GLASS SM (MISCELLANEOUS) ×2 IMPLANT
DERMABOND ADVANCED (GAUZE/BANDAGES/DRESSINGS) ×1
DERMABOND ADVANCED .7 DNX12 (GAUZE/BANDAGES/DRESSINGS) ×1 IMPLANT
DEVICE DUBIN SPECIMEN MAMMOGRA (MISCELLANEOUS) ×2 IMPLANT
DRAPE CHEST BREAST 15X10 FENES (DRAPES) ×2 IMPLANT
DRSG PAD ABDOMINAL 8X10 ST (GAUZE/BANDAGES/DRESSINGS) ×2 IMPLANT
ELECT COATED BLADE 2.86 ST (ELECTRODE) ×2 IMPLANT
ELECT REM PT RETURN 9FT ADLT (ELECTROSURGICAL) ×2
ELECTRODE REM PT RTRN 9FT ADLT (ELECTROSURGICAL) ×1 IMPLANT
GAUZE SPONGE 4X4 12PLY STRL LF (GAUZE/BANDAGES/DRESSINGS) ×2 IMPLANT
GLOVE BIO SURGEON STRL SZ 6 (GLOVE) ×2 IMPLANT
GLOVE INDICATOR 6.5 STRL GRN (GLOVE) ×2 IMPLANT
GOWN STRL REUS W/ TWL LRG LVL3 (GOWN DISPOSABLE) ×1 IMPLANT
GOWN STRL REUS W/TWL 2XL LVL3 (GOWN DISPOSABLE) ×2 IMPLANT
GOWN STRL REUS W/TWL LRG LVL3 (GOWN DISPOSABLE) ×1
KIT BASIN OR (CUSTOM PROCEDURE TRAY) ×2 IMPLANT
KIT MARKER MARGIN INK (KITS) ×2 IMPLANT
LIGHT WAVEGUIDE WIDE FLAT (MISCELLANEOUS) ×2 IMPLANT
NEEDLE HYPO 25GX1X1/2 BEV (NEEDLE) ×2 IMPLANT
NS IRRIG 1000ML POUR BTL (IV SOLUTION) IMPLANT
PACK GENERAL/GYN (CUSTOM PROCEDURE TRAY) ×2 IMPLANT
STRIP CLOSURE SKIN 1/2X4 (GAUZE/BANDAGES/DRESSINGS) ×2 IMPLANT
SUT MNCRL AB 4-0 PS2 18 (SUTURE) ×2 IMPLANT
SUT SILK 2 0 SH (SUTURE) IMPLANT
SUT VIC AB 2-0 SH 27 (SUTURE) ×2
SUT VIC AB 2-0 SH 27XBRD (SUTURE) ×2 IMPLANT
SUT VIC AB 3-0 SH 27 (SUTURE) ×1
SUT VIC AB 3-0 SH 27X BRD (SUTURE) ×1 IMPLANT
SYR CONTROL 10ML LL (SYRINGE) ×2 IMPLANT
TOWEL GREEN STERILE (TOWEL DISPOSABLE) ×2 IMPLANT
TOWEL GREEN STERILE FF (TOWEL DISPOSABLE) ×2 IMPLANT

## 2020-08-22 NOTE — Transfer of Care (Signed)
Immediate Anesthesia Transfer of Care Note  Patient: Christine Cantrell  Procedure(s) Performed: RIGHT BREAST LUMPECTOMY WITH RADIOACTIVE SEED LOCALIZATION (Right Breast)  Patient Location: PACU  Anesthesia Type:General  Level of Consciousness: awake, alert  and oriented  Airway & Oxygen Therapy: Patient Spontanous Breathing and Patient connected to face mask oxygen  Post-op Assessment: Report given to RN and Post -op Vital signs reviewed and stable  Post vital signs: Reviewed and stable  Last Vitals:  Vitals Value Taken Time  BP    Temp    Pulse    Resp    SpO2      Last Pain:  Vitals:   08/22/20 0605  TempSrc:   PainSc: 0-No pain      Patients Stated Pain Goal: 3 (11/88/67 7373)  Complications: No complications documented.

## 2020-08-22 NOTE — Anesthesia Postprocedure Evaluation (Signed)
Anesthesia Post Note  Patient: Christine Cantrell  Procedure(s) Performed: RIGHT BREAST LUMPECTOMY WITH RADIOACTIVE SEED LOCALIZATION (Right Breast)     Patient location during evaluation: PACU Anesthesia Type: General Level of consciousness: awake and alert, oriented and patient cooperative Pain management: pain level controlled Vital Signs Assessment: post-procedure vital signs reviewed and stable Respiratory status: spontaneous breathing, nonlabored ventilation and respiratory function stable Cardiovascular status: blood pressure returned to baseline and stable Postop Assessment: no apparent nausea or vomiting Anesthetic complications: no   No complications documented.  Last Vitals:  Vitals:   08/22/20 0900 08/22/20 0935  BP: 128/64   Pulse: 75   Resp: 16   Temp: 36.4 C 36.4 C  SpO2: 100%     Last Pain:  Vitals:   08/22/20 0935  TempSrc:   PainSc: 0-No pain                 Pervis Hocking

## 2020-08-22 NOTE — Discharge Instructions (Signed)
[facility] °Office Phone Number [phone] ° °BREAST BIOPSY/ PARTIAL MASTECTOMY: POST OP INSTRUCTIONS ° °Always review your discharge instruction sheet given to you by the facility where your surgery was performed. ° °IF YOU HAVE DISABILITY OR FAMILY LEAVE FORMS, YOU MUST BRING THEM TO THE OFFICE FOR PROCESSING.  DO NOT GIVE THEM TO YOUR DOCTOR. ° °1. A prescription for pain medication may be given to you upon discharge.  Take your pain medication as prescribed, if needed.  If narcotic pain medicine is not needed, then you may take acetaminophen (Tylenol) or ibuprofen (Advil) as needed. °2. Take your usually prescribed medications unless otherwise directed °3. If you need a refill on your pain medication, please contact your pharmacy.  They will contact our office to request authorization.  Prescriptions will not be filled after 5pm or on week-ends. °4. You should eat very light the first 24 hours after surgery, such as soup, crackers, pudding, etc.  Resume your normal diet the day after surgery. °5. Most patients will experience some swelling and bruising in the breast.  Ice packs and a good support bra will help.  Swelling and bruising can take several days to resolve.  °6. It is common to experience some constipation if taking pain medication after surgery.  Increasing fluid intake and taking a stool softener will usually help or prevent this problem from occurring.  A mild laxative (Milk of Magnesia or Miralax) should be taken according to package directions if there are no bowel movements after 48 hours. °7. Unless discharge instructions indicate otherwise, you may remove your bandages 48 hours after surgery, and you may shower at that time.  You may have steri-strips (small skin tapes) in place directly over the incision.  These strips should be left on the skin for 7-10 days.   Any sutures or staples will be removed at the office during your follow-up visit. °8. ACTIVITIES:  You may resume  regular daily activities (gradually increasing) beginning the next day.  Wearing a good support bra or sports bra (or the breast binder) minimizes pain and swelling.  You may have sexual intercourse when it is comfortable. °a. You may drive when you no longer are taking prescription pain medication, you can comfortably wear a seatbelt, and you can safely maneuver your car and apply brakes. °b. RETURN TO WORK:  __________1 week_______________ °9. You should see your doctor in the office for a follow-up appointment approximately two weeks after your surgery.  Your doctor’s nurse will typically make your follow-up appointment when she calls you with your pathology report.  Expect your pathology report 2-3 business days after your surgery.  You may call to check if you do not hear from us after three days. ° ° °WHEN TO CALL YOUR DOCTOR: °1. Fever over 101.0 °2. Nausea and/or vomiting. °3. Extreme swelling or bruising. °4. Continued bleeding from incision. °5. Increased pain, redness, or drainage from the incision. ° °The clinic staff is available to answer your questions during regular business hours.  Please don’t hesitate to call and ask to speak to one of the nurses for clinical concerns.  If you have a medical emergency, go to the nearest emergency room or call 911.  A surgeon from [facility] is always on call at the hospital. ° °For further questions, please visit [email or web address]  ° °

## 2020-08-22 NOTE — Op Note (Signed)
Right Breast Radioactive seed localized lumpectomy  Indications: This patient presents with history of right breast cancer, cT1bN0M0, grade 2 invasive ductal carcinoma, upper inner quadrant, receptors +/+/-, due to age and small tumor size, SLN bx not performed.    Pre-operative Diagnosis: right breast cancer  Post-operative Diagnosis: right  Surgeon: Stark Klein   Anesthesia: General endotracheal anesthesia  ASA Class: 2  Procedure Details  The patient was seen in the Holding Room. The risks, benefits, complications, treatment options, and expected outcomes were discussed with the patient. The possibilities of bleeding, infection, the need for additional procedures, failure to diagnose a condition, and creating a complication requiring other procedures or operations were discussed with the patient. The patient concurred with the proposed plan, giving informed consent.  The site of surgery properly noted/marked. The patient was taken to Operating Room # 1, identified, and the procedure verified as right breast seed localized lumpectomy.  The right breast and chest were prepped and draped in standard fashion. A medial circumareolar incision was made near the previously placed radioactive seed.  Dissection was carried down around the point of maximum signal intensity. The cautery was used to perform the dissection.   The specimen was inked with the margin marker paint kit.    Specimen radiography confirmed inclusion of the mammographic lesion, the clip, and the seed, but the medial aspect was close to the seed.  Additional margins were taken superiorly, medially, posteriorly and inferiorly.  The background signal in the breast was zero.  Hemostasis was achieved with cautery.  The cavity was marked with clips on each border other than the anterior border.  The wound was irrigated and closed with 3-0 vicryl interrupted deep dermal sutures and 4-0 monocryl running subcuticular suture.      Sterile  dressings were applied. At the end of the operation, all sponge, instrument, and needle counts were correct.   Findings: Seed, clip in specimen.  Anterior margin skin and posterior margin is pectoralis.   Estimated Blood Loss:  min         Specimens: right breast tissue with seed, additional medial margin, additional posterior margin, additional superior margin, and additional inferior margin.          Complications:  None; patient tolerated the procedure well.         Disposition: PACU - hemodynamically stable.         Condition: stable

## 2020-08-22 NOTE — Anesthesia Procedure Notes (Signed)
Procedure Name: LMA Insertion Date/Time: 08/22/2020 7:46 AM Performed by: Babs Bertin, CRNA Pre-anesthesia Checklist: Patient identified, Emergency Drugs available, Suction available and Patient being monitored Patient Re-evaluated:Patient Re-evaluated prior to induction Oxygen Delivery Method: Circle System Utilized Preoxygenation: Pre-oxygenation with 100% oxygen Induction Type: IV induction Ventilation: Mask ventilation without difficulty LMA: LMA inserted LMA Size: 4.0 Number of attempts: 1 Airway Equipment and Method: Bite block Placement Confirmation: positive ETCO2 Tube secured with: Tape Dental Injury: Teeth and Oropharynx as per pre-operative assessment

## 2020-08-22 NOTE — Interval H&P Note (Signed)
History and Physical Interval Note:  08/22/2020 7:28 AM  Christine Cantrell  has presented today for surgery, with the diagnosis of RIGHT BREAST CANCER.  The various methods of treatment have been discussed with the patient and family. After consideration of risks, benefits and other options for treatment, the patient has consented to  Procedure(s): RIGHT BREAST LUMPECTOMY WITH RADIOACTIVE SEED LOCALIZATION (Right) as a surgical intervention.  The patient's history has been reviewed, patient examined, no change in status, stable for surgery.  I have reviewed the patient's chart and labs.  Questions were answered to the patient's satisfaction.     Stark Klein

## 2020-08-23 ENCOUNTER — Other Ambulatory Visit: Payer: Self-pay | Admitting: Family Medicine

## 2020-08-23 ENCOUNTER — Telehealth: Payer: Self-pay | Admitting: Family Medicine

## 2020-08-23 ENCOUNTER — Encounter (HOSPITAL_COMMUNITY): Payer: Self-pay | Admitting: General Surgery

## 2020-08-23 LAB — SURGICAL PATHOLOGY

## 2020-08-23 NOTE — Telephone Encounter (Signed)
Patient aware and verbalized understanding. °

## 2020-08-23 NOTE — Telephone Encounter (Signed)
I'd probably give her body at least a week or 2 to heal before getting the booster

## 2020-08-23 NOTE — Telephone Encounter (Signed)
Pt said she had surgery yesterday and was talking to her pharmacist today about getting the booster shot for covid vaccine and was told that pt needed to contact her PCP and make sure it was ok for her to get the booster shot or wait, since she just had surgery yesterday.

## 2020-08-29 NOTE — Progress Notes (Signed)
Patient Care Team: Janora Norlander, DO as PCP - General (Family Medicine) Ilean China, RN as [place] [facility] Carlynn Spry, Charlott Holler, RN as Oncology Nurse Navigator Tressie Ellis, Paulette Blanch, RN as Oncology Nurse Navigator Stark Klein, MD as Consulting Physician (General Surgery) Nicholas Lose, MD as Consulting Physician (Hematology and Oncology) Gery Pray, MD as Consulting Physician (Radiation Oncology)  DIAGNOSIS:    ICD-10-CM   1. Malignant neoplasm of upper-inner quadrant of right breast in female, estrogen receptor positive ([place])  C50.211    Z17.0     SUMMARY OF ONCOLOGIC HISTORY: Oncology History  Malignant neoplasm of upper-inner quadrant of right breast in female, estrogen receptor positive ([place])  07/06/2020 Initial Diagnosis   Screening mammogram detected a 0.8cm right breast mass. Diagnostic mammogram showed the mass measuring 0.7cm, with no right axillary adenopathy. 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   07/11/2020 Cancer Staging   Staging form: Breast, AJCC 8th Edition - Clinical stage from 07/11/2020: Stage IA (cT1b, cN0, cM0, G2, ER+, PR+, HER2-) - Signed by Nicholas Lose, MD on 07/11/2020   08/22/2020 Surgery   Right lumpectomy [facility]): IDC, 1.1cm, grade 2, clear margins.     CHIEF COMPLIANT: Follow-up s/p lumpectomy to review pathology   INTERVAL HISTORY: Christine Cantrell is a 74 y.o. with above-mentioned history of right breast cancer. She underwent a right lumpectomy on 08/22/20 with Dr. Barry Dienes for which pathology showed invasive ductal carcinoma, 1.1cm, grade 2, clear margins. She presents to the clinic today to discuss the pathology report and further treatment.   ALLERGIES:  has No Known Allergies.  MEDICATIONS:  Current Outpatient Medications  Medication Sig Dispense Refill  . Coenzyme Q10-Vitamin E (QUNOL ULTRA COQ10 PO) Take 1 capsule by mouth daily after lunch.     Javier Docker Oil 500 MG  CAPS Take 500 mg by mouth daily after lunch.     . lisinopril (ZESTRIL) 10 MG tablet Take 1 tablet (10 mg total) by mouth daily. 90 tablet 3  . lisinopril-hydrochlorothiazide (ZESTORETIC) 20-12.5 MG tablet Take 1 tablet by mouth daily. 90 tablet 3  . Magnesium 250 MG TABS Take 250 mg by mouth daily after lunch.     . Multiple Vitamin (MULTIVITAMIN WITH MINERALS) TABS tablet Take 1 tablet by mouth daily after lunch. Women's One A Day Multivitamin     . oxyCODONE (OXY IR/ROXICODONE) 5 MG immediate release tablet Take 1 tablet (5 mg total) by mouth every 6 (six) hours as needed for severe pain. 15 tablet 0  . Polyethyl Glycol-Propyl Glycol (LUBRICANT EYE DROPS) 0.4-0.3 % SOLN Place 1 drop into both eyes 3 (three) times daily as needed (dry eyes.).    Marland Kitchen simvastatin (ZOCOR) 20 MG tablet Take 1 tablet (20 mg total) by mouth daily. 90 tablet 3  . SYNTHROID 75 MCG tablet TAKE 1 TABLET (75 MCG TOTAL) BY MOUTH DAILY BEFORE BREAKFAST. 30 tablet 3   No current facility-administered medications for this visit.    PHYSICAL EXAMINATION: ECOG PERFORMANCE STATUS: 1 - Symptomatic but completely ambulatory  Vitals:   08/30/20 1446  BP: (!) 155/71  Pulse: (!) 118  Resp: 18  Temp: 99.1 F (37.3 C)  SpO2: 100%   Filed Weights   08/30/20 1446  Weight: 162 lb 8 oz (73.7 kg)    LABORATORY DATA:  I have reviewed the data as listed CMP Latest Ref Rng & Units 08/16/2020 07/11/2020 06/19/2020  Glucose 70 - 99 mg/dL  114(H) 198(H) 129(H)  BUN 8 - 23 mg/dL 15 14 9   Creatinine 0.44 - 1.00 mg/dL 0.63 0.85 0.68  Sodium 135 - 145 mmol/L 129(L) 132(L) 134  Potassium 3.5 - 5.1 mmol/L 4.2 3.9 4.5  Chloride 98 - 111 mmol/L 97(L) 95(L) 97  CO2 22 - 32 mmol/L 23 25 22   Calcium 8.9 - 10.3 mg/dL 9.1 9.8 9.8  Total Protein 6.5 - 8.1 g/dL - 7.7 6.8  Total Bilirubin 0.3 - 1.2 mg/dL - 1.6(H) 0.6  Alkaline Phos 38 - 126 U/L - 32(L) 36(L)  AST 15 - 41 U/L - 39 38  ALT 0 - 44 U/L - 40 33(H)    Lab Results  Component  Value Date   WBC 5.2 08/16/2020   HGB 13.6 08/16/2020   HCT 39.4 08/16/2020   MCV 93.4 08/16/2020   PLT 208 08/16/2020   NEUTROABS 3.9 07/11/2020    ASSESSMENT & PLAN:  Malignant neoplasm of upper-inner quadrant of right breast in female, estrogen receptor positive ([place]) 07/06/2020:Screening mammogram detected a 0.8cm right breast mass. Diagnostic mammogram showed the mass measuring 0.7cm, with no right axillary adenopathy. 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 T1BN0 stage Ia  08/22/2020:Right lumpectomy ([facility]): IDC, 1.1cm, grade 2, clear margins.  ER 95%, PR 95%, Ki-67 30%, IHC equivocal, FISH negative  Treatment plan: adjuvant antiestrogen therapy with anastrozole 1 mg daily x5 years to start 09/04/2020 Anastrozole counseling:We discussed the risks and benefits of anti-estrogen therapy with aromatase inhibitors. These include but not limited to insomnia, hot flashes, mood changes, vaginal dryness, bone density loss, and weight gain. We strongly believe that the benefits far outweigh the risks. Patient understands these risks and consented to starting treatment. Planned treatment duration is 5 years.  We will discuss her case in tumor board regarding adjuvant radiation.  Patient is of the opinion that she does not need radiation.  I also agree with her that given her favorable prognostic features it is okay to skip radiation.    No orders of the defined types were placed in this encounter.  The patient has a good understanding of the overall plan. she agrees with it. she will call with any problems that may develop before the next visit here.  Total time spent: 30 mins including face to face time and time spent for planning, charting and coordination of care  Nicholas Lose, MD 08/30/2020  I, Cloyde Reams Dorshimer, am acting as scribe for Dr. Nicholas Lose.  I have reviewed the above documentation for accuracy and completeness, and I agree with  the above.

## 2020-08-30 ENCOUNTER — Inpatient Hospital Stay: Payer: Medicare Other | Attending: Hematology and Oncology | Admitting: Hematology and Oncology

## 2020-08-30 ENCOUNTER — Encounter: Payer: Self-pay | Admitting: *Deleted

## 2020-08-30 ENCOUNTER — Other Ambulatory Visit: Payer: Self-pay

## 2020-08-30 VITALS — BP 155/71 | HR 118 | Temp 99.1°F | Resp 18 | Ht 66.0 in | Wt 162.5 lb

## 2020-08-30 DIAGNOSIS — C50211 Malignant neoplasm of upper-inner quadrant of right female breast: Secondary | ICD-10-CM

## 2020-08-30 DIAGNOSIS — Z780 Asymptomatic menopausal state: Secondary | ICD-10-CM

## 2020-08-30 DIAGNOSIS — Z170 Estrogen receptor positive status [ER+]: Secondary | ICD-10-CM | POA: Diagnosis not present

## 2020-08-30 DIAGNOSIS — Z79899 Other long term (current) drug therapy: Secondary | ICD-10-CM | POA: Diagnosis not present

## 2020-08-30 MED ORDER — ANASTROZOLE 1 MG PO TABS: 1.0000 mg | ORAL_TABLET | Freq: Every day | ORAL | 3 refills | Status: DC

## 2020-08-30 NOTE — Assessment & Plan Note (Signed)
07/06/2020:Screening mammogram detected a 0.8cm right breast mass. Diagnostic mammogram showed the mass measuring 0.7cm, with no right axillary adenopathy. 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 T1BN0 stage Ia  08/22/2020:Right lumpectomy ([facility]): IDC, 1.1cm, grade 2, clear margins.  ER 95%, PR 95%, Ki-67 30%, IHC equivocal, FISH negative  Treatment plan: 1.  +/- Adjuvant radiation therapy 2.  Followed by adjuvant antiestrogen therapy

## 2020-08-31 ENCOUNTER — Other Ambulatory Visit: Payer: Self-pay | Admitting: Hematology and Oncology

## 2020-08-31 DIAGNOSIS — M810 Age-related osteoporosis without current pathological fracture: Secondary | ICD-10-CM

## 2020-09-04 ENCOUNTER — Telehealth: Payer: Self-pay | Admitting: Hematology and Oncology

## 2020-09-04 NOTE — Telephone Encounter (Signed)
Scheduled per 9/2 los. Called pt no answer and was unable to leave a msg. Mailing appt letter and calendar printout

## 2020-09-05 ENCOUNTER — Other Ambulatory Visit: Payer: Self-pay

## 2020-09-05 ENCOUNTER — Ambulatory Visit
Admission: RE | Admit: 2020-09-05 | Discharge: 2020-09-05 | Disposition: A | Discharge status: Home / Self Care | Payer: Medicare Other | Source: Ambulatory Visit | Attending: Hematology and Oncology | Admitting: Hematology and Oncology

## 2020-09-05 ENCOUNTER — Telehealth: Payer: Self-pay | Admitting: *Deleted

## 2020-09-05 DIAGNOSIS — M810 Age-related osteoporosis without current pathological fracture: Secondary | ICD-10-CM

## 2020-09-05 DIAGNOSIS — Z780 Asymptomatic menopausal state: Secondary | ICD-10-CM | POA: Diagnosis not present

## 2020-09-05 NOTE — Telephone Encounter (Signed)
Called pt with TB recommendations of no xrt and to take AI as prescribed by Dr. Lindi Adie. Received verbal understanding.

## 2020-09-10 ENCOUNTER — Other Ambulatory Visit: Payer: Self-pay | Admitting: Family Medicine

## 2020-09-10 DIAGNOSIS — E890 Postprocedural hypothyroidism: Secondary | ICD-10-CM

## 2020-09-11 IMAGING — MR MR ABDOMEN WO/W CM MRCP
10 of 19 series · 18 of 48 positions shown · IV contrast (gadavist)
Comparison: CT scan 09/13/2018

CLINICAL DATA: Pancreatic cystic lesion for further
characterization.

EXAM:
MRI ABDOMEN WITHOUT AND WITH CONTRAST (INCLUDING MRCP)
TECHNIQUE: Multiplanar multisequence MR imaging of the abdomen was performed
both before and after the administration of intravenous contrast.
Heavily T2-weighted images of the biliary and pancreatic ducts were
obtained, and three-dimensional MRCP images were rendered by post
processing.
CONTRAST:  8mL GADAVIST GADOBUTROL 1 MMOL/ML IV SOLN

[Series 3: T2 fat-sat · axial · 5.0mm · 0.86mm/px · 1 of 59 slices shown]
[im 1/59]
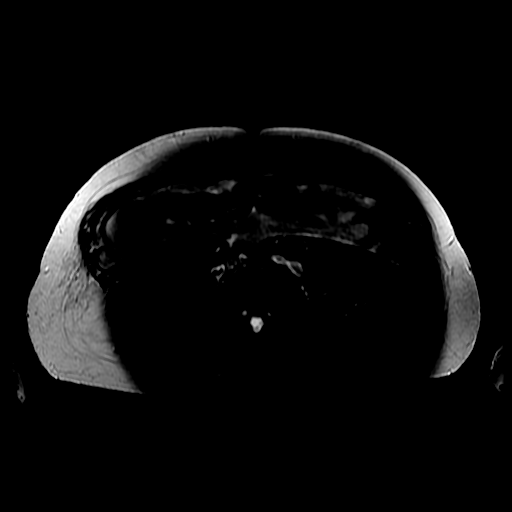

[Series 5: MRCP · coronal · 2.0mm · 0.70mm/px · 1 of 55 slices shown (1 of 2)]
[im 1/55]
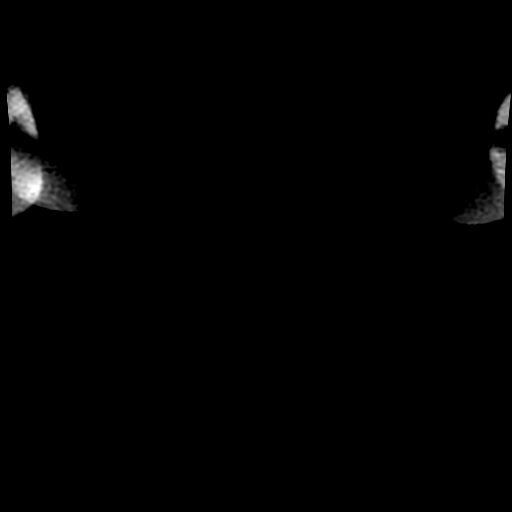

[Series 6: DWI b500 · axial · 6.0mm · 1.76mm/px · 1 of 70 slices shown]
[im 1/70]
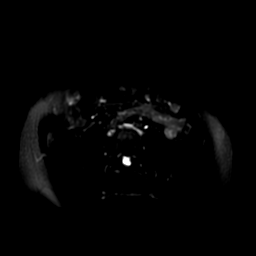

[Series 7: T2 · coronal · 5.0mm · 0.78mm/px · 1 of 44 slices shown (1 of 2)]
[im 1/44]
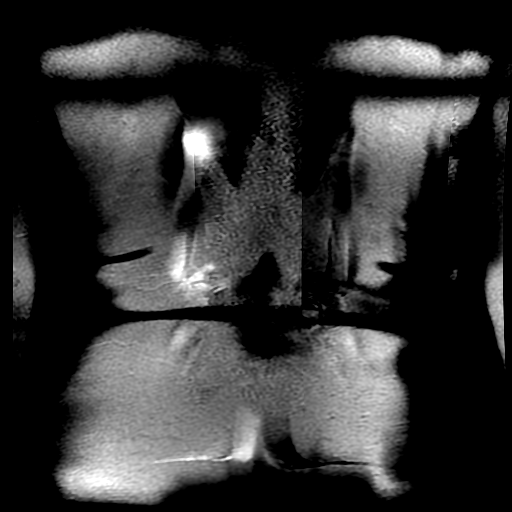

[Series 8: T2 · axial · 5.0mm · 0.86mm/px · 1 of 54 slices shown (2 of 2)]
[im 1/54]
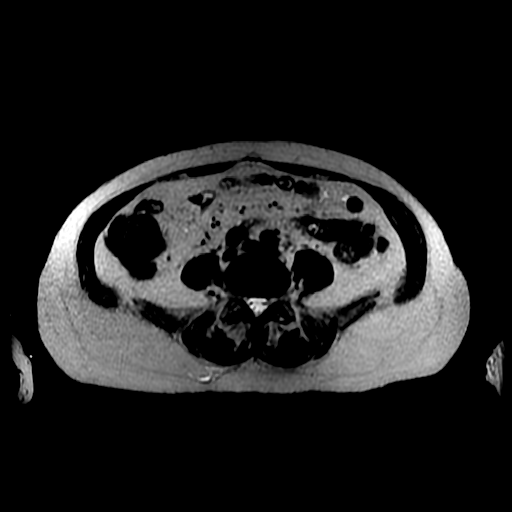

[Series 9: ax dualecho bh · axial · 5.0mm · 0.86mm/px · z∈[-72,+193]mm · 3 of 108 slices shown]
[im 1/108]
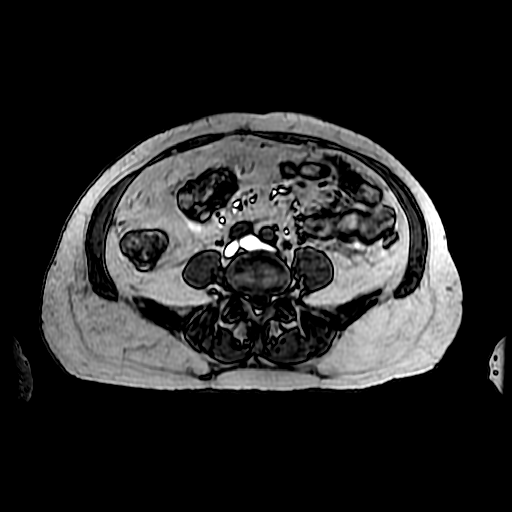
[im 54/108]
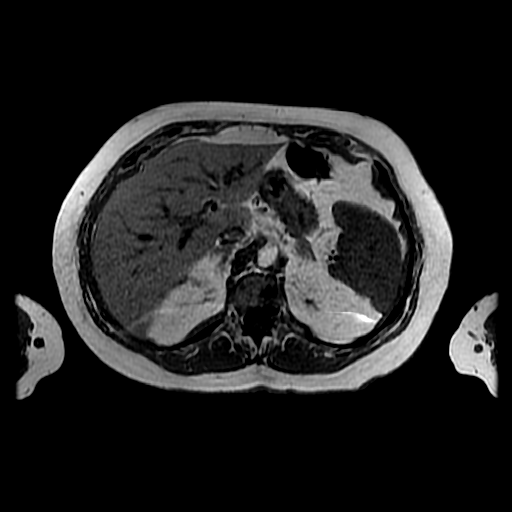
[im 108/108]
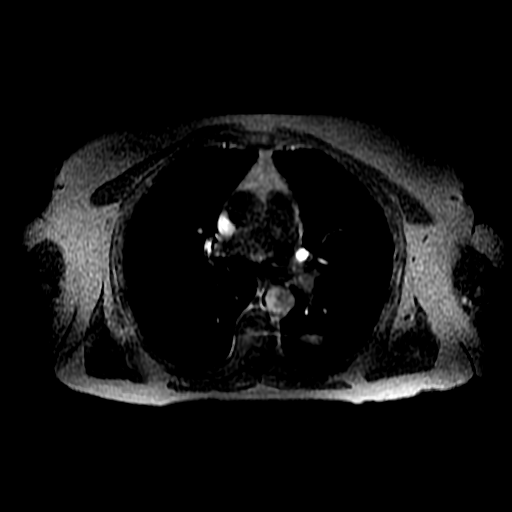

[Series 10: MRCP · coronal · 40.0mm · 0.70mm/px · 1 of 7 slices shown (2 of 2)]
[im 1/7]
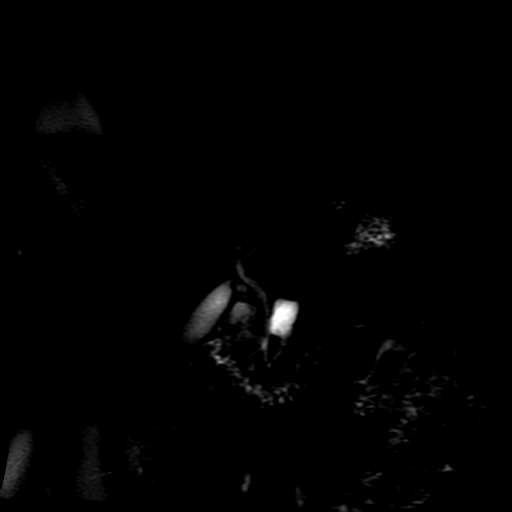

[Series 13: T1 dynamic · axial · 6.0mm · 0.78mm/px · z∈[-84,+177]mm · 3 of 88 slices shown]
[im 1/88]
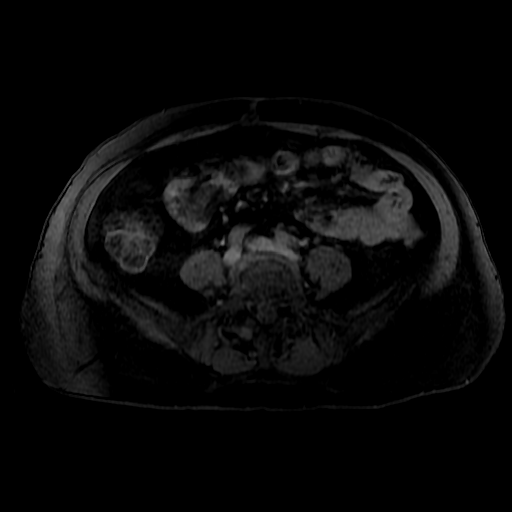
[im 44/88]
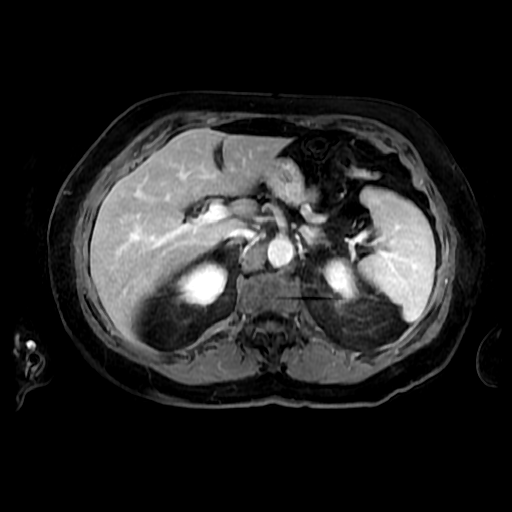
[im 88/88]
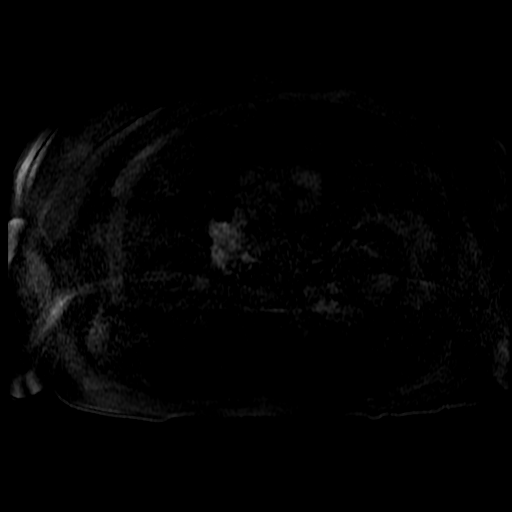

[Series 100: ((id)/(date)..88)-((id)/(id)/1..88) · axial · 6.0mm · 0.78mm/px · z∈[-84,+177]mm · 3 of 88 slices shown]
[im 1/88]
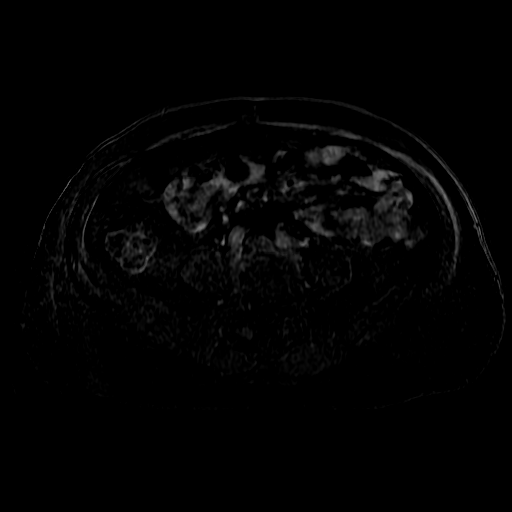
[im 44/88]
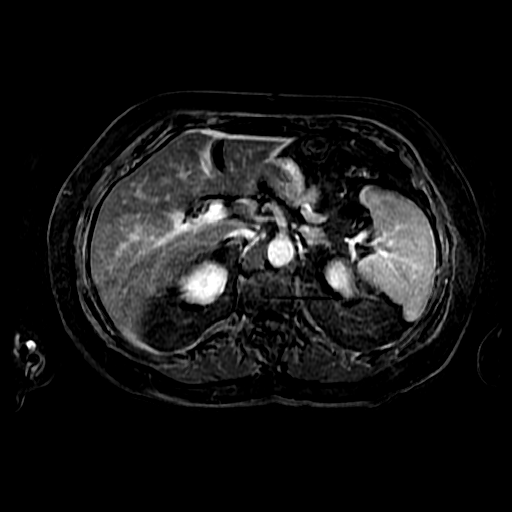
[im 88/88]
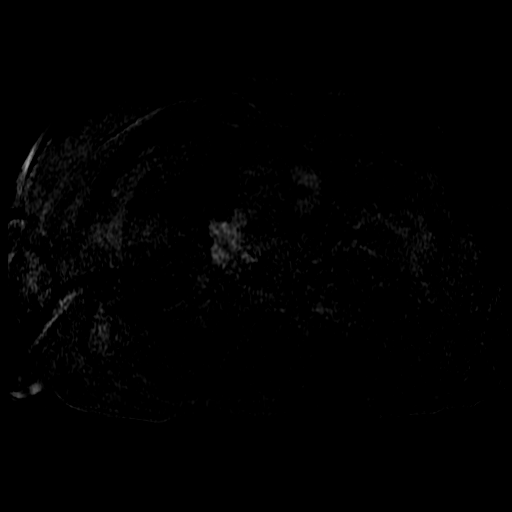

[Series 400: reformatted · axial · 1.6mm · 0.62mm/px · z∈[+28,+158]mm · 3 of 136 slices shown]
[im 1/136]
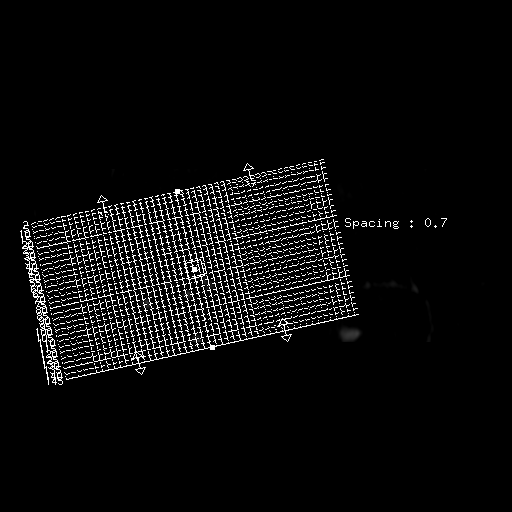
[im 46/136]
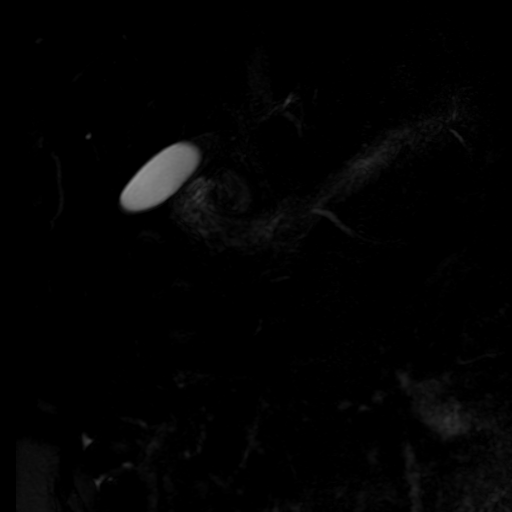
[im 91/136]
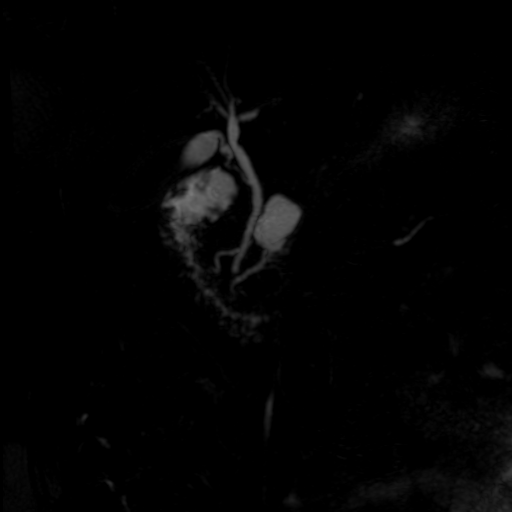

[18 of 48 positions shown; findings below may reference images not displayed]

FINDINGS: Despite efforts by the technologist and patient, motion artifact is
present on today's exam and could not be eliminated. This reduces
exam sensitivity and specificity.

Lower chest: Unremarkable

Hepatobiliary: Mild diffuse hepatic steatosis. The previous region
of unusual enhancement in the dome of the liver is no longer seen.
It is possible that this was artifactual due to some sort of
boundary or ghosting artifact on the prior MRI. There is no finding
in this vicinity on today's exam or on the recent CT scan.

The gallbladder appears unremarkable. No biliary dilatation.

Pancreas:  Pancreas divisum again noted.

Along the posterior margin of the uncinate process, a cystic lesion
measuring 2.8 by 1.7 by 1.7 cm (volume = 4.2 cm^3) is present
without a definite visible connection to the ventral duct. Measuring
this in the same fashion from the prior exam, I arrive at a
measurement of 2.7 by 1.8 by 1.5 cm (volume = 3.8 cm^3) on the MRI
of 06/10/2018. This does not qualify as interval growth. No internal
nodularity or enhancement.

Spleen:  Unremarkable

Adrenals/Urinary Tract: The adrenal glands appear normal. No
significant renal abnormality.

Stomach/Bowel: Unremarkable

Vascular/Lymphatic:  Aortoiliac atherosclerotic vascular disease.

Other:  No supplemental non-categorized findings.

Musculoskeletal: Mild thoracic and lumbar spondylosis and
degenerative disc disease.
IMPRESSION: 1. Cystic lesion along the posterior margin of the uncinate process
of the pancreas measures 4.2 cubic cm, previously 3.8 cubic cm. This
does not qualify as interval growth. This is considered low risk by
imaging. Possibilities include a postinflammatory lesion or
intraductal papillary mucinous neoplasm. We have now demonstrated 14
months of stability. Follow pancreatic protocol MRI is recommended
in 6 months time. This recommendation follows ACR consensus
guidelines: Management of Incidental Pancreatic Cysts: A White Paper
of the ACR Incidental Findings Committee. [HOSPITAL]
0818;[DATE].
2. The previous region of unusual enhancement in the dome of the
liver is no longer seen. It is possible that this was artifactual
due to some sort of boundary or ghost artifact on the prior MRI.
This is a benign finding and requires no further workup.
3. Mild diffuse hepatic steatosis.
4.  Aortic Atherosclerosis ([id number]-8HV.V).
5. Pancreas divisum.

## 2020-09-11 IMAGING — MR MR 3D RECON AT SCANNER
18 of 19 series · 18 of 19 positions shown · IV contrast (gadavist)
Comparison: CT scan 09/13/2018

CLINICAL DATA: Pancreatic cystic lesion for further
characterization.

EXAM:
MRI ABDOMEN WITHOUT AND WITH CONTRAST (INCLUDING MRCP)
TECHNIQUE: Multiplanar multisequence MR imaging of the abdomen was performed
both before and after the administration of intravenous contrast.
Heavily T2-weighted images of the biliary and pancreatic ducts were
obtained, and three-dimensional MRCP images were rendered by post
processing.
CONTRAST:  8mL GADAVIST GADOBUTROL 1 MMOL/ML IV SOLN

[Series 3: T2 fat-sat · axial · 5.0mm · 0.86mm/px · 1 of 59 slices shown]
[im 1/59]
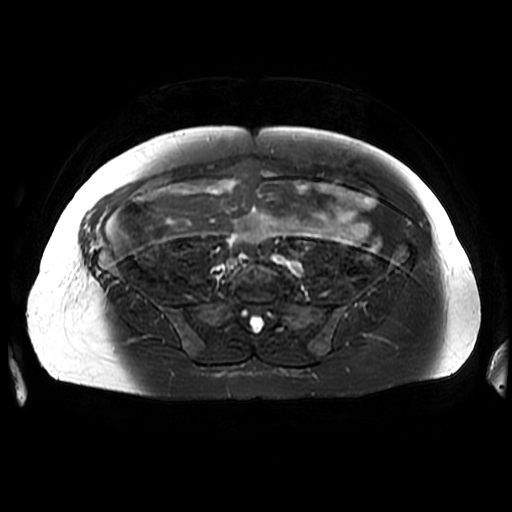

[Series 4: MRCP · coronal · 1.6mm · 0.62mm/px · 1 of 137 slices shown (1 of 3)]
[im 1/137]
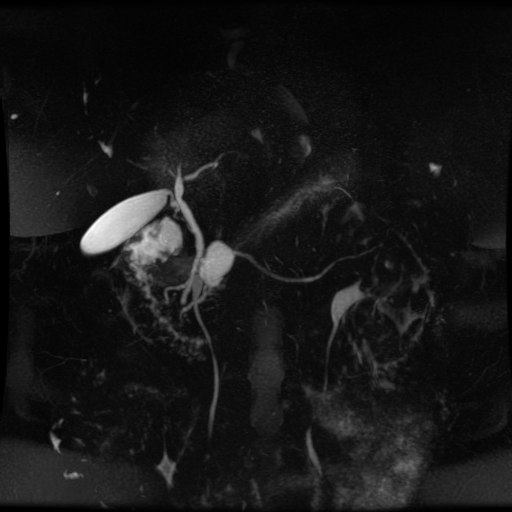

[Series 5: MRCP · coronal · 2.0mm · 0.70mm/px · 1 of 55 slices shown (2 of 3)]
[im 1/55]
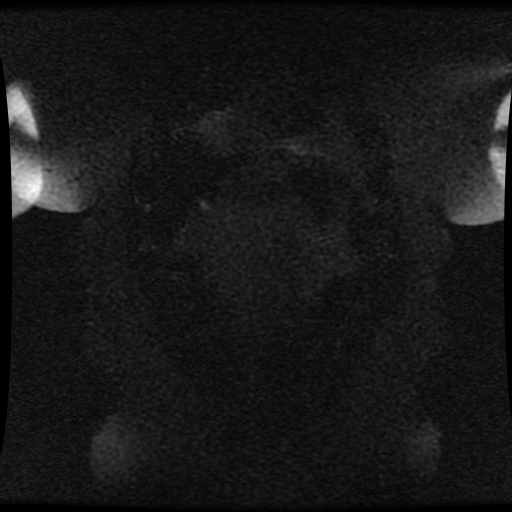

[Series 6: DWI b500 · axial · 6.0mm · 1.76mm/px · 1 of 70 slices shown]
[im 1/70]
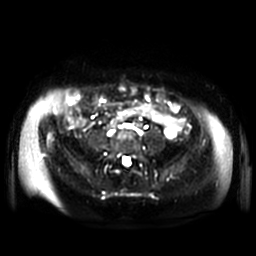

[Series 7: T2 · coronal · 5.0mm · 0.78mm/px · 1 of 44 slices shown (1 of 2)]
[im 1/44]
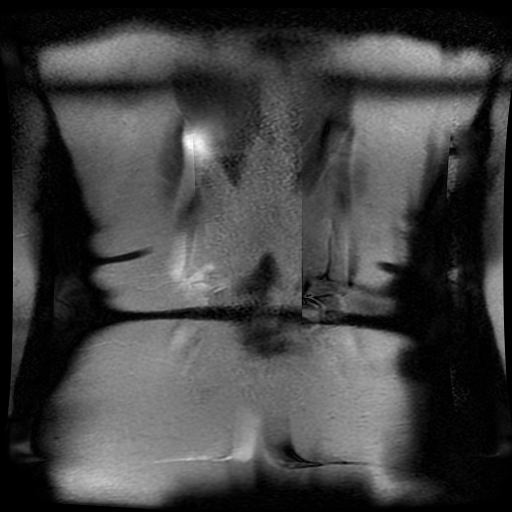

[Series 8: T2 · axial · 5.0mm · 0.86mm/px · 1 of 54 slices shown (2 of 2)]
[im 1/54]
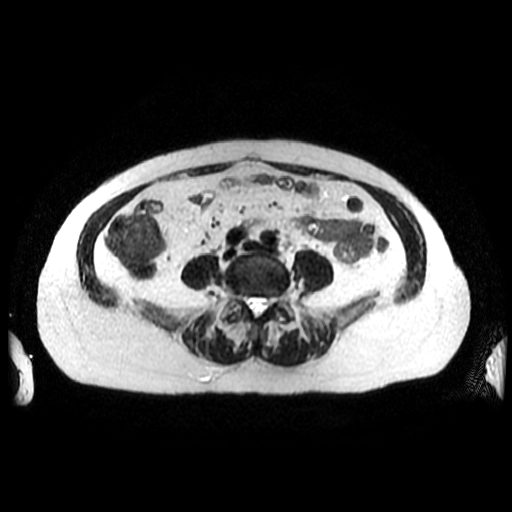

[Series 9: ax dualecho bh · axial · 5.0mm · 0.86mm/px · 1 of 108 slices shown]
[im 1/108]
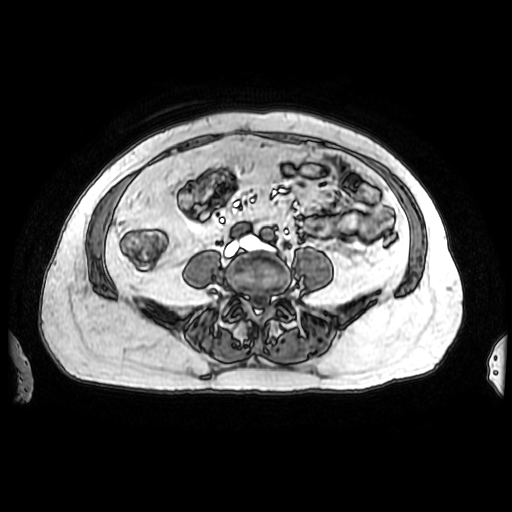

[Series 10: MRCP · coronal · 40.0mm · 0.70mm/px · 1 of 7 slices shown (3 of 3)]
[im 1/7]
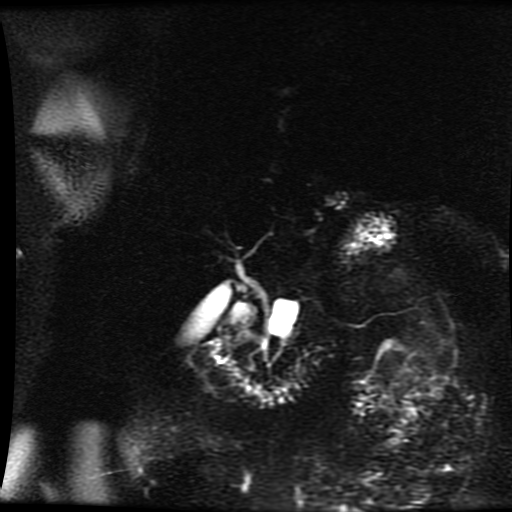

[Series 13: T1 dynamic · axial · 6.0mm · 0.78mm/px · 1 of 88 slices shown (1 of 5)]
[im 1/88]
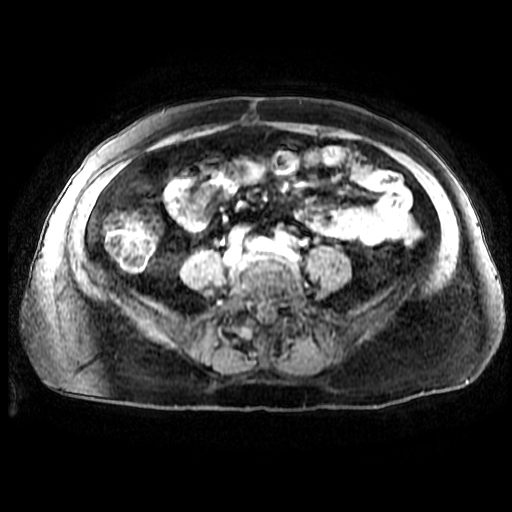

[Series 400: reformatted · axial · 1.6mm · 0.62mm/px · 1 of 136 slices shown (1 of 2)]
[im 1/136]
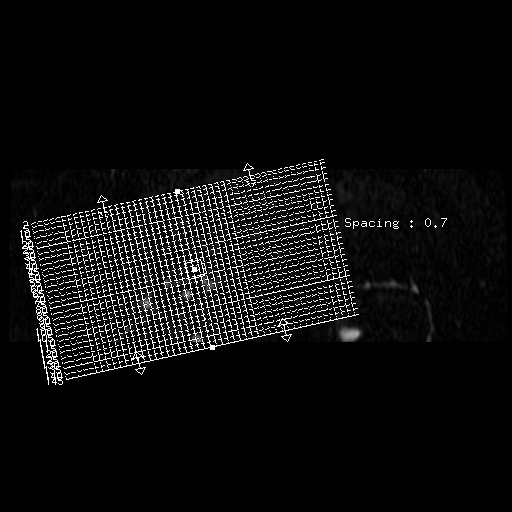

[Series 401: reformatted · coronal · 100.0mm · 0.51mm/px · 1 of 180 slices shown (2 of 2)]
[im 1/180]
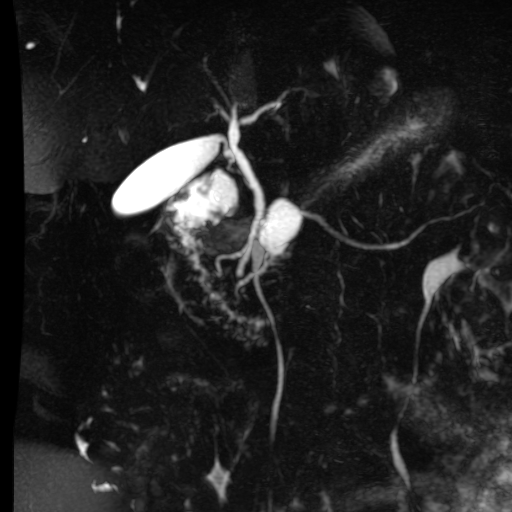

[Series 1100: T1 dynamic · axial · 6.0mm · 0.78mm/px · 1 of 88 slices shown (2 of 5)]
[im 1/88]
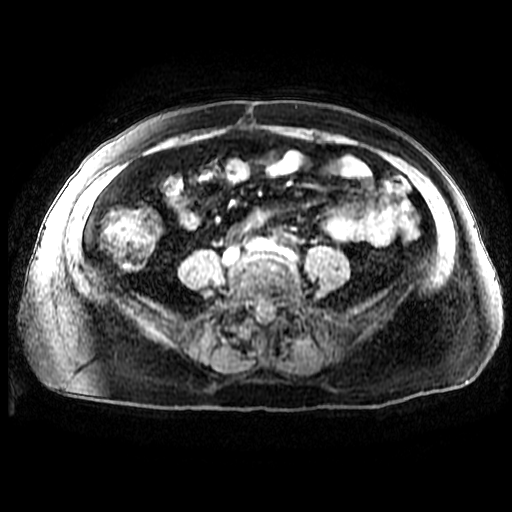

[Series 1101: T1 dynamic · axial · 6.0mm · 0.78mm/px · 1 of 88 slices shown (3 of 5)]
[im 1/88]
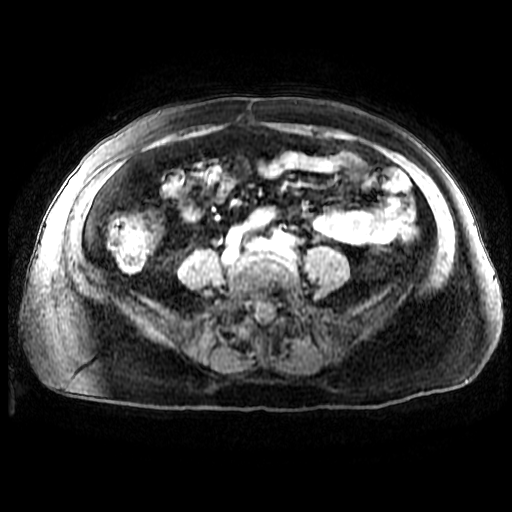

[Series 1102: T1 dynamic · axial · 6.0mm · 0.78mm/px · 1 of 88 slices shown (4 of 5)]
[im 1/88]
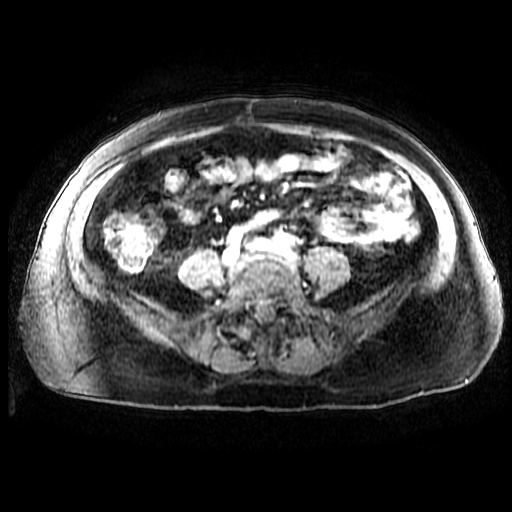

[Series 1103: T1 dynamic · axial · 6.0mm · 0.78mm/px · 1 of 88 slices shown (5 of 5)]
[im 1/88]
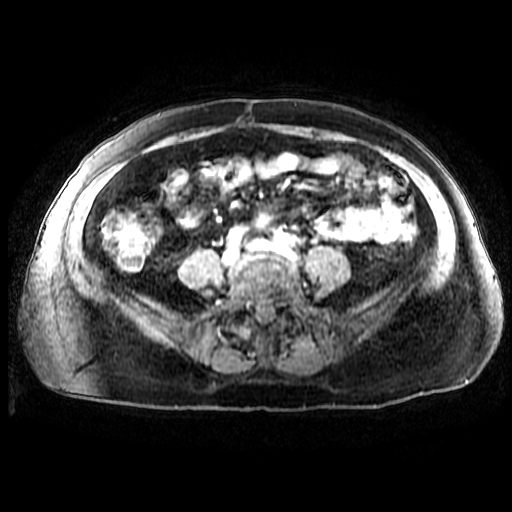

[((id)/(id)/1)-((id)/(id)/1) · axial · 6.0mm · 0.78mm/px · 1 of 86 slices shown (1 of 2)]
[im 1/86]
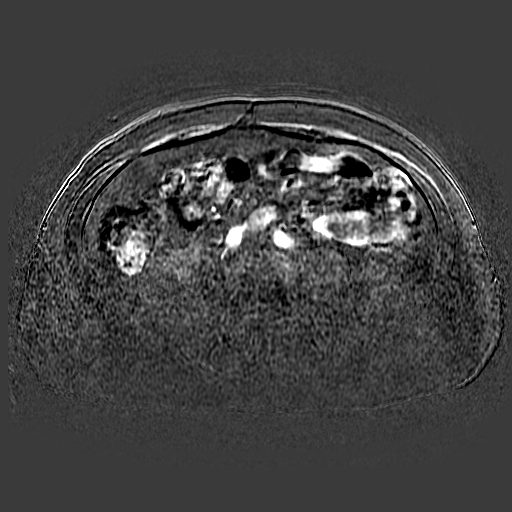

[((id)/(id)/2)-((id)/(id)/2) · axial · 6.0mm · 0.78mm/px · 1 of 88 slices shown]
[im 1/88]
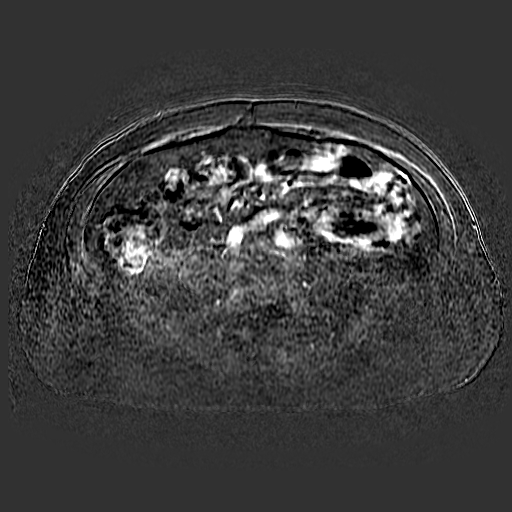

[((id)/(id)/1)-((id)/(id)/1) · axial · 6.0mm · 0.78mm/px · 1 of 88 slices shown (2 of 2)]
[im 1/88]
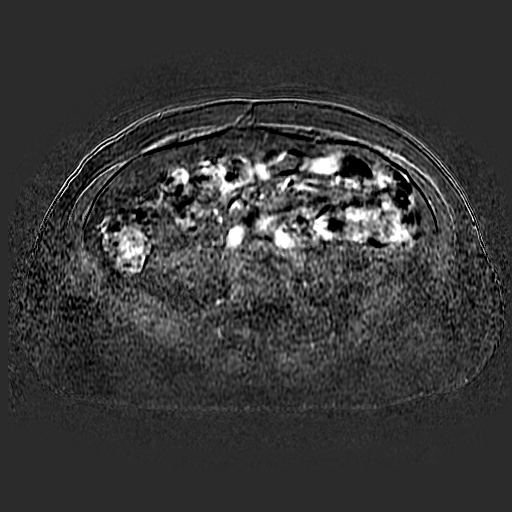

[18 of 19 positions shown; findings below may reference images not displayed]

FINDINGS: Despite efforts by the technologist and patient, motion artifact is
present on today's exam and could not be eliminated. This reduces
exam sensitivity and specificity.

Lower chest: Unremarkable

Hepatobiliary: Mild diffuse hepatic steatosis. The previous region
of unusual enhancement in the dome of the liver is no longer seen.
It is possible that this was artifactual due to some sort of
boundary or ghosting artifact on the prior MRI. There is no finding
in this vicinity on today's exam or on the recent CT scan.

The gallbladder appears unremarkable. No biliary dilatation.

Pancreas:  Pancreas divisum again noted.

Along the posterior margin of the uncinate process, a cystic lesion
measuring 2.8 by 1.7 by 1.7 cm (volume = 4.2 cm^3) is present
without a definite visible connection to the ventral duct. Measuring
this in the same fashion from the prior exam, I arrive at a
measurement of 2.7 by 1.8 by 1.5 cm (volume = 3.8 cm^3) on the MRI
of 06/10/2018. This does not qualify as interval growth. No internal
nodularity or enhancement.

Spleen:  Unremarkable

Adrenals/Urinary Tract: The adrenal glands appear normal. No
significant renal abnormality.

Stomach/Bowel: Unremarkable

Vascular/Lymphatic:  Aortoiliac atherosclerotic vascular disease.

Other:  No supplemental non-categorized findings.

Musculoskeletal: Mild thoracic and lumbar spondylosis and
degenerative disc disease.
IMPRESSION: 1. Cystic lesion along the posterior margin of the uncinate process
of the pancreas measures 4.2 cubic cm, previously 3.8 cubic cm. This
does not qualify as interval growth. This is considered low risk by
imaging. Possibilities include a postinflammatory lesion or
intraductal papillary mucinous neoplasm. We have now demonstrated 14
months of stability. Follow pancreatic protocol MRI is recommended
in 6 months time. This recommendation follows ACR consensus
guidelines: Management of Incidental Pancreatic Cysts: A White Paper
of the ACR Incidental Findings Committee. [HOSPITAL]
0818;[DATE].
2. The previous region of unusual enhancement in the dome of the
liver is no longer seen. It is possible that this was artifactual
due to some sort of boundary or ghost artifact on the prior MRI.
This is a benign finding and requires no further workup.
3. Mild diffuse hepatic steatosis.
4.  Aortic Atherosclerosis ([id number]-[id number].V).
5. Pancreas divisum.

## 2020-10-02 DIAGNOSIS — H353131 Nonexudative age-related macular degeneration, bilateral, early dry stage: Secondary | ICD-10-CM | POA: Diagnosis not present

## 2020-10-02 DIAGNOSIS — H5213 Myopia, bilateral: Secondary | ICD-10-CM | POA: Diagnosis not present

## 2020-10-05 ENCOUNTER — Ambulatory Visit (INDEPENDENT_AMBULATORY_CARE_PROVIDER_SITE_OTHER): Payer: Medicare Other

## 2020-10-05 DIAGNOSIS — Z0000 Encounter for general adult medical examination without abnormal findings: Secondary | ICD-10-CM | POA: Diagnosis not present

## 2020-10-05 NOTE — Progress Notes (Signed)
MEDICARE ANNUAL WELLNESS VISIT  10/05/2020  Telephone Visit Disclaimer This Medicare AWV was conducted by telephone due to national recommendations for restrictions regarding the COVID-19 Pandemic (e.g. social distancing).  I verified, using two identifiers, that I am speaking with Christine Cantrell or their authorized healthcare agent. I discussed the limitations, risks, security, and privacy concerns of performing an evaluation and management service by telephone and the potential availability of an in-person appointment in the future. The patient expressed understanding and agreed to proceed.  Location of Patient: Home Location of Provider (nurse):  [facility]  Subjective:    Christine Cantrell is a 74 y.o. female patient of Janora Norlander, DO who had a Medicare Annual Wellness Visit today via telephone. Christine Cantrell is Retired and lives alone. She has no children. She reports that she is socially active and does interact with friends/family regularly. She is moderately physically active and enjoys walking and working in her yard in her spare time.  Patient Care Team: Janora Norlander, DO as PCP - General (Family Medicine) Ilean China, RN as [place] [facility] Carlynn Spry, Charlott Holler, RN as Oncology Nurse Navigator Tressie Ellis, Paulette Blanch, RN as Oncology Nurse Navigator Stark Klein, MD as Consulting Physician (General Surgery) Nicholas Lose, MD as Consulting Physician (Hematology and Oncology) Gery Pray, MD as Consulting Physician (Radiation Oncology)  Advanced Directives 10/05/2020 08/22/2020 08/16/2020 10/05/2019 09/30/2018 09/28/2018 07/15/2018  Does Patient Have a Medical Advance Directive? _0  No No  Would patient like information on creating a medical advance directive? No - Patient declined No - Patient declined No - Patient declined No - Patient declined No - Patient declined No - Patient declined No - Patient declined    Hospital Utilization Over the Past 12  Months: # of hospitalizations or ER visits: 1 # of surgeries: 1  Review of Systems    Patient reports that her overall health is unchanged compared to last year.  History obtained from chart review and the patient  Patient Reported Readings (BP, Pulse, CBG, Weight, etc) none  Pain Assessment Pain : No/denies pain     Current Medications & Allergies (verified) Allergies as of 10/05/2020   No Known Allergies     Medication List       Accurate as of October 05, 2020  9:36 AM. If you have any questions, ask your nurse or doctor.        anastrozole 1 MG tablet Commonly known as: ARIMIDEX Take 1 tablet (1 mg total) by mouth daily.   Krill Oil 500 MG Caps Take 500 mg by mouth daily after lunch.   lisinopril 10 MG tablet Commonly known as: ZESTRIL Take 1 tablet (10 mg total) by mouth daily.   lisinopril-hydrochlorothiazide 20-12.5 MG tablet Commonly known as: ZESTORETIC Take 1 tablet by mouth daily.   Lubricant Eye Drops 0.4-0.3 % Soln Generic drug: Polyethyl Glycol-Propyl Glycol Place 1 drop into both eyes 3 (three) times daily as needed (dry eyes.).   Magnesium 250 MG Tabs Take 250 mg by mouth daily after lunch.   multivitamin with minerals Tabs tablet Take 1 tablet by mouth daily after lunch. Women's One A Day Multivitamin   oxyCODONE 5 MG immediate release tablet Commonly known as: Oxy IR/ROXICODONE Take 1 tablet (5 mg total) by mouth every 6 (six) hours as needed for severe pain.   QUNOL ULTRA COQ10 PO Take 1 capsule by mouth daily after lunch.   simvastatin 20 MG tablet Commonly known as:  ZOCOR Take 1 tablet (20 mg total) by mouth daily.   Synthroid 75 MCG tablet Generic drug: levothyroxine TAKE 1 TABLET (75 MCG TOTAL) BY MOUTH DAILY BEFORE BREAKFAST.       History (reviewed): Past Medical History:  Diagnosis Date  . Abnormal EKG saw dr hochrein 3 yrs ago for   hx of left bundle branch block on ekg since 2002  . Breast cancer ([place])   .  Depression   . Diverticulosis large intestine w/o perforation or abscess w/bleeding 2016  . Family history of lung cancer   . Family history of pancreatic cancer   . Family history of prostate cancer   . GERD (gastroesophageal reflux disease)    hx of  . High cholesterol   . History of hiatal hernia   . Hx of colonic polyps 2016   adenomatous  . Hypertension   . Hypothyroidism   . Pancreatic cyst   . Thyroid disease    hypothyroidism   Past Surgical History:  Procedure Laterality Date  . ABDOMINAL HYSTERECTOMY  2002   complete for plyps  . BREAST LUMPECTOMY WITH RADIOACTIVE SEED LOCALIZATION Right 08/22/2020   Procedure: RIGHT BREAST LUMPECTOMY WITH RADIOACTIVE SEED LOCALIZATION;  Surgeon: Stark Klein, MD;  Location: [facility];  Service: General;  Laterality: Right;  . ESOPHAGOGASTRODUODENOSCOPY N/A 09/30/2018   Procedure: ESOPHAGOGASTRODUODENOSCOPY (EGD);  Surgeon: Milus Banister, MD;  Location: [facility];  Service: Endoscopy;  Laterality: N/A;  . EUS N/A 09/30/2018   Procedure: UPPER ENDOSCOPIC ULTRASOUND (EUS) RADIAL;  Surgeon: Milus Banister, MD;  Location: [facility];  Service: Endoscopy;  Laterality: N/A;  . LIVER BIOPSY     normal result  . thryoid needle biopsy  1990's  . THYROIDECTOMY  1982   Family History  Problem Relation Age of Onset  . High Cholesterol Mother   . Hypertension Mother   . Goiter Mother   . High Cholesterol Father   . Hypertension Father   . Hypertension Brother   . Lung disease Brother        chronic smoker  . Heart disease Brother   . Goiter Sister   . Heart disease Brother   . Hypertension Brother   . Prostate cancer Brother 56  . Lung cancer Maternal Uncle        smoker  . Pancreatic cancer Nephew        dx. 40s/50s  . Cancer Nephew        pancreatic vs. multiple myeloma; dx. 40s/50s  . Colon cancer Neg Hx   . Stomach cancer Neg Hx    Social History   Socioeconomic History  . Marital status: Single    Spouse name: Not on  file  . Number of children: 0  . Years of education: 16  . Highest education level: Bachelor's degree (e.g., BA, AB, BS)  Occupational History  . Occupation: retired    Comment: Therapist, nutritional  Tobacco Use  . Smoking status: Never Smoker  . Smokeless tobacco: Never Used  Vaping Use  . Vaping Use: Never used  Substance and Sexual Activity  . Alcohol use: Yes    Alcohol/week: 1.0 standard drink    Types: 1 Glasses of wine per week    Comment: occasionally  . Drug use: Never  . Sexual activity: Not Currently    Partners: Male    Birth control/protection: Surgical  Other Topics Concern  . Not on file  Social History Narrative   Lives alone.  No children  Social Determinants of Health   Financial Resource Strain: Low Risk   . Difficulty of Paying Living Expenses: Not hard at all  Food Insecurity: No Food Insecurity  . Worried About Charity fundraiser in the Last Year: Never true  . Ran Out of Food in the Last Year: Never true  Transportation Needs: No Transportation Needs  . Lack of Transportation (Medical): No  . Lack of Transportation (Non-Medical): No  Physical Activity: Sufficiently Active  . Days of Exercise per Week: 5 days  . Minutes of Exercise per Session: 40 min  Stress: No Stress Concern Present  . Feeling of Stress : Not at all  Social Connections: Moderately Integrated  . Frequency of Communication with Friends and Family: More than three times a week  . Frequency of Social Gatherings with Friends and Family: More than three times a week  . Attends Religious Services: More than 4 times per year  . Active Member of Clubs or Organizations: Yes  . Attends Archivist Meetings: More than 4 times per year  . Marital Status: Never married    Activities of Daily Living In your present state of health, do you have any difficulty performing the following activities: 10/05/2020 08/22/2020  Hearing? 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 N -  Difficulty concentrating or making decisions? N -  Walking or climbing stairs? N -  Dressing or bathing? N -  Doing errands, shopping? N N  Preparing Food and eating ? N -  Using the Toilet? N -  In the past six months, have you accidently leaked urine? N -  Do you have problems with loss of bowel control? N -  Managing your Medications? N -  Managing your Finances? N -  Housekeeping or managing your Housekeeping? - -  Some recent data might be hidden   Patient reports she has noticed some hearing loss but has not had a formal hearing evaluation.  Patient Education/ Literacy How often do you need to have someone help you when you read instructions, pamphlets, or other written materials from your doctor or pharmacy?: 1 - Never What is the last grade level you completed in school?: Bachelors degree  Exercise Current Exercise Habits: Home exercise routine, Type of exercise: walking, Time (Minutes): 45, Frequency (Times/Week): 6, Weekly Exercise (Minutes/Week): 270, Intensity: Mild, Exercise limited by: None identified  Diet Patient reports consuming 3 meals a day and 0 snack(s) a day Patient reports that her primary diet is: Low fat and low sodium Patient reports that she does have regular access to food.   Depression Screen PHQ 2/9 Scores 10/05/2020 06/19/2020 05/14/2020 10/05/2019 09/27/2019 06/27/2019 09/17/2018  PHQ - 2 Score 0 0 0 0 0 0 0  PHQ- 9 Score - - - - 0 - -     Fall Risk Fall Risk  10/05/2020 06/19/2020 10/05/2019 09/27/2019 06/27/2019  Falls in the past year? 0 0 0 0 0  Number falls in past yr: - - 0 - -  Injury with Fall? - - 0 - -  Follow up Falls evaluation completed - Falls prevention discussed - -  Comment - - Get rid of all throw rugs in the house, adequate lighting in the walkways and grab bars in the bathroom - -     Objective:  Christine Cantrell seemed alert and oriented and she participated appropriately during our telephone visit.  Blood Pressure  Weight BMI  BP Readings from Last 3 Encounters:  08/30/20 (!) 155/71  08/22/20 128/64  08/16/20 104/83   Wt Readings from Last 3 Encounters:  08/30/20 162 lb 8 oz (73.7 kg)  08/22/20 162 lb (73.5 kg)  08/16/20 162 lb 9.6 oz (73.8 kg)   BMI Readings from Last 1 Encounters:  08/30/20 26.23 kg/m    *Unable to obtain current vital signs, weight, and BMI due to telephone visit type  Hearing/Vision  . Christine Cantrell did not seem to have difficulty with hearing/understanding during the telephone conversation . Reports that she has had a formal eye exam by an eye care professional within the past year . Reports that she has not had a formal hearing evaluation within the past year *Unable to fully assess hearing and vision during telephone visit type  Cognitive Function: 6CIT Screen 10/05/2020 10/05/2019  What Year? 0 points 0 points  What month? - 0 points  What time? 0 points 0 points  Count back from 20 0 points 0 points  Months in reverse 0 points 0 points  Repeat phrase 0 points 2 points  Total Score - 2   (Normal:0-7, Significant for Dysfunction: >8)  Normal Cognitive Function Screening: Yes   Immunization & Health Maintenance Record Immunization History  Administered Date(s) Administered  . Fluad Quad(high Dose 65+) 08/27/2019  . Influenza, High Dose Seasonal PF 09/07/2018  . Influenza-Unspecified 09/26/2015, 09/17/2016, 09/25/2017  . PFIZER SARS-COV-2 Vaccination 01/20/2020, 02/10/2020, 08/28/2020  . Pneumococcal Polysaccharide-23 09/25/2017  . Zoster Recombinat (Shingrix) 06/10/2017, 09/03/2017    Health Maintenance  Topic Date Due  . TETANUS/TDAP  Never done  . INFLUENZA VACCINE  07/29/2020  . DEXA SCAN  Completed  . COVID-19 Vaccine  Completed  . Hepatitis C Screening  Completed  . PNA vac Low Risk Adult  Completed       Assessment  This is a routine wellness examination for Christine Cantrell.  Health Maintenance: Due or Overdue Health Maintenance Due  Topic Date Due   . TETANUS/TDAP  Never done  . INFLUENZA VACCINE  07/29/2020    Christine Cantrell does not need a referral for Community Assistance: Care Management:   no Social Work:    no Prescription Assistance:  no Nutrition/Diabetes Education:  no   Plan:  Personalized Goals Goals Addressed            This Visit's Progress   . Patient Stated       10/05/2020 AWV Goal: Fall Prevention  . Over the next year, patient will decrease their risk for falls by: o Using assistive devices, such as a cane or walker, as needed o Identifying fall risks within their home and correcting them by: - Removing throw rugs - Adding handrails to stairs or ramps - Removing clutter and keeping a clear pathway throughout the home - Increasing light, especially at night - Adding shower handles/bars - Raising toilet seat o Identifying potential personal risk factors for falls: - Medication side effects - Incontinence/urgency - Vestibular dysfunction - Hearing loss - Musculoskeletal disorders - Neurological disorders - Orthostatic hypotension        Personalized Health Maintenance & Screening Recommendations  Pneumococcal vaccine  Td vaccine  Lung Cancer Screening Recommended: no (Low Dose CT Chest recommended if Age 31-80 years, 30 pack-year currently smoking OR have quit w/in past 15 years) Hepatitis C Screening recommended: no HIV Screening recommended: no  Advanced Directives: Written information was not prepared per patient's request.  Referrals & Orders No orders of the defined types were  placed in this encounter.   Follow-up Plan . Follow-up with Janora Norlander, DO as planned . Schedule Tetanus and Pnemonia vaccine    I have personally reviewed and noted the following in the patient's chart:   . Medical and social history . Use of alcohol, tobacco or illicit drugs  . Current medications and supplements . Functional ability and status . Nutritional status . Physical  activity . Advanced directives . List of other physicians . Hospitalizations, surgeries, and ER visits in previous 12 months . Vitals . Screenings to include cognitive, depression, and falls . Referrals and appointments  In addition, I have reviewed and discussed with Christine Cantrell certain preventive protocols, quality metrics, and best practice recommendations. A written personalized care plan for preventive services as well as general preventive health recommendations is available and can be mailed to the patient at her request.      Felicity Coyer, LPN    96/03/1892

## 2020-10-16 ENCOUNTER — Ambulatory Visit: Payer: Medicare Other | Admitting: *Deleted

## 2020-10-16 DIAGNOSIS — I10 Essential (primary) hypertension: Secondary | ICD-10-CM

## 2020-10-16 DIAGNOSIS — E782 Mixed hyperlipidemia: Secondary | ICD-10-CM

## 2020-10-16 NOTE — Patient Instructions (Addendum)
Visit Information  Goals Addressed            This Visit's Progress   . Chronic Disease Management Needs   On track    CARE PLAN ENTRY (see longtitudinal plan of care for additional care plan information)  Current Barriers:  . Chronic Disease Management support, education, and care coordination needs related to HTN, GERD, hypothyroidism, sarcoidosis, HLD, breast cancer  Clinical Goal(s) related to HTN, GERD, hypothyroidism, sarcoidosis, HLD, breast cancer:  Over the next 90 days, patient will:  . Call provider office for new or worsened signs and symptoms  . Keep all scheduled medical appointments  Interventions related to HTN, GERD, hypothyroidism, sarcoidosis, HLD, breast cancer:  . Evaluation of current treatment plans and patient's adherence to plan as established by provider . Chart reviewed including recent office notes, lab results, and imaging reports . Per patient, she has no CCM or resource needs at this time but will reach out as needed . Previously provided patient with CCM contact information and encouraged to reach out as needed  Patient Self Care Activities related to HTN, GERD, hypothyroidism, sarcoidosis, HLD, breast Cancer:  . Patient is able to perform ADLs and IADLs independently   Please see past updates related to this goal by clicking on the "Past Updates" button in the selected goal         Patient verbalizes understanding of instructions provided today.   Follow-up Plan Patient does not have any CCM or resource needs at this time and will be removed from the CCM program. She has been provided with CCM contact information and encouraged to reach out as needed.    Chong Sicilian, BSN, RN-BC Embedded Chronic Care Manager [facility] [place] Management Direct Dial: [phone]

## 2020-10-16 NOTE — Chronic Care Management (AMB) (Signed)
Chronic Care Management   Follow Up Note   10/16/2020 Name: Christine Cantrell MRN: [id number] DOB: April 02, 1946  Referred by: Janora Norlander, DO Reason for referral : Chronic Care Management (RN follow up)   Christine Cantrell is a 74 y.o. year old female who is a primary care patient of Janora Norlander, DO. The CCM team was consulted for assistance with chronic disease management and care coordination needs.    Review of patient status, including review of consultants reports, relevant laboratory and other test results, and collaboration with appropriate care team members and the patient's provider was performed as part of comprehensive patient evaluation and provision of chronic care management services.    SDOH (Social Determinants of Health) assessments performed: No See Care Plan activities for detailed interventions related to [facility])     Outpatient Encounter Medications as of 10/16/2020  Medication Sig Note  . anastrozole (ARIMIDEX) 1 MG tablet Take 1 tablet (1 mg total) by mouth daily.   . Coenzyme Q10-Vitamin E (QUNOL ULTRA COQ10 PO) Take 1 capsule by mouth daily after lunch.    Javier Docker Oil 500 MG CAPS Take 500 mg by mouth daily after lunch.    . lisinopril (ZESTRIL) 10 MG tablet Take 1 tablet (10 mg total) by mouth daily. 08/14/2020: Pt takes both doses of lisinopril together  . lisinopril-hydrochlorothiazide (ZESTORETIC) 20-12.5 MG tablet Take 1 tablet by mouth daily. 08/14/2020: Pt takes both doses of lisinopril together  . Magnesium 250 MG TABS Take 250 mg by mouth daily after lunch.    . Multiple Vitamin (MULTIVITAMIN WITH MINERALS) TABS tablet Take 1 tablet by mouth daily after lunch. Women's One A Day Multivitamin    . oxyCODONE (OXY IR/ROXICODONE) 5 MG immediate release tablet Take 1 tablet (5 mg total) by mouth every 6 (six) hours as needed for severe pain.   Vladimir Faster Glycol-Propyl Glycol (LUBRICANT EYE DROPS) 0.4-0.3 % SOLN Place 1 drop into both eyes 3 (three) times  daily as needed (dry eyes.).   Marland Kitchen simvastatin (ZOCOR) 20 MG tablet Take 1 tablet (20 mg total) by mouth daily.   Marland Kitchen SYNTHROID 75 MCG tablet TAKE 1 TABLET (75 MCG TOTAL) BY MOUTH DAILY BEFORE BREAKFAST.    No facility-administered encounter medications on file as of 10/16/2020.    Goals Addressed            This Visit's Progress   . Chronic Disease Management Needs   On track    CARE PLAN ENTRY (see longtitudinal plan of care for additional care plan information)  Current Barriers:  . Chronic Disease Management support, education, and care coordination needs related to HTN, GERD, hypothyroidism, sarcoidosis, HLD, breast cancer  Clinical Goal(s) related to HTN, GERD, hypothyroidism, sarcoidosis, HLD, breast cancer:  Over the next 90 days, patient will:  . Call provider office for new or worsened signs and symptoms  . Keep all scheduled medical appointments  Interventions related to HTN, GERD, hypothyroidism, sarcoidosis, HLD, breast cancer:  . Evaluation of current treatment plans and patient's adherence to plan as established by provider . Chart reviewed including recent office notes, lab results, and imaging reports . Per patient, she has no CCM or resource needs at this time but will reach out as needed . Previously provided patient with CCM contact information and encouraged to reach out as needed  Patient Self Care Activities related to HTN, GERD, hypothyroidism, sarcoidosis, HLD, breast Cancer:  . Patient is able to perform ADLs and IADLs independently   Please see  past updates related to this goal by clicking on the "Past Updates" button in the selected goal         Plan:  Patient does not have any CCM or resource needs at this time and will be removed from the CCM program. She has been provided with CCM contact information and encouraged to reach out as needed.    Chong Sicilian, BSN, RN-BC Embedded Chronic Care Manager [facility] [place]  Management Direct Dial: [phone]

## 2020-11-05 ENCOUNTER — Telehealth: Payer: Self-pay

## 2020-11-05 ENCOUNTER — Other Ambulatory Visit: Payer: Self-pay

## 2020-11-05 DIAGNOSIS — K862 Cyst of pancreas: Secondary | ICD-10-CM

## 2020-11-05 NOTE — Progress Notes (Signed)
Pt is due for recall MR Abdomen MRCP W WO contrast.

## 2020-11-05 NOTE — Telephone Encounter (Signed)
Called and scheduled pt for MR MRCP for pancreatic cyst on Tuesday, 11-16 at 10:00am at [facility], to arrive at 9:45am, NPO 6 hours.  Called and spoke to pt.  She was agreeable to date and time.

## 2020-11-05 NOTE — Telephone Encounter (Signed)
-----   Message from Roetta Sessions, [place] sent at 10/29/2020  9:12 AM EDT ----- Regarding: FW: due for [facility] for pancreatic cyst  ----- Message ----- From: Roetta Sessions, CMA Sent: 10/29/2020 To: Roetta Sessions, CMA Subject: due for [facility] for pancreatic cyst               Pt will be due for MRCP in 10-2020

## 2020-11-13 ENCOUNTER — Other Ambulatory Visit: Payer: Self-pay | Admitting: Gastroenterology

## 2020-11-13 ENCOUNTER — Other Ambulatory Visit: Payer: Self-pay

## 2020-11-13 ENCOUNTER — Ambulatory Visit (HOSPITAL_COMMUNITY)
Admission: RE | Admit: 2020-11-13 | Discharge: 2020-11-13 | Disposition: A | Discharge status: Home / Self Care | Payer: Medicare Other | Source: Ambulatory Visit | Attending: Gastroenterology | Admitting: Gastroenterology

## 2020-11-13 DIAGNOSIS — K862 Cyst of pancreas: Secondary | ICD-10-CM

## 2020-11-13 DIAGNOSIS — R932 Abnormal findings on diagnostic imaging of liver and biliary tract: Secondary | ICD-10-CM | POA: Diagnosis not present

## 2020-11-13 MED ORDER — GADOBUTROL 1 MMOL/ML IV SOLN
7.0000 mL | Freq: Once | INTRAVENOUS | Status: AC | PRN
  Administered 2020-11-13: 7 mL via INTRAVENOUS

## 2020-11-30 ENCOUNTER — Other Ambulatory Visit: Payer: Self-pay

## 2020-11-30 ENCOUNTER — Encounter: Payer: Self-pay | Admitting: Adult Health

## 2020-11-30 ENCOUNTER — Inpatient Hospital Stay: Payer: Medicare Other | Attending: Hematology and Oncology | Admitting: Adult Health

## 2020-11-30 ENCOUNTER — Telehealth: Payer: Self-pay

## 2020-11-30 VITALS — BP 149/75 | HR 95 | Temp 98.6°F | Resp 19 | Ht 66.0 in | Wt 161.3 lb

## 2020-11-30 DIAGNOSIS — I10 Essential (primary) hypertension: Secondary | ICD-10-CM | POA: Insufficient documentation

## 2020-11-30 DIAGNOSIS — Z170 Estrogen receptor positive status [ER+]: Secondary | ICD-10-CM | POA: Diagnosis not present

## 2020-11-30 DIAGNOSIS — Z801 Family history of malignant neoplasm of trachea, bronchus and lung: Secondary | ICD-10-CM | POA: Insufficient documentation

## 2020-11-30 DIAGNOSIS — Z79899 Other long term (current) drug therapy: Secondary | ICD-10-CM | POA: Diagnosis not present

## 2020-11-30 DIAGNOSIS — R5383 Other fatigue: Secondary | ICD-10-CM | POA: Diagnosis not present

## 2020-11-30 DIAGNOSIS — C50211 Malignant neoplasm of upper-inner quadrant of right female breast: Secondary | ICD-10-CM

## 2020-11-30 DIAGNOSIS — E7800 Pure hypercholesterolemia, unspecified: Secondary | ICD-10-CM | POA: Diagnosis not present

## 2020-11-30 DIAGNOSIS — K219 Gastro-esophageal reflux disease without esophagitis: Secondary | ICD-10-CM | POA: Insufficient documentation

## 2020-11-30 DIAGNOSIS — E039 Hypothyroidism, unspecified: Secondary | ICD-10-CM | POA: Diagnosis not present

## 2020-11-30 DIAGNOSIS — Z800 Family history of malignant neoplasm of digestive organs: Secondary | ICD-10-CM | POA: Insufficient documentation

## 2020-11-30 NOTE — Progress Notes (Signed)
SURVIVORSHIP VISIT:   BRIEF ONCOLOGIC HISTORY:  Oncology History  Malignant neoplasm of upper-inner quadrant of right breast in female, estrogen receptor positive ([place])  07/06/2020 Initial Diagnosis   Screening mammogram detected a 0.8cm right breast mass. Diagnostic mammogram showed the mass measuring 0.7cm, with no right axillary adenopathy. 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   07/11/2020 Cancer Staging   Staging form: Breast, AJCC 8th Edition - Clinical stage from 07/11/2020: Stage IA (cT1b, cN0, cM0, G2, ER+, PR+, HER2-)   08/22/2020 Surgery   Right lumpectomy Barry Dienes) ([id number]): IDC, 1.1cm, grade 2, clear margins. No regional lymph nodes were examined.   08/2020 - 08/2025 Anti-estrogen oral therapy   Anatrozole   11/04/2020 Cancer Staging   Staging form: Breast, AJCC 8th Edition - Pathologic: Stage IA (pT1c, pN0, cM0, G2, ER+, PR+, HER2-)     INTERVAL HISTORY:  Christine Cantrell to review her survivorship care plan detailing her treatment course for breast cancer, as well as monitoring long-term side effects of that treatment, education regarding health maintenance, screening, and overall wellness and health promotion.     Overall, Christine Cantrell reports feeling quite well.  She continues on Anastrozole with good tolerance.  She has some mild fatigue, but overall is quite well.    REVIEW OF SYSTEMS:  Review of Systems  Constitutional: Positive for fatigue. Negative for appetite change, chills, fever and unexpected weight change.  HENT:   Negative for hearing loss, lump/mass and trouble swallowing.   Eyes: Negative for eye problems and icterus.  Respiratory: Negative for chest tightness, cough and shortness of breath.   Cardiovascular: Negative for chest pain, leg swelling and palpitations.  Gastrointestinal: Negative for abdominal distention, abdominal pain, constipation, diarrhea, nausea and vomiting.  Endocrine: Negative for hot  flashes.  Genitourinary: Negative for difficulty urinating.   Musculoskeletal: Negative for arthralgias.  Skin: Negative for itching and rash.  Neurological: Negative for dizziness, extremity weakness, headaches and numbness.  Hematological: Negative for adenopathy. Does not bruise/bleed easily.  Psychiatric/Behavioral: Negative for depression. The patient is not nervous/anxious.   Breast: Denies any new nodularity, masses, tenderness, nipple changes, or nipple discharge.      ONCOLOGY TREATMENT TEAM:  1. Surgeon:  Dr. Barry Dienes at [facility] 2. Medical Oncologist: Dr. Lindi Adie  3. Radiation Oncologist: Dr. Sondra Come    PAST MEDICAL/SURGICAL HISTORY:  Past Medical History:  Diagnosis Date  . Abnormal EKG saw dr hochrein 3 yrs ago for   hx of left bundle branch block on ekg since 2002  . Breast cancer ([place])   . Depression   . Diverticulosis large intestine w/o perforation or abscess w/bleeding 2016  . Family history of lung cancer   . Family history of pancreatic cancer   . Family history of prostate cancer   . GERD (gastroesophageal reflux disease)    hx of  . High cholesterol   . History of hiatal hernia   . Hx of colonic polyps 2016   adenomatous  . Hypertension   . Hypothyroidism   . Pancreatic cyst   . Thyroid disease    hypothyroidism   Past Surgical History:  Procedure Laterality Date  . ABDOMINAL HYSTERECTOMY  2002   complete for plyps  . BREAST LUMPECTOMY WITH RADIOACTIVE SEED LOCALIZATION Right 08/22/2020   Procedure: RIGHT BREAST LUMPECTOMY WITH RADIOACTIVE SEED LOCALIZATION;  Surgeon: Stark Klein, MD;  Location: [place];  Service: General;  Laterality: Right;  . ESOPHAGOGASTRODUODENOSCOPY N/A 09/30/2018  Procedure: ESOPHAGOGASTRODUODENOSCOPY (EGD);  Surgeon: Milus Banister, MD;  Location: [facility];  Service: Endoscopy;  Laterality: N/A;  . EUS N/A 09/30/2018   Procedure: UPPER ENDOSCOPIC ULTRASOUND (EUS) RADIAL;  Surgeon: Milus Banister, MD;   Location: [facility];  Service: Endoscopy;  Laterality: N/A;  . LIVER BIOPSY     normal result  . thryoid needle biopsy  1990's  . THYROIDECTOMY  1982     ALLERGIES:  No Known Allergies   CURRENT MEDICATIONS:  Outpatient Encounter Medications as of 11/30/2020  Medication Sig Note  . anastrozole (ARIMIDEX) 1 MG tablet Take 1 tablet (1 mg total) by mouth daily.   . Coenzyme Q10-Vitamin E (QUNOL ULTRA COQ10 PO) Take 1 capsule by mouth daily after lunch.    Javier Docker Oil 500 MG CAPS Take 500 mg by mouth daily after lunch.    . lisinopril (ZESTRIL) 10 MG tablet Take 1 tablet (10 mg total) by mouth daily. 08/14/2020: Pt takes both doses of lisinopril together  . lisinopril-hydrochlorothiazide (ZESTORETIC) 20-12.5 MG tablet Take 1 tablet by mouth daily. 08/14/2020: Pt takes both doses of lisinopril together  . Magnesium 250 MG TABS Take 250 mg by mouth daily after lunch.    . Multiple Vitamin (MULTIVITAMIN WITH MINERALS) TABS tablet Take 1 tablet by mouth daily after lunch. Women's One A Day Multivitamin    . Polyethyl Glycol-Propyl Glycol (LUBRICANT EYE DROPS) 0.4-0.3 % SOLN Place 1 drop into both eyes 3 (three) times daily as needed (dry eyes.).   Marland Kitchen simvastatin (ZOCOR) 20 MG tablet Take 1 tablet (20 mg total) by mouth daily.   Marland Kitchen SYNTHROID 75 MCG tablet TAKE 1 TABLET (75 MCG TOTAL) BY MOUTH DAILY BEFORE BREAKFAST.   . [DISCONTINUED] oxyCODONE (OXY IR/ROXICODONE) 5 MG immediate release tablet Take 1 tablet (5 mg total) by mouth every 6 (six) hours as needed for severe pain.    No facility-administered encounter medications on file as of 11/30/2020.     ONCOLOGIC FAMILY HISTORY:  Family History  Problem Relation Age of Onset  . High Cholesterol Mother   . Hypertension Mother   . Goiter Mother   . High Cholesterol Father   . Hypertension Father   . Hypertension Brother   . Lung disease Brother        chronic smoker  . Heart disease Brother   . Goiter Sister   . Heart disease Brother    . Hypertension Brother   . Prostate cancer Brother 42  . Lung cancer Maternal Uncle        smoker  . Pancreatic cancer Nephew        dx. 40s/50s  . Cancer Nephew        pancreatic vs. multiple myeloma; dx. 40s/50s  . Colon cancer Neg Hx   . Stomach cancer Neg Hx      GENETIC COUNSELING/TESTING: Not at this time  SOCIAL HISTORY:  Social History   Socioeconomic History  . Marital status: Single    Spouse name: Not on file  . Number of children: 0  . Years of education: 16  . Highest education level: Bachelor's degree (e.g., BA, AB, BS)  Occupational History  . Occupation: retired    Comment: Therapist, nutritional  Tobacco Use  . Smoking status: Never Smoker  . Smokeless tobacco: Never Used  Vaping Use  . Vaping Use: Never used  Substance and Sexual Activity  . Alcohol use: Yes    Alcohol/week: 1.0 standard drink    Types: 1 Glasses  of wine per week    Comment: occasionally  . Drug use: Never  . Sexual activity: Not Currently    Partners: Male    Birth control/protection: Surgical  Other Topics Concern  . Not on file  Social History Narrative   Lives alone.  No children   Social Determinants of Health   Financial Resource Strain: Low Risk   . Difficulty of Paying Living Expenses: Not hard at all  Food Insecurity: No Food Insecurity  . Worried About Charity fundraiser in the Last Year: Never true  . Ran Out of Food in the Last Year: Never true  Transportation Needs: No Transportation Needs  . Lack of Transportation (Medical): No  . Lack of Transportation (Non-Medical): No  Physical Activity: Sufficiently Active  . Days of Exercise per Week: 5 days  . Minutes of Exercise per Session: 40 min  Stress: No Stress Concern Present  . Feeling of Stress : Not at all  Social Connections: Moderately Integrated  . Frequency of Communication with Friends and Family: More than three times a week  . Frequency of Social Gatherings with Friends and Family: More than three  times a week  . Attends Religious Services: More than 4 times per year  . Active Member of Clubs or Organizations: Yes  . Attends Archivist Meetings: More than 4 times per year  . Marital Status: Never married  Intimate Partner Violence: Not At Risk  . Fear of Current or Ex-Partner: No  . Emotionally Abused: No  . Physically Abused: No  . Sexually Abused: No     OBSERVATIONS/OBJECTIVE:  BP (!) 149/75 (BP Location: Left Arm, Patient Position: Sitting)   Pulse 95   Temp 98.6 F (37 C) (Tympanic)   Resp 19   Ht 5' 6" (1.676 m)   Wt 161 lb 4.8 oz (73.2 kg)   SpO2 98%   BMI 26.03 kg/m  GENERAL: Patient is a well appearing female in no acute distress HEENT:  Sclerae anicteric.  Oropharynx clear and moist. No ulcerations or evidence of oropharyngeal candidiasis. Neck is supple.  NODES:  No cervical, supraclavicular, or axillary lymphadenopathy palpated.  BREAST EXAM:  Deferred. LUNGS:  Clear to auscultation bilaterally.  No wheezes or rhonchi. HEART:  Regular rate and rhythm. No murmur appreciated. ABDOMEN:  Soft, nontender.  Positive, normoactive bowel sounds. No organomegaly palpated. MSK:  No focal spinal tenderness to palpation. Full range of motion bilaterally in the upper extremities. EXTREMITIES:  No peripheral edema.   SKIN:  Clear with no obvious rashes or skin changes. No nail dyscrasia. NEURO:  Nonfocal. Well oriented.  Appropriate affect.    LABORATORY DATA:  None for this visit.  DIAGNOSTIC IMAGING:  None for this visit.      ASSESSMENT AND PLAN:  Ms.. Christine Cantrell is a pleasant 74 y.o. female with Stage IA right breast invasive ductal carcinoma, ER+/PR+/HER2-, diagnosed in 06/2020, treated with lumpectomy and anti-estrogen therapy with Anastrozole beginning in 08/2020.  She presents to the Survivorship Clinic for our initial meeting and routine follow-up post-completion of treatment for breast cancer.    1. Stage IA right breast cancer:  Ms. Nova is  continuing to recover from definitive treatment for breast cancer. She will follow-up with her medical oncologist, Dr. Lindi Adie in 6 months with history and physical exam per surveillance protocol.  She will continue her anti-estrogen therapy with Anastrozole. Thus far, she is tolerating the Anastrozole well, with minimal side effects. She was instructed to make Dr.  Gudena or myself aware if she begins to experience any worsening side effects of the medication and I could see her back in clinic to help manage those side effects, as needed. Her mammogram is due 05/2021; orders placed today.    Today, a comprehensive survivorship care plan and treatment summary was reviewed with the patient today detailing her breast cancer diagnosis, treatment course, potential late/long-term effects of treatment, appropriate follow-up care with recommendations for the future, and patient education resources.  A copy of this summary, along with a letter will be sent to the patient's primary care provider via mail/fax/In Basket message after today's visit.    2. Bone health:  Given Ms. Gilles's age/history of breast cancer and her current treatment regimen including anti-estrogen therapy with Anastrozole, she is at risk for bone demineralization.  Her last DEXA scan was 08/2020 and was normal.  She will be due for a repeat bone density in 08/2022.  In the meantime, she was encouraged to increase her consumption of foods rich in calcium, as well as increase her weight-bearing activities.  She was given education on specific activities to promote bone health.  3. Cancer screening:  Due to Ms. Slinger's history and her age, she should receive screening for skin cancers, colon cancer, and gynecologic cancers.  The information and recommendations are listed on the patient's comprehensive care plan/treatment summary and were reviewed in detail with the patient.    4. Health maintenance and wellness promotion: Ms. Mcmackin was encouraged  to consume 5-7 servings of fruits and vegetables per day. We reviewed the "Nutrition Rainbow" handout, as well as the handout "Take Control of Your Health and Reduce Your Cancer Risk" from the [employer].  She was also encouraged to engage in moderate to vigorous exercise for 30 minutes per day most days of the week. We discussed the [employer] fitness program, which is designed for cancer survivors to help them become more physically fit after cancer treatments.  She was instructed to limit her alcohol consumption and continue to abstain from tobacco use.     5. Support services/counseling: It is not uncommon for this period of the patient's cancer care trajectory to be one of many emotions and stressors.  We discussed how this can be increasingly difficult during the times of quarantine and social distancing due to the COVID-19 pandemic.   She was given information regarding our available services and encouraged to contact me with any questions or for help enrolling in any of our support group/programs.    Follow up instructions:    -Return to cancer center in 6 months for f/u with Dr. Lindi Adie  -Mammogram due in 05/2021 -Bone density testing due in 08/2022 -Follow up with surgery 1 year -She is welcome to return back to the Survivorship Clinic at any time; no additional follow-up needed at this time.  -Consider referral back to survivorship as a long-term survivor for continued surveillance  The patient was provided an opportunity to ask questions and all were answered. The patient agreed with the plan and demonstrated an understanding of the instructions.   Total encounter time: 30 minutes*  Wilber Bihari, NP 11/30/20 1:36 PM Medical Oncology and Hematology [facility] [place], [place] [address] Tel. [phone]    Fax. [phone]  *Total Encounter Time as defined by the Centers for Medicare and Medicaid Services includes, in addition to the  face-to-face time of a patient visit (documented in the note above) non-face-to-face time: obtaining  and reviewing outside history, ordering and reviewing medications, tests or procedures, care coordination (communications with other health care professionals or caregivers) and documentation in the medical record.

## 2020-11-30 NOTE — Telephone Encounter (Signed)
Attempted to call Christine Cantrell this morning regarding appt she has today at 3:30. Wilber Bihari, NP is asking if Christine Cantrell can be moved to an earlier appt in the day or change to virtual. Christine Cantrell's cell phone VM box is full so I LVM on home phone number.

## 2020-12-04 ENCOUNTER — Telehealth: Payer: Self-pay | Admitting: Adult Health

## 2020-12-04 NOTE — Telephone Encounter (Signed)
Scheduled appts per 12/3 los. Pt's voicemail box was full. Will mail appt reminder and calendar on 12/9.

## 2020-12-19 ENCOUNTER — Encounter: Payer: Self-pay | Admitting: Family Medicine

## 2020-12-19 ENCOUNTER — Other Ambulatory Visit: Payer: Self-pay

## 2020-12-19 ENCOUNTER — Ambulatory Visit (INDEPENDENT_AMBULATORY_CARE_PROVIDER_SITE_OTHER): Payer: Medicare Other | Admitting: Family Medicine

## 2020-12-19 VITALS — BP 141/82 | HR 85 | Temp 98.3°F | Ht 66.0 in | Wt 159.8 lb

## 2020-12-19 DIAGNOSIS — C50211 Malignant neoplasm of upper-inner quadrant of right female breast: Secondary | ICD-10-CM | POA: Diagnosis not present

## 2020-12-19 DIAGNOSIS — E890 Postprocedural hypothyroidism: Secondary | ICD-10-CM

## 2020-12-19 DIAGNOSIS — I10 Essential (primary) hypertension: Secondary | ICD-10-CM

## 2020-12-19 DIAGNOSIS — E782 Mixed hyperlipidemia: Secondary | ICD-10-CM

## 2020-12-19 DIAGNOSIS — Z170 Estrogen receptor positive status [ER+]: Secondary | ICD-10-CM | POA: Diagnosis not present

## 2020-12-19 NOTE — Progress Notes (Signed)
Subjective: CC: Follow-up hypothyroidism PCP: Janora Norlander, DO ZSM:OLMBE Locurto is a 74 y.o. female presenting to clinic today for:  1.  Hypothyroidism Patient is compliant with Synthroid 75 mcg daily.  No tremor, heart palpitations, change in bowel habit or weight.  2.  Hyperlipidemia/hypertension Patient noted to have elevated triglycerides on her lipid panel in June.  At that time she had just refilled her Zocor and did not want to switch cholesterol medication.  She is not fasting today but would be willing to come back in tomorrow to have this checked.  She is compliant with Zocor, lisinopril and hydrochlorothiazide.  No chest pain, shortness of breath or edema.  No visual disturbance.  No dizziness  3.  Breast cancer Patient diagnosed with right-sided breast cancer and is now status post lumpectomy.  She is being monitored closely by oncology and is compliant with her Arimidex.  No concerns from that standpoint but she does report increased stress secondary to the diagnosis and surgery.  ROS: Per HPI  No Known Allergies Past Medical History:  Diagnosis Date  . Abnormal EKG saw dr hochrein 3 yrs ago for   hx of left bundle branch block on ekg since 2002  . Breast cancer ([place])   . Depression   . Diverticulosis large intestine w/o perforation or abscess w/bleeding 2016  . Family history of lung cancer   . Family history of pancreatic cancer   . Family history of prostate cancer   . GERD (gastroesophageal reflux disease)    hx of  . High cholesterol   . History of hiatal hernia   . Hx of colonic polyps 2016   adenomatous  . Hypertension   . Hypothyroidism   . Pancreatic cyst   . Thyroid disease    hypothyroidism    Current Outpatient Medications:  .  anastrozole (ARIMIDEX) 1 MG tablet, Take 1 tablet (1 mg total) by mouth daily., Disp: 90 tablet, Rfl: 3 .  Coenzyme Q10-Vitamin E (QUNOL ULTRA COQ10 PO), Take 1 capsule by mouth daily after lunch. , Disp: , Rfl:   .  Krill Oil 500 MG CAPS, Take 500 mg by mouth daily after lunch. , Disp: , Rfl:  .  lisinopril (ZESTRIL) 10 MG tablet, Take 1 tablet (10 mg total) by mouth daily., Disp: 90 tablet, Rfl: 3 .  lisinopril-hydrochlorothiazide (ZESTORETIC) 20-12.5 MG tablet, Take 1 tablet by mouth daily., Disp: 90 tablet, Rfl: 3 .  Magnesium 250 MG TABS, Take 250 mg by mouth daily after lunch. , Disp: , Rfl:  .  Multiple Vitamin (MULTIVITAMIN WITH MINERALS) TABS tablet, Take 1 tablet by mouth daily after lunch. Women's One A Day Multivitamin , Disp: , Rfl:  .  Polyethyl Glycol-Propyl Glycol 0.4-0.3 % SOLN, Place 1 drop into both eyes 3 (three) times daily as needed (dry eyes.)., Disp: , Rfl:  .  simvastatin (ZOCOR) 20 MG tablet, Take 1 tablet (20 mg total) by mouth daily., Disp: 90 tablet, Rfl: 3 .  SYNTHROID 75 MCG tablet, TAKE 1 TABLET (75 MCG TOTAL) BY MOUTH DAILY BEFORE BREAKFAST., Disp: 30 tablet, Rfl: 3 Social History   Socioeconomic History  . Marital status: Single    Spouse name: Not on file  . Number of children: 0  . Years of education: 16  . Highest education level: Bachelor's degree (e.g., BA, AB, BS)  Occupational History  . Occupation: retired    Comment: Therapist, nutritional  Tobacco Use  . Smoking status: Never Smoker  . Smokeless  tobacco: Never Used  Vaping Use  . Vaping Use: Never used  Substance and Sexual Activity  . Alcohol use: Yes    Alcohol/week: 1.0 standard drink    Types: 1 Glasses of wine per week    Comment: occasionally  . Drug use: Never  . Sexual activity: Not Currently    Partners: Male    Birth control/protection: Surgical  Other Topics Concern  . Not on file  Social History Narrative   Lives alone.  No children   Social Determinants of Health   Financial Resource Strain: Low Risk   . Difficulty of Paying Living Expenses: Not hard at all  Food Insecurity: No Food Insecurity  . Worried About Charity fundraiser in the Last Year: Never true  . Ran Out of Food  in the Last Year: Never true  Transportation Needs: No Transportation Needs  . Lack of Transportation (Medical): No  . Lack of Transportation (Non-Medical): No  Physical Activity: Sufficiently Active  . Days of Exercise per Week: 5 days  . Minutes of Exercise per Session: 40 min  Stress: No Stress Concern Present  . Feeling of Stress : Not at all  Social Connections: Moderately Integrated  . Frequency of Communication with Friends and Family: More than three times a week  . Frequency of Social Gatherings with Friends and Family: More than three times a week  . Attends Religious Services: More than 4 times per year  . Active Member of Clubs or Organizations: Yes  . Attends Archivist Meetings: More than 4 times per year  . Marital Status: Never married  Intimate Partner Violence: Not At Risk  . Fear of Current or Ex-Partner: No  . Emotionally Abused: No  . Physically Abused: No  . Sexually Abused: No   Family History  Problem Relation Age of Onset  . High Cholesterol Mother   . Hypertension Mother   . Goiter Mother   . High Cholesterol Father   . Hypertension Father   . Hypertension Brother   . Lung disease Brother        chronic smoker  . Heart disease Brother   . Goiter Sister   . Heart disease Brother   . Hypertension Brother   . Prostate cancer Brother 65  . Lung cancer Maternal Uncle        smoker  . Pancreatic cancer Nephew        dx. 40s/50s  . Cancer Nephew        pancreatic vs. multiple myeloma; dx. 40s/50s  . Colon cancer Neg Hx   . Stomach cancer Neg Hx     Objective: Office vital signs reviewed. BP (!) 141/82   Pulse 85   Temp 98.3 F (36.8 C)   Ht 5' 6"  (1.676 m)   Wt 159 lb 12.8 oz (72.5 kg)   SpO2 96%   BMI 25.79 kg/m   Physical Examination:  General: Awake, alert, well nourished, No acute distress HEENT: Normal; sclera white.  No palpable thyroid masses.  No goiter.  No exophthalmos Cardio: regular rate and rhythm, S1S2 heard, no  murmurs appreciated Pulm: clear to auscultation bilaterally, no wheezes, rhonchi or rales; normal work of breathing on room air Extremities: warm, well perfused, No edema, cyanosis or clubbing; +2 pulses bilaterally Skin: dry; intact; no rashes or lesions; normal temperature Neuro: No tremor  Assessment/ Plan: 74 y.o. female   Postoperative hypothyroidism - Plan: Thyroid Panel With TSH, CANCELED: Thyroid Panel With TSH, CANCELED: Thyroid  Panel With TSH  Mixed hyperlipidemia - Plan: Lipid Panel, CANCELED: Lipid Panel  Essential hypertension  Malignant neoplasm of upper-inner quadrant of right breast in female, estrogen receptor positive ([place])  Patient is asymptomatic from a thyroid standpoint.  Last thyroid panel showed a suppressed TSH but thyroid level was normal.  She will come in tomorrow for fasting labs and we will collect that panel again tomorrow and CC to Dr. Loanne Drilling  Fasting labs previously showed elevated triglycerides.  She did not want to switch from Zocor at that time but is willing to make a switch if needed this [place].  She will come in for fasting labs tomorrow.  Recent renal function and CBC obtained in August of this was not repeated today.  Blood pressures well controlled.  Continue current regimen  Continue to follow-up with oncology repeat breast cancer surveillance and management of Arimidex.  We can follow-up in 6 months, sooner if she needs me   Orders Placed This Encounter  Procedures  . Lipid Panel  . Thyroid Panel With TSH   No orders of the defined types were placed in this encounter.    Janora Norlander, DO [place] [phone]

## 2020-12-20 ENCOUNTER — Other Ambulatory Visit: Payer: Self-pay

## 2020-12-20 ENCOUNTER — Other Ambulatory Visit: Payer: Medicare Other

## 2020-12-20 DIAGNOSIS — E782 Mixed hyperlipidemia: Secondary | ICD-10-CM | POA: Diagnosis not present

## 2020-12-20 DIAGNOSIS — E890 Postprocedural hypothyroidism: Secondary | ICD-10-CM | POA: Diagnosis not present

## 2020-12-21 LAB — LIPID PANEL
Chol/HDL Ratio: 1.6 ratio (ref 0.0–4.4)
Cholesterol, Total: 204 mg/dL — ABNORMAL HIGH (ref 100–199)
HDL: 126 mg/dL (ref >39)
LDL Chol Calc (NIH): 67 mg/dL (ref 0–99)
Triglycerides: 58 mg/dL (ref 0–149)
VLDL Cholesterol Cal: 11 mg/dL (ref 5–40)

## 2020-12-21 LAB — THYROID PANEL WITH TSH
Free Thyroxine Index: 2.6 (ref 1.2–4.9)
T3 Uptake Ratio: 32 % (ref 24–39)
T4, Total: 8.2 ug/dL (ref 4.5–12.0)
TSH: 0.483 u[IU]/mL (ref 0.450–4.500)

## 2020-12-28 ENCOUNTER — Other Ambulatory Visit: Payer: Self-pay | Admitting: Family Medicine

## 2021-03-05 DIAGNOSIS — Z170 Estrogen receptor positive status [ER+]: Secondary | ICD-10-CM | POA: Diagnosis not present

## 2021-03-05 DIAGNOSIS — C50212 Malignant neoplasm of upper-inner quadrant of left female breast: Secondary | ICD-10-CM | POA: Diagnosis not present

## 2021-05-04 ENCOUNTER — Other Ambulatory Visit: Payer: Self-pay | Admitting: Family Medicine

## 2021-05-04 DIAGNOSIS — E782 Mixed hyperlipidemia: Secondary | ICD-10-CM

## 2021-05-23 ENCOUNTER — Other Ambulatory Visit: Payer: Self-pay | Admitting: Family Medicine

## 2021-06-02 NOTE — Progress Notes (Signed)
Patient Care Team: Janora Norlander, DO as PCP - General (Family Medicine) Stark Klein, MD as Consulting Physician (General Surgery) Nicholas Lose, MD as Consulting Physician (Hematology and Oncology) Gery Pray, MD as Consulting Physician (Radiation Oncology)  DIAGNOSIS:    ICD-10-CM   1. Malignant neoplasm of upper-inner quadrant of right breast in female, estrogen receptor positive ([place])  C50.211    Z17.0     SUMMARY OF ONCOLOGIC HISTORY: Oncology History  Malignant neoplasm of upper-inner quadrant of right breast in female, estrogen receptor positive ([place])  07/06/2020 Initial Diagnosis   Screening mammogram detected a 0.8cm right breast mass. Diagnostic mammogram showed the mass measuring 0.7cm, with no right axillary adenopathy. 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   07/11/2020 Cancer Staging   Staging form: Breast, AJCC 8th Edition - Clinical stage from 07/11/2020: Stage IA (cT1b, cN0, cM0, G2, ER+, PR+, HER2-)   08/22/2020 Surgery   Right lumpectomy Barry Dienes) ([id number]): IDC, 1.1cm, grade 2, clear margins. No regional lymph nodes were examined.   08/2020 - 08/2025 Anti-estrogen oral therapy   Anatrozole   11/04/2020 Cancer Staging   Staging form: Breast, AJCC 8th Edition - Pathologic: Stage IA (pT1c, pN0, cM0, G2, ER+, PR+, HER2-)     CHIEF COMPLIANT: Follow-up of right breast cancer on anastrozole   INTERVAL HISTORY: Christine Cantrell is a 75 y.o. with above-mentioned history of right breast cancer who underwent a right lumpectomy and is currently on antiestrogen therapy with anastrozole. She presents to the clinic today for follow-up.  She reports to be tolerating anastrozole extremely well without any problems or concerns.  Denies any lumps or nodules in the breast.  She is set up for a mammogram in July at [facility].  ALLERGIES:  has No Known Allergies.  MEDICATIONS:  Current Outpatient Medications  Medication Sig  Dispense Refill  . anastrozole (ARIMIDEX) 1 MG tablet Take 1 tablet (1 mg total) by mouth daily. 90 tablet 3  . Coenzyme Q10-Vitamin E (QUNOL ULTRA COQ10 PO) Take 1 capsule by mouth daily after lunch.     Javier Docker Oil 500 MG CAPS Take 500 mg by mouth daily after lunch.     . lisinopril (ZESTRIL) 10 MG tablet TAKE 1 TABLET BY MOUTH EVERY DAY 90 tablet 0  . lisinopril-hydrochlorothiazide (ZESTORETIC) 20-12.5 MG tablet TAKE 1 TABLET BY MOUTH  DAILY 90 tablet 0  . Magnesium 250 MG TABS Take 250 mg by mouth daily after lunch.     . Multiple Vitamin (MULTIVITAMIN WITH MINERALS) TABS tablet Take 1 tablet by mouth daily after lunch. Women's One A Day Multivitamin     . Polyethyl Glycol-Propyl Glycol 0.4-0.3 % SOLN Place 1 drop into both eyes 3 (three) times daily as needed (dry eyes.).    Marland Kitchen simvastatin (ZOCOR) 20 MG tablet TAKE 1 TABLET BY MOUTH  DAILY 90 tablet 0  . SYNTHROID 75 MCG tablet TAKE 1 TABLET (75 MCG TOTAL) BY MOUTH DAILY BEFORE BREAKFAST. 90 tablet 3   No current facility-administered medications for this visit.    PHYSICAL EXAMINATION: ECOG PERFORMANCE STATUS: 1 - Symptomatic but completely ambulatory  Vitals:   06/03/21 0959  BP: (!) 142/58  Pulse: 87  Resp: 18  Temp: 97.8 F (36.6 C)  SpO2: 98%   Filed Weights   06/03/21 0959  Weight: 160 lb 6.4 oz (72.8 kg)    BREAST: No palpable masses or nodules in either right or left breasts. No palpable axillary  supraclavicular or infraclavicular adenopathy no breast tenderness or nipple discharge. (exam performed in the presence of a chaperone)  LABORATORY DATA:  I have reviewed the data as listed CMP Latest Ref Rng & Units 08/16/2020 07/11/2020 06/19/2020  Glucose 70 - 99 mg/dL 114(H) 198(H) 129(H)  BUN 8 - 23 mg/dL 15 14 9   Creatinine 0.44 - 1.00 mg/dL 0.63 0.85 0.68  Sodium 135 - 145 mmol/L 129(L) 132(L) 134  Potassium 3.5 - 5.1 mmol/L 4.2 3.9 4.5  Chloride 98 - 111 mmol/L 97(L) 95(L) 97  CO2 22 - 32 mmol/L 23 25 22   Calcium  8.9 - 10.3 mg/dL 9.1 9.8 9.8  Total Protein 6.5 - 8.1 g/dL - 7.7 6.8  Total Bilirubin 0.3 - 1.2 mg/dL - 1.6(H) 0.6  Alkaline Phos 38 - 126 U/L - 32(L) 36(L)  AST 15 - 41 U/L - 39 38  ALT 0 - 44 U/L - 40 33(H)    Lab Results  Component Value Date   WBC 5.2 08/16/2020   HGB 13.6 08/16/2020   HCT 39.4 08/16/2020   MCV 93.4 08/16/2020   PLT 208 08/16/2020   NEUTROABS 3.9 07/11/2020    ASSESSMENT & PLAN:  Malignant neoplasm of upper-inner quadrant of right breast in female, estrogen receptor positive ([place]) 07/06/2020:Screening mammogram detected a 0.8cm right breast mass. Diagnostic mammogram showed the mass measuring 0.7cm, with no right axillary adenopathy. 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 T1BN0 stage Ia  08/22/2020:Right lumpectomy ([place]): IDC, 1.1cm, grade 2, clear margins.  ER 95%, PR 95%, Ki-67 30%, IHC equivocal, FISH negative Refused radiation therapy.  Treatment plan: adjuvant antiestrogen therapy with anastrozole 1 mg daily x5 years started 09/04/2020  Anastrozole Toxicities: Denies any hot flashes or arthralgias or myalgias.  Breast Cancer Surveillance: 1. Breast Exam 06/02/21: Benign 2. Mammogram: Scheduled to be done in July at [facility]  Return to clinic in 1 year for follow-up   No orders of the defined types were placed in this encounter.  The patient has a good understanding of the overall plan. she agrees with it. she will call with any problems that may develop before the next visit here.  Total time spent: 30 mins including face to face time and time spent for planning, charting and coordination of care  Rulon Eisenmenger, MD, MPH 06/03/2021  I, Cloyde Reams Dorshimer, am acting as scribe for Dr. Nicholas Lose.  I have reviewed the above documentation for accuracy and completeness, and I agree with the above.

## 2021-06-02 NOTE — Assessment & Plan Note (Signed)
07/06/2020:Screening mammogram detected a 0.8cm right breast mass. Diagnostic mammogram showed the mass measuring 0.7cm, with no right axillary adenopathy. 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 T1BN0 stage Ia  08/22/2020:Right lumpectomy ([place]): IDC, 1.1cm, grade 2, clear margins.  ER 95%, PR 95%, Ki-67 30%, IHC equivocal, FISH negative  Treatment plan: adjuvant antiestrogen therapy with anastrozole 1 mg daily x5 years started 09/04/2020  Anastrozole Toxicities:  Breast Cancer Surveillance: 1. Breast Exam 06/02/21 2. Mammogram:

## 2021-06-03 ENCOUNTER — Other Ambulatory Visit: Payer: Self-pay

## 2021-06-03 ENCOUNTER — Inpatient Hospital Stay: Payer: Medicare Other | Attending: Hematology and Oncology | Admitting: Hematology and Oncology

## 2021-06-03 DIAGNOSIS — Z79899 Other long term (current) drug therapy: Secondary | ICD-10-CM | POA: Diagnosis not present

## 2021-06-03 DIAGNOSIS — Z923 Personal history of irradiation: Secondary | ICD-10-CM | POA: Diagnosis not present

## 2021-06-03 DIAGNOSIS — Z170 Estrogen receptor positive status [ER+]: Secondary | ICD-10-CM | POA: Diagnosis not present

## 2021-06-03 DIAGNOSIS — Z79811 Long term (current) use of aromatase inhibitors: Secondary | ICD-10-CM | POA: Insufficient documentation

## 2021-06-03 DIAGNOSIS — C50211 Malignant neoplasm of upper-inner quadrant of right female breast: Secondary | ICD-10-CM | POA: Diagnosis not present

## 2021-06-03 MED ORDER — ANASTROZOLE 1 MG PO TABS: 1.0000 mg | ORAL_TABLET | Freq: Every day | ORAL | 3 refills | Status: DC

## 2021-06-04 ENCOUNTER — Telehealth: Payer: Self-pay | Admitting: Hematology and Oncology

## 2021-06-04 NOTE — Telephone Encounter (Signed)
Scheduled per 6/6 los. Called pt and unable to leave a msg

## 2021-06-21 ENCOUNTER — Encounter: Payer: Medicare Other | Admitting: Family Medicine

## 2021-06-24 DIAGNOSIS — Z853 Personal history of malignant neoplasm of breast: Secondary | ICD-10-CM | POA: Diagnosis not present

## 2021-07-29 ENCOUNTER — Other Ambulatory Visit: Payer: Self-pay | Admitting: Family Medicine

## 2021-07-30 ENCOUNTER — Telehealth: Payer: Self-pay | Admitting: Family Medicine

## 2021-07-30 ENCOUNTER — Encounter: Payer: Self-pay | Admitting: Family Medicine

## 2021-07-30 ENCOUNTER — Other Ambulatory Visit: Payer: Self-pay

## 2021-07-30 ENCOUNTER — Ambulatory Visit (INDEPENDENT_AMBULATORY_CARE_PROVIDER_SITE_OTHER): Payer: Medicare Other | Admitting: Family Medicine

## 2021-07-30 VITALS — BP 120/71 | HR 105 | Temp 98.1°F | Ht 66.0 in | Wt 153.8 lb

## 2021-07-30 DIAGNOSIS — E890 Postprocedural hypothyroidism: Secondary | ICD-10-CM | POA: Diagnosis not present

## 2021-07-30 DIAGNOSIS — Z0001 Encounter for general adult medical examination with abnormal findings: Secondary | ICD-10-CM | POA: Diagnosis not present

## 2021-07-30 DIAGNOSIS — Z170 Estrogen receptor positive status [ER+]: Secondary | ICD-10-CM

## 2021-07-30 DIAGNOSIS — E782 Mixed hyperlipidemia: Secondary | ICD-10-CM

## 2021-07-30 DIAGNOSIS — I10 Essential (primary) hypertension: Secondary | ICD-10-CM

## 2021-07-30 DIAGNOSIS — C50211 Malignant neoplasm of upper-inner quadrant of right female breast: Secondary | ICD-10-CM

## 2021-07-30 DIAGNOSIS — Z0000 Encounter for general adult medical examination without abnormal findings: Secondary | ICD-10-CM

## 2021-07-30 MED ORDER — SIMVASTATIN 20 MG PO TABS: 20.0000 mg | ORAL_TABLET | Freq: Every day | ORAL | 3 refills | Status: DC

## 2021-07-30 MED ORDER — LISINOPRIL-HYDROCHLOROTHIAZIDE 20-12.5 MG PO TABS: 1.0000 | ORAL_TABLET | Freq: Every day | ORAL | 3 refills | Status: DC

## 2021-07-30 MED ORDER — LISINOPRIL 10 MG PO TABS: 10.0000 mg | ORAL_TABLET | Freq: Every day | ORAL | 3 refills | Status: DC

## 2021-07-30 NOTE — Telephone Encounter (Signed)
pt aware 

## 2021-07-30 NOTE — Progress Notes (Signed)
Christine Cantrell is a 75 y.o. female presents to office today for annual physical exam examination.    Concerns today include: 1. none  Occupation: retired  Diet: balanced. Walks daily up to 4 miles with her walking partner Last eye exam: UTD Last dental exam: UTD Last colonoscopy: UTD Last mammogram: UTD; Dr Lindi Adie does yearly breast exam. Continued on Arimidex (sometimes makes her woozy so she takes at bedtime) Last pap smear: n/a Refills needed today: none Immunizations needed:  Immunization History  Administered Date(s) Administered   Fluad Quad(high Dose 65+) 08/27/2019, 08/27/2019   Influenza, High Dose Seasonal PF 09/07/2018   Influenza-Unspecified 09/26/2015, 09/17/2016, 09/25/2017, 09/01/2020   PFIZER(Purple Top)SARS-COV-2 Vaccination 01/20/2020, 02/10/2020, 08/28/2020, 03/30/2021   Pneumococcal Polysaccharide-23 09/25/2017   Zoster Recombinat (Shingrix) 06/10/2017, 09/03/2017     Past Medical History:  Diagnosis Date   Abnormal EKG saw dr hochrein 3 yrs ago for   hx of left bundle branch block on ekg since 2002   Breast cancer ([place])    Depression    Diverticulosis large intestine w/o perforation or abscess w/bleeding 2016   Family history of lung cancer    Family history of pancreatic cancer    Family history of prostate cancer    GERD (gastroesophageal reflux disease)    hx of   High cholesterol    History of hiatal hernia    Hx of colonic polyps 2016   adenomatous   Hypertension    Hypothyroidism    Pancreatic cyst    Thyroid disease    hypothyroidism   Social History   Socioeconomic History   Marital status: Single    Spouse name: Not on file   Number of children: 0   Years of education: 16   Highest education level: Bachelor's degree (e.g., BA, AB, BS)  Occupational History   Occupation: retired    Comment: Therapist, nutritional  Tobacco Use   Smoking status: Never   Smokeless tobacco: Never  Vaping Use   Vaping Use: Never used  Substance  and Sexual Activity   Alcohol use: Yes    Alcohol/week: 1.0 standard drink    Types: 1 Glasses of wine per week    Comment: occasionally   Drug use: Never   Sexual activity: Not Currently    Partners: Male    Birth control/protection: Surgical  Other Topics Concern   Not on file  Social History Narrative   Lives alone.  No children   Social Determinants of Health   Financial Resource Strain: Not on file  Food Insecurity: Not on file  Transportation Needs: Not on file  Physical Activity: Not on file  Stress: Not on file  Social Connections: Not on file  Intimate Partner Violence: Not on file   Past Surgical History:  Procedure Laterality Date   ABDOMINAL HYSTERECTOMY  2002   complete for plyps   BREAST LUMPECTOMY WITH RADIOACTIVE SEED LOCALIZATION Right 08/22/2020   Procedure: RIGHT BREAST LUMPECTOMY WITH RADIOACTIVE SEED LOCALIZATION;  Surgeon: Stark Klein, MD;  Location: [facility];  Service: General;  Laterality: Right;   ESOPHAGOGASTRODUODENOSCOPY N/A 09/30/2018   Procedure: ESOPHAGOGASTRODUODENOSCOPY (EGD);  Surgeon: Milus Banister, MD;  Location: [facility];  Service: Endoscopy;  Laterality: N/A;   EUS N/A 09/30/2018   Procedure: UPPER ENDOSCOPIC ULTRASOUND (EUS) RADIAL;  Surgeon: Milus Banister, MD;  Location: [facility];  Service: Endoscopy;  Laterality: N/A;   LIVER BIOPSY     normal result   thryoid needle biopsy  1990's   THYROIDECTOMY  1982   Family History  Problem Relation Age of Onset   High Cholesterol Mother    Hypertension Mother    Goiter Mother    High Cholesterol Father    Hypertension Father    Hypertension Brother    Lung disease Brother        chronic smoker   Heart disease Brother    Goiter Sister    Heart disease Brother    Hypertension Brother    Prostate cancer Brother 62   Lung cancer Maternal Uncle        smoker   Pancreatic cancer Nephew        dx. 40s/50s   Cancer Nephew        pancreatic vs. multiple myeloma; dx. 40s/50s    Colon cancer Neg Hx    Stomach cancer Neg Hx     Current Outpatient Medications:    anastrozole (ARIMIDEX) 1 MG tablet, Take 1 tablet (1 mg total) by mouth daily., Disp: 90 tablet, Rfl: 3   Coenzyme Q10-Vitamin E (QUNOL ULTRA COQ10 PO), Take 1 capsule by mouth daily after lunch. , Disp: , Rfl:    Krill Oil 500 MG CAPS, Take 500 mg by mouth daily after lunch. , Disp: , Rfl:    lisinopril (ZESTRIL) 10 MG tablet, TAKE 1 TABLET BY MOUTH EVERY DAY, Disp: 90 tablet, Rfl: 0   lisinopril-hydrochlorothiazide (ZESTORETIC) 20-12.5 MG tablet, TAKE 1 TABLET BY MOUTH  DAILY, Disp: 90 tablet, Rfl: 0   Magnesium 250 MG TABS, Take 250 mg by mouth daily after lunch. , Disp: , Rfl:    Multiple Vitamin (MULTIVITAMIN WITH MINERALS) TABS tablet, Take 1 tablet by mouth daily after lunch. Women's One A Day Multivitamin , Disp: , Rfl:    Polyethyl Glycol-Propyl Glycol 0.4-0.3 % SOLN, Place 1 drop into both eyes 3 (three) times daily as needed (dry eyes.)., Disp: , Rfl:    simvastatin (ZOCOR) 20 MG tablet, TAKE 1 TABLET BY MOUTH  DAILY, Disp: 90 tablet, Rfl: 0   SYNTHROID 75 MCG tablet, TAKE 1 TABLET (75 MCG TOTAL) BY MOUTH DAILY BEFORE BREAKFAST., Disp: 90 tablet, Rfl: 3  No Known Allergies   ROS: Review of Systems Pertinent items noted in HPI and remainder of comprehensive ROS otherwise negative.    Physical exam BP 120/71   Pulse (!) 105   Temp 98.1 F (36.7 C)   Ht 5' 6"  (1.676 m)   Wt 153 lb 12.8 oz (69.8 kg)   SpO2 (!) 9%   BMI 24.82 kg/m  General appearance: alert, cooperative, appears stated age, and no distress Head: Normocephalic, without obvious abnormality, atraumatic Eyes: negative findings: lids and lashes normal, conjunctivae and sclerae normal, corneas clear, and pupils equal, round, reactive to light and accomodation Ears: normal TM's and external ear canals both ears Nose: Nares normal. Septum midline. Mucosa normal. No drainage or sinus tenderness. Throat: lips, mucosa, and tongue  normal; teeth and gums normal Neck: no adenopathy, no carotid bruit, supple, symmetrical, trachea midline, and thyroid not enlarged, symmetric, no tenderness/mass/nodules Back: symmetric, no curvature. ROM normal. No CVA tenderness. Lungs: clear to auscultation bilaterally Heart: regular rate and rhythm, S1, S2 normal, no murmur, click, rub or gallop Abdomen: soft, non-tender; bowel sounds normal; no masses,  no organomegaly Extremities: extremities normal, atraumatic, no cyanosis or edema Pulses: 2+ and symmetric Skin: Skin color, texture, turgor normal. No rashes or lesions Lymph nodes: Cervical, supraclavicular, and axillary nodes normal. Neurologic: Alert and oriented X 3, normal strength and tone.  Normal symmetric reflexes. Normal coordination and gait Psych: mood stable Depression screen [facility] 2/9 07/30/2021 12/19/2020 10/05/2020  Decreased Interest 0 0 0  Down, Depressed, Hopeless 0 0 0  PHQ - 2 Score 0 0 0  Altered sleeping - - -  Tired, decreased energy - - -  Change in appetite - - -  Feeling bad or failure about yourself  - - -  Trouble concentrating - - -  Moving slowly or fidgety/restless - - -  Suicidal thoughts - - -  PHQ-9 Score - - -     Assessment/ Plan: Sheran Lawless here for annual physical exam.   Annual physical exam  Postoperative hypothyroidism - Plan: CMP14+EGFR, TSH, T4, free  Mixed hyperlipidemia - Plan: CMP14+EGFR, Lipid panel, simvastatin (ZOCOR) 20 MG tablet  Essential hypertension - Plan: CMP14+EGFR, lisinopril (ZESTRIL) 10 MG tablet, lisinopril-hydrochlorothiazide (ZESTORETIC) 20-12.5 MG tablet  Malignant neoplasm of upper-inner quadrant of right breast in female, estrogen receptor positive ([place]) - Plan: CMP14+EGFR, CBC  Asymptomatic from a thyroid standpoint, future labs placed to be collected with fasting labs.  Continue zocor, lipid ordered  BP controlled. No changes.  Meds refilled  Continued surveillance for breast cancer. Continue  arimedex.  Jennamarie Goings M. Lajuana Ripple, DO

## 2021-07-30 NOTE — Patient Instructions (Addendum)
Come in for fasting labs at your convenience  Plan to have flu vaccine in fall but you can see me in 6 months for thyroid recheck  Preventive Care 77 Years and Older, Female Preventive care refers to lifestyle choices and visits with your health care provider that can promote health and wellness. This includes: A yearly physical exam. This is also called an annual wellness visit. Regular dental and eye exams. Immunizations. Screening for certain conditions. Healthy lifestyle choices, such as: Eating a healthy diet. Getting regular exercise. Not using drugs or products that contain nicotine and tobacco. Limiting alcohol use. What can I expect for my preventive care visit? Physical exam Your health care provider will check your: Height and weight. These may be used to calculate your BMI (body mass index). BMI is a measurement that tells if you are at a healthy weight. Heart rate and blood pressure. Body temperature. Skin for abnormal spots. Counseling Your health care provider may ask you questions about your: Past medical problems. Family's medical history. Alcohol, tobacco, and drug use. Emotional well-being. Home life and relationship well-being. Sexual activity. Diet, exercise, and sleep habits. History of falls. Memory and ability to understand (cognition). Work and work Statistician. Pregnancy and menstrual history. Access to firearms. What immunizations do I need?  Vaccines are usually given at various ages, according to a schedule. Your health care provider will recommend vaccines for you based on your age, medicalhistory, and lifestyle or other factors, such as travel or where you work. What tests do I need? Blood tests Lipid and cholesterol levels. These may be checked every 5 years, or more often depending on your overall health. Hepatitis C test. Hepatitis B test. Screening Lung cancer screening. You may have this screening every year starting at age 77 if you  have a 30-pack-year history of smoking and currently smoke or have quit within the past 15 years. Colorectal cancer screening. All adults should have this screening starting at age 77 and continuing until age 58. Your health care provider may recommend screening at age 77 if you are at increased risk. You will have tests every 1-10 years, depending on your results and the type of screening test. Diabetes screening. This is done by checking your blood sugar (glucose) after you have not eaten for a while (fasting). You may have this done every 1-3 years. Mammogram. This may be done every 1-2 years. Talk with your health care provider about how often you should have regular mammograms. Abdominal aortic aneurysm (AAA) screening. You may need this if you are a current or former smoker. BRCA-related cancer screening. This may be done if you have a family history of breast, ovarian, tubal, or peritoneal cancers. Other tests STD (sexually transmitted disease) testing, if you are at risk. Bone density scan. This is done to screen for osteoporosis. You may have this done starting at age 77. Talk with your health care provider about your test results, treatment options,and if necessary, the need for more tests. Follow these instructions at home: Eating and drinking  Eat a diet that includes fresh fruits and vegetables, whole grains, lean protein, and low-fat dairy products. Limit your intake of foods with high amounts of sugar, saturated fats, and salt. Take vitamin and mineral supplements as recommended by your health care provider. Do not drink alcohol if your health care provider tells you not to drink. If you drink alcohol: Limit how much you have to 0-1 drink a day. Be aware of how much alcohol is  in your drink. In the [place] one drink equals one 12 oz bottle of beer (355 mL), one 5 oz glass of wine (148 mL), or one 1 oz glass of hard liquor (44 mL).  Lifestyle Take daily care of your teeth and  gums. Brush your teeth every morning and night with fluoride toothpaste. Floss one time each day. Stay active. Exercise for at least 30 minutes 5 or more days each week. Do not use any products that contain nicotine or tobacco, such as cigarettes, e-cigarettes, and chewing tobacco. If you need help quitting, ask your health care provider. Do not use drugs. If you are sexually active, practice safe sex. Use a condom or other form of protection in order to prevent STIs (sexually transmitted infections). Talk with your health care provider about taking a low-dose aspirin or statin. Find healthy ways to cope with stress, such as: Meditation, yoga, or listening to music. Journaling. Talking to a trusted person. Spending time with friends and family. Safety Always wear your seat belt while driving or riding in a vehicle. Do not drive: If you have been drinking alcohol. Do not ride with someone who has been drinking. When you are tired or distracted. While texting. Wear a helmet and other protective equipment during sports activities. If you have firearms in your house, make sure you follow all gun safety procedures. What's next? Visit your health care provider once a year for an annual wellness visit. Ask your health care provider how often you should have your eyes and teeth checked. Stay up to date on all vaccines. This information is not intended to replace advice given to you by your health care provider. Make sure you discuss any questions you have with your healthcare provider. Document Revised: 12/05/2020 Document Reviewed: 12/09/2018 Elsevier Patient Education  2022 [employer]  

## 2021-07-31 ENCOUNTER — Other Ambulatory Visit: Payer: Medicare Other

## 2021-07-31 DIAGNOSIS — I10 Essential (primary) hypertension: Secondary | ICD-10-CM

## 2021-07-31 DIAGNOSIS — Z170 Estrogen receptor positive status [ER+]: Secondary | ICD-10-CM

## 2021-07-31 DIAGNOSIS — E890 Postprocedural hypothyroidism: Secondary | ICD-10-CM

## 2021-07-31 DIAGNOSIS — C50211 Malignant neoplasm of upper-inner quadrant of right female breast: Secondary | ICD-10-CM

## 2021-07-31 DIAGNOSIS — E782 Mixed hyperlipidemia: Secondary | ICD-10-CM

## 2021-08-01 LAB — CMP14+EGFR
ALT: 26 IU/L (ref 0–32)
AST: 30 IU/L (ref 0–40)
Albumin/Globulin Ratio: 2 (ref 1.2–2.2)
Albumin: 4.5 g/dL (ref 3.7–4.7)
Alkaline Phosphatase: 33 IU/L — ABNORMAL LOW (ref 44–121)
BUN/Creatinine Ratio: 19 (ref 12–28)
BUN: 13 mg/dL (ref 8–27)
Bilirubin Total: 0.9 mg/dL (ref 0.0–1.2)
CO2: 23 mmol/L (ref 20–29)
Calcium: 9.6 mg/dL (ref 8.7–10.3)
Chloride: 95 mmol/L — ABNORMAL LOW (ref 96–106)
Creatinine, Ser: 0.69 mg/dL (ref 0.57–1.00)
Globulin, Total: 2.3 g/dL (ref 1.5–4.5)
Glucose: 116 mg/dL — ABNORMAL HIGH (ref 65–99)
Potassium: 4.6 mmol/L (ref 3.5–5.2)
Sodium: 135 mmol/L (ref 134–144)
Total Protein: 6.8 g/dL (ref 6.0–8.5)
eGFR: 89 mL/min/{1.73_m2} (ref >59)

## 2021-08-01 LAB — T4, FREE: Free T4: 1.3 ng/dL (ref 0.82–1.77)

## 2021-08-01 LAB — LIPID PANEL
Chol/HDL Ratio: 2.5 ratio (ref 0.0–4.4)
Cholesterol, Total: 199 mg/dL (ref 100–199)
HDL: 80 mg/dL (ref >39)
LDL Chol Calc (NIH): 98 mg/dL (ref 0–99)
Triglycerides: 120 mg/dL (ref 0–149)
VLDL Cholesterol Cal: 21 mg/dL (ref 5–40)

## 2021-08-01 LAB — CBC
Hematocrit: 42.7 % (ref 34.0–46.6)
Hemoglobin: 14.8 g/dL (ref 11.1–15.9)
MCH: 32.9 pg (ref 26.6–33.0)
MCHC: 34.7 g/dL (ref 31.5–35.7)
MCV: 95 fL (ref 79–97)
Platelets: 157 10*3/uL (ref 150–450)
RBC: 4.5 x10E6/uL (ref 3.77–5.28)
RDW: 11.5 % — ABNORMAL LOW (ref 11.7–15.4)
WBC: 3.4 10*3/uL (ref 3.4–10.8)

## 2021-08-01 LAB — TSH: TSH: 0.44 u[IU]/mL — ABNORMAL LOW (ref 0.450–4.500)

## 2021-08-09 ENCOUNTER — Other Ambulatory Visit: Payer: Self-pay | Admitting: Family Medicine

## 2021-08-09 DIAGNOSIS — E782 Mixed hyperlipidemia: Secondary | ICD-10-CM

## 2021-08-09 DIAGNOSIS — I10 Essential (primary) hypertension: Secondary | ICD-10-CM

## 2021-08-20 DIAGNOSIS — L905 Scar conditions and fibrosis of skin: Secondary | ICD-10-CM | POA: Diagnosis not present

## 2021-08-20 DIAGNOSIS — L2084 Intrinsic (allergic) eczema: Secondary | ICD-10-CM | POA: Diagnosis not present

## 2021-08-20 DIAGNOSIS — Z85828 Personal history of other malignant neoplasm of skin: Secondary | ICD-10-CM | POA: Diagnosis not present

## 2021-08-20 DIAGNOSIS — D2272 Melanocytic nevi of left lower limb, including hip: Secondary | ICD-10-CM | POA: Diagnosis not present

## 2021-08-20 DIAGNOSIS — L570 Actinic keratosis: Secondary | ICD-10-CM | POA: Diagnosis not present

## 2021-08-20 DIAGNOSIS — D1801 Hemangioma of skin and subcutaneous tissue: Secondary | ICD-10-CM | POA: Diagnosis not present

## 2021-08-20 DIAGNOSIS — L814 Other melanin hyperpigmentation: Secondary | ICD-10-CM | POA: Diagnosis not present

## 2021-08-20 DIAGNOSIS — L821 Other seborrheic keratosis: Secondary | ICD-10-CM | POA: Diagnosis not present

## 2021-08-20 DIAGNOSIS — D485 Neoplasm of uncertain behavior of skin: Secondary | ICD-10-CM | POA: Diagnosis not present

## 2021-09-16 DIAGNOSIS — M25561 Pain in right knee: Secondary | ICD-10-CM | POA: Diagnosis not present

## 2021-09-17 ENCOUNTER — Ambulatory Visit (INDEPENDENT_AMBULATORY_CARE_PROVIDER_SITE_OTHER): Payer: Medicare Other | Admitting: Family Medicine

## 2021-09-17 ENCOUNTER — Encounter: Payer: Self-pay | Admitting: Family Medicine

## 2021-09-17 ENCOUNTER — Other Ambulatory Visit: Payer: Self-pay

## 2021-09-17 VITALS — BP 146/74 | HR 94 | Temp 98.1°F | Wt 151.0 lb

## 2021-09-17 DIAGNOSIS — R29898 Other symptoms and signs involving the musculoskeletal system: Secondary | ICD-10-CM | POA: Diagnosis not present

## 2021-09-17 NOTE — Progress Notes (Signed)
Subjective:  Patient ID: Christine Cantrell, female    DOB: [birth year], 75 y.o.   MRN: [id number]  Patient Care Team: Janora Norlander, DO as PCP - General (Family Medicine) Stark Klein, MD as Consulting Physician (General Surgery) Nicholas Lose, MD as Consulting Physician (Hematology and Oncology) Gery Pray, MD as Consulting Physician (Radiation Oncology)   Chief Complaint:  Gait Problem (Patient noticed that sometimes right leg does not want to move)   HPI: Christine Cantrell is a 75 y.o. female presenting on 09/17/2021 for Gait Problem (Patient noticed that sometimes right leg does not want to move)   Pt presents today for evaluation of right foot weakness. States this has been intermittent for the last 6-7 months. States when she is walking her right foot seems to not want to cooperate for a few seconds. States returns to normal quickly. No other associated symptoms. She denies loss of function, foot just does not want to lift when taking a step. No injury or pain.    Relevant past medical, surgical, family, and social history reviewed and updated as indicated.  Allergies and medications reviewed and updated. Data reviewed: Chart in Epic.   Past Medical History:  Diagnosis Date   Abnormal EKG saw dr hochrein 3 yrs ago for   hx of left bundle branch block on ekg since 2002   Breast cancer ([place])    Depression    Diverticulosis large intestine w/o perforation or abscess w/bleeding 2016   Family history of lung cancer    Family history of pancreatic cancer    Family history of prostate cancer    GERD (gastroesophageal reflux disease)    hx of   High cholesterol    History of hiatal hernia    Hx of colonic polyps 2016   adenomatous   Hypertension    Hypothyroidism    Pancreatic cyst    Thyroid disease    hypothyroidism    Past Surgical History:  Procedure Laterality Date   ABDOMINAL HYSTERECTOMY  2002   complete for plyps   BREAST LUMPECTOMY WITH RADIOACTIVE SEED  LOCALIZATION Right 08/22/2020   Procedure: RIGHT BREAST LUMPECTOMY WITH RADIOACTIVE SEED LOCALIZATION;  Surgeon: Stark Klein, MD;  Location: [facility];  Service: General;  Laterality: Right;   ESOPHAGOGASTRODUODENOSCOPY N/A 09/30/2018   Procedure: ESOPHAGOGASTRODUODENOSCOPY (EGD);  Surgeon: Milus Banister, MD;  Location: [facility];  Service: Endoscopy;  Laterality: N/A;   EUS N/A 09/30/2018   Procedure: UPPER ENDOSCOPIC ULTRASOUND (EUS) RADIAL;  Surgeon: Milus Banister, MD;  Location: [facility];  Service: Endoscopy;  Laterality: N/A;   LIVER BIOPSY     normal result   thryoid needle biopsy  1990's   THYROIDECTOMY  1982    Social History   Socioeconomic History   Marital status: Single    Spouse name: Not on file   Number of children: 0   Years of education: 16   Highest education level: Bachelor's degree (e.g., BA, AB, BS)  Occupational History   Occupation: retired    Comment: Therapist, nutritional  Tobacco Use   Smoking status: Never   Smokeless tobacco: Never  Vaping Use   Vaping Use: Never used  Substance and Sexual Activity   Alcohol use: Yes    Alcohol/week: 1.0 standard drink    Types: 1 Glasses of wine per week    Comment: occasionally   Drug use: Never   Sexual activity: Not Currently    Partners: Male    Birth control/protection: Surgical  Other Topics Concern   Not on file  Social History Narrative   Lives alone.  No children   Social Determinants of Health   Financial Resource Strain: Not on file  Food Insecurity: Not on file  Transportation Needs: Not on file  Physical Activity: Not on file  Stress: Not on file  Social Connections: Not on file  Intimate Partner Violence: Not on file    Outpatient Encounter Medications as of 09/17/2021  Medication Sig   anastrozole (ARIMIDEX) 1 MG tablet Take 1 tablet (1 mg total) by mouth daily.   Coenzyme Q10-Vitamin E (QUNOL ULTRA COQ10 PO) Take 1 capsule by mouth daily after lunch.    Krill Oil 500 MG CAPS  Take 500 mg by mouth daily after lunch.    lisinopril (ZESTRIL) 10 MG tablet Take 1 tablet (10 mg total) by mouth daily.   lisinopril-hydrochlorothiazide (ZESTORETIC) 20-12.5 MG tablet Take 1 tablet by mouth daily.   Magnesium 250 MG TABS Take 250 mg by mouth daily after lunch.    Multiple Vitamin (MULTIVITAMIN WITH MINERALS) TABS tablet Take 1 tablet by mouth daily after lunch. Women's One A Day Multivitamin    simvastatin (ZOCOR) 20 MG tablet Take 1 tablet (20 mg total) by mouth daily.   SYNTHROID 75 MCG tablet TAKE 1 TABLET (75 MCG TOTAL) BY MOUTH DAILY BEFORE BREAKFAST.   No facility-administered encounter medications on file as of 09/17/2021.    No Known Allergies  Review of Systems  Constitutional:  Negative for activity change, appetite change, chills, diaphoresis, fatigue, fever and unexpected weight change.  HENT: Negative.    Eyes: Negative.  Negative for photophobia and visual disturbance.  Respiratory:  Negative for cough, chest tightness and shortness of breath.   Cardiovascular:  Negative for chest pain, palpitations and leg swelling.  Gastrointestinal:  Negative for abdominal pain, blood in stool, constipation, diarrhea, nausea and vomiting.  Endocrine: Negative.   Genitourinary:  Negative for decreased urine volume, dysuria, frequency and urgency.  Musculoskeletal:  Positive for gait problem. Negative for arthralgias, back pain, joint swelling, myalgias, neck pain and neck stiffness.  Skin: Negative.   Allergic/Immunologic: Negative.   Neurological:  Positive for weakness (right foot, intermittent). Negative for dizziness, tremors, seizures, syncope, facial asymmetry, speech difficulty, light-headedness, numbness and headaches.  Hematological: Negative.   Psychiatric/Behavioral:  Negative for confusion, hallucinations, sleep disturbance and suicidal ideas.   All other systems reviewed and are negative.      Objective:  BP (!) 146/74   Pulse 94   Temp 98.1 F (36.7  C)   Wt 151 lb (68.5 kg)   SpO2 98%   BMI 24.37 kg/m    Wt Readings from Last 3 Encounters:  09/17/21 151 lb (68.5 kg)  07/30/21 153 lb 12.8 oz (69.8 kg)  06/03/21 160 lb 6.4 oz (72.8 kg)    Physical Exam Vitals and nursing note reviewed.  Constitutional:      General: She is not in acute distress.    Appearance: Normal appearance. She is well-developed, well-groomed and normal weight. She is not ill-appearing, toxic-appearing or diaphoretic.  HENT:     Head: Normocephalic and atraumatic.     Jaw: There is normal jaw occlusion.     Right Ear: Hearing normal.     Left Ear: Hearing normal.     Nose: Nose normal.     Mouth/Throat:     Lips: Pink.     Mouth: Mucous membranes are moist.     Pharynx: Oropharynx is clear.  Uvula midline.  Eyes:     General: Lids are normal.     Extraocular Movements: Extraocular movements intact.     Conjunctiva/sclera: Conjunctivae normal.     Pupils: Pupils are equal, round, and reactive to light.  Neck:     Thyroid: No thyroid mass, thyromegaly or thyroid tenderness.     Vascular: No carotid bruit or JVD.     Trachea: Trachea and phonation normal.  Cardiovascular:     Rate and Rhythm: Normal rate and regular rhythm.     Chest Wall: PMI is not displaced.     Pulses: Normal pulses.     Heart sounds: Normal heart sounds. No murmur heard.   No friction rub. No gallop.  Pulmonary:     Effort: Pulmonary effort is normal. No respiratory distress.     Breath sounds: Normal breath sounds. No wheezing.  Abdominal:     General: Bowel sounds are normal. There is no distension or abdominal bruit.     Palpations: Abdomen is soft. There is no hepatomegaly or splenomegaly.     Tenderness: There is no abdominal tenderness. There is no right CVA tenderness or left CVA tenderness.     Hernia: No hernia is present.  Musculoskeletal:        General: Normal range of motion.     Cervical back: Normal range of motion and neck supple.     Right lower leg: No  edema.     Left lower leg: No edema.  Lymphadenopathy:     Cervical: No cervical adenopathy.  Skin:    General: Skin is warm and dry.     Capillary Refill: Capillary refill takes less than 2 seconds.     Coloration: Skin is not cyanotic, jaundiced or pale.     Findings: No rash.  Neurological:     General: No focal deficit present.     Mental Status: She is alert and oriented to person, place, and time.     GCS: GCS eye subscore is 4. GCS verbal subscore is 5. GCS motor subscore is 6.     Cranial Nerves: Cranial nerves are intact. No cranial nerve deficit.     Sensory: Sensation is intact. No sensory deficit.     Motor: Motor function is intact. No weakness, tremor, atrophy, abnormal muscle tone, seizure activity or pronator drift.     Coordination: Coordination is intact. Romberg sign negative. Coordination normal. Finger-Nose-Finger Test and Heel to [facility] Test normal. Rapid alternating movements normal.     Gait: Gait is intact. Gait and tandem walk normal.     Deep Tendon Reflexes: Reflexes are normal and symmetric. Reflexes normal.     Reflex Scores:      Tricep reflexes are 2+ on the right side and 2+ on the left side.      Bicep reflexes are 2+ on the right side and 2+ on the left side.      Brachioradialis reflexes are 2+ on the right side and 2+ on the left side.      Patellar reflexes are 2+ on the right side and 2+ on the left side.      Achilles reflexes are 2+ on the right side and 2+ on the left side. Psychiatric:        Attention and Perception: Attention and perception normal.        Mood and Affect: Mood and affect normal.        Speech: Speech normal.  Behavior: Behavior normal. Behavior is cooperative.        Thought Content: Thought content normal.        Cognition and Memory: Cognition and memory normal.        Judgment: Judgment normal.    Results for orders placed or performed in visit on 07/31/21  CBC  Result Value Ref Range   WBC 3.4 3.4 - 10.8  x10E3/uL   RBC 4.50 3.77 - 5.28 x10E6/uL   Hemoglobin 14.8 11.1 - 15.9 g/dL   Hematocrit 42.7 34.0 - 46.6 %   MCV 95 79 - 97 fL   MCH 32.9 26.6 - 33.0 pg   MCHC 34.7 31.5 - 35.7 g/dL   RDW 11.5 (L) 11.7 - 15.4 %   Platelets 157 150 - 450 x10E3/uL  T4, free  Result Value Ref Range   Free T4 1.30 0.82 - 1.77 ng/dL  TSH  Result Value Ref Range   TSH 0.440 (L) 0.450 - 4.500 uIU/mL  Lipid panel  Result Value Ref Range   Cholesterol, Total 199 100 - 199 mg/dL   Triglycerides 120 0 - 149 mg/dL   HDL 80 >39 mg/dL   VLDL Cholesterol Cal 21 5 - 40 mg/dL   LDL Chol Calc (NIH) 98 0 - 99 mg/dL   Chol/HDL Ratio 2.5 0.0 - 4.4 ratio  CMP14+EGFR  Result Value Ref Range   Glucose 116 (H) 65 - 99 mg/dL   BUN 13 8 - 27 mg/dL   Creatinine, Ser 0.69 0.57 - 1.00 mg/dL   eGFR 89 >59 mL/min/1.73   BUN/Creatinine Ratio 19 12 - 28   Sodium 135 134 - 144 mmol/L   Potassium 4.6 3.5 - 5.2 mmol/L   Chloride 95 (L) 96 - 106 mmol/L   CO2 23 20 - 29 mmol/L   Calcium 9.6 8.7 - 10.3 mg/dL   Total Protein 6.8 6.0 - 8.5 g/dL   Albumin 4.5 3.7 - 4.7 g/dL   Globulin, Total 2.3 1.5 - 4.5 g/dL   Albumin/Globulin Ratio 2.0 1.2 - 2.2   Bilirubin Total 0.9 0.0 - 1.2 mg/dL   Alkaline Phosphatase 33 (L) 44 - 121 IU/L   AST 30 0 - 40 IU/L   ALT 26 0 - 32 IU/L       Pertinent labs & imaging results that were available during my care of the patient were reviewed by me and considered in my medical decision making.  Assessment & Plan:  Christine Cantrell was seen today for gait problem.  Diagnoses and all orders for this visit:  Transient weakness of right lower extremity Neuro exam normal. Ongoing for 6-7 months. Very brief in nature and only has occurred a few times. No other associated symptoms. Will obtain imaging of lumbar spine to assess for possible cause. If normal and symptoms persist, will refer to neurology. Pt aware if symptoms return and are persistent, she is to seek emergent evaluation. Pt aware of stroke like  symptoms, if present, she is to go to the ED.  -     DG Lumbar Spine 2-3 Views     Continue all other maintenance medications.  Follow up plan: Return in about 8 weeks (around 11/12/2021), or if symptoms worsen or fail to improve.   Continue healthy lifestyle choices, including diet (rich in fruits, vegetables, and lean proteins, and low in salt and simple carbohydrates) and exercise (at least 30 minutes of moderate physical activity daily).   The above assessment and management plan was discussed with  the patient. The patient verbalized understanding of and has agreed to the management plan. Patient is aware to call the clinic if they develop any new symptoms or if symptoms persist or worsen. Patient is aware when to return to the clinic for a follow-up visit. Patient educated on when it is appropriate to go to the emergency department.   Christine Pouch, FNP-C [facility] [phone]

## 2021-09-18 ENCOUNTER — Other Ambulatory Visit (INDEPENDENT_AMBULATORY_CARE_PROVIDER_SITE_OTHER): Payer: Medicare Other

## 2021-09-18 ENCOUNTER — Other Ambulatory Visit: Payer: Self-pay

## 2021-09-18 ENCOUNTER — Ambulatory Visit (INDEPENDENT_AMBULATORY_CARE_PROVIDER_SITE_OTHER): Payer: Medicare Other

## 2021-09-18 DIAGNOSIS — R29898 Other symptoms and signs involving the musculoskeletal system: Secondary | ICD-10-CM

## 2021-09-18 DIAGNOSIS — M25561 Pain in right knee: Secondary | ICD-10-CM

## 2021-09-18 DIAGNOSIS — M4807 Spinal stenosis, lumbosacral region: Secondary | ICD-10-CM | POA: Diagnosis not present

## 2021-09-18 DIAGNOSIS — M1711 Unilateral primary osteoarthritis, right knee: Secondary | ICD-10-CM | POA: Diagnosis not present

## 2021-09-18 DIAGNOSIS — M47816 Spondylosis without myelopathy or radiculopathy, lumbar region: Secondary | ICD-10-CM | POA: Diagnosis not present

## 2021-10-03 DIAGNOSIS — H5213 Myopia, bilateral: Secondary | ICD-10-CM | POA: Diagnosis not present

## 2021-10-03 DIAGNOSIS — H353131 Nonexudative age-related macular degeneration, bilateral, early dry stage: Secondary | ICD-10-CM | POA: Diagnosis not present

## 2021-10-07 ENCOUNTER — Ambulatory Visit (INDEPENDENT_AMBULATORY_CARE_PROVIDER_SITE_OTHER): Payer: Medicare Other

## 2021-10-07 VITALS — Ht 65.0 in | Wt 155.0 lb

## 2021-10-07 DIAGNOSIS — Z0000 Encounter for general adult medical examination without abnormal findings: Secondary | ICD-10-CM

## 2021-10-07 NOTE — Progress Notes (Signed)
Subjective:   Lavella Myren is a 75 y.o. female who presents for Medicare Annual (Subsequent) preventive examination.  Virtual Visit via Telephone Note  I connected with  Alia Parsley on 10/07/21 at  9:45 AM EDT by telephone and verified that I am speaking with the correct person using two identifiers.  Location: Patient: Home Provider: [facility] Persons participating in the virtual visit: patient/Nurse Health Advisor   I discussed the limitations, risks, security and privacy concerns of performing an evaluation and management service by telephone and the availability of in person appointments. The patient expressed understanding and agreed to proceed.  Interactive audio and video telecommunications were attempted between this nurse and patient, however failed, due to patient having technical difficulties OR patient did not have access to video capability.  We continued and completed visit with audio only.  Some vital signs may be absent or patient reported.   Seleena Reimers E Kiaraliz Rafuse, LPN   Review of Systems     Cardiac Risk Factors include: advanced age (>17mn, >>58women);dyslipidemia;hypertension;Other (see comment), Risk factor comments: hx breast cancer     Objective:    Today's Vitals   10/07/21 0937  Weight: 155 lb (70.3 kg)  Height: _0  (1.651 m)   Body mass index is 25.79 kg/m.  Advanced Directives 10/07/2021 11/30/2020 10/05/2020 08/22/2020 08/16/2020 10/05/2019 09/30/2018  Does Patient Have a Medical Advance Directive? _1  No No  Would patient like information on creating a medical advance directive? No - Patient declined No - Patient declined No - Patient declined No - Patient declined No - Patient declined No - Patient declined No - Patient declined    Current Medications (verified) Outpatient Encounter Medications as of 10/07/2021  Medication Sig   anastrozole (ARIMIDEX) 1 MG tablet Take 1 tablet (1 mg total) by mouth daily.   Coenzyme Q10-Vitamin E (QUNOL ULTRA  COQ10 PO) Take 1 capsule by mouth daily after lunch.    Krill Oil 500 MG CAPS Take 500 mg by mouth daily after lunch.    lisinopril (ZESTRIL) 10 MG tablet Take 1 tablet (10 mg total) by mouth daily.   lisinopril-hydrochlorothiazide (ZESTORETIC) 20-12.5 MG tablet Take 1 tablet by mouth daily.   Magnesium 250 MG TABS Take 250 mg by mouth daily after lunch.    Multiple Vitamin (MULTIVITAMIN WITH MINERALS) TABS tablet Take 1 tablet by mouth daily after lunch. Women's One A Day Multivitamin    simvastatin (ZOCOR) 20 MG tablet Take 1 tablet (20 mg total) by mouth daily.   SYNTHROID 75 MCG tablet TAKE 1 TABLET (75 MCG TOTAL) BY MOUTH DAILY BEFORE BREAKFAST.   No facility-administered encounter medications on file as of 10/07/2021.    Allergies (verified) Patient has no known allergies.   History: Past Medical History:  Diagnosis Date   Abnormal EKG saw dr hochrein 3 yrs ago for   hx of left bundle branch block on ekg since 2002   [place]    Depression    Diverticulosis large intestine w/o perforation or abscess w/bleeding 2016   Family history of lung cancer    Family history of pancreatic cancer    Family history of prostate cancer    GERD (gastroesophageal reflux disease)    hx of   High cholesterol    History of hiatal hernia    Hx of colonic polyps 2016   adenomatous   Hypertension    Hypothyroidism    Pancreatic cyst    Thyroid disease  hypothyroidism   Past Surgical History:  Procedure Laterality Date   ABDOMINAL HYSTERECTOMY  2002   complete for plyps   BREAST LUMPECTOMY WITH RADIOACTIVE SEED LOCALIZATION Right 08/22/2020   Procedure: RIGHT BREAST LUMPECTOMY WITH RADIOACTIVE SEED LOCALIZATION;  Surgeon: Stark Klein, MD;  Location: [place];  Service: General;  Laterality: Right;   ESOPHAGOGASTRODUODENOSCOPY N/A 09/30/2018   Procedure: ESOPHAGOGASTRODUODENOSCOPY (EGD);  Surgeon: Milus Banister, MD;  Location: [facility];  Service: Endoscopy;  Laterality:  N/A;   EUS N/A 09/30/2018   Procedure: UPPER ENDOSCOPIC ULTRASOUND (EUS) RADIAL;  Surgeon: Milus Banister, MD;  Location: [facility];  Service: Endoscopy;  Laterality: N/A;   LIVER BIOPSY     normal result   thryoid needle biopsy  1990's   THYROIDECTOMY  1982   Family History  Problem Relation Age of Onset   High Cholesterol Mother    Hypertension Mother    Goiter Mother    High Cholesterol Father    Hypertension Father    Hypertension Brother    Lung disease Brother        chronic smoker   Heart disease Brother    Goiter Sister    Heart disease Brother    Hypertension Brother    Prostate cancer Brother 63   Lung cancer Maternal Uncle        smoker   Pancreatic cancer Nephew        dx. 40s/50s   Cancer Nephew        pancreatic vs. multiple myeloma; dx. 40s/50s   Colon cancer Neg Hx    Stomach cancer Neg Hx    Social History   Socioeconomic History   Marital status: Single    Spouse name: Not on file   Number of children: 0   Years of education: 16   Highest education level: Bachelor's degree (e.g., BA, AB, BS)  Occupational History   Occupation: retired    Comment: Therapist, nutritional  Tobacco Use   Smoking status: Never   Smokeless tobacco: Never  Vaping Use   Vaping Use: Never used  Substance and Sexual Activity   Alcohol use: Yes    Alcohol/week: 1.0 standard drink    Types: 1 Glasses of wine per week    Comment: occasionally   Drug use: Never   Sexual activity: Not Currently    Partners: Male    Birth control/protection: Surgical  Other Topics Concern   Not on file  Social History Narrative   Lives alone.  No children   Her brother and sister live nearby   Social Determinants of Health   Financial Resource Strain: Low Risk    Difficulty of Paying Living Expenses: Not hard at all  Food Insecurity: No Food Insecurity   Worried About Charity fundraiser in the Last Year: Never true   Arboriculturist in the Last Year: Never true  Transportation  Needs: No Transportation Needs   Lack of Transportation (Medical): No   Lack of Transportation (Non-Medical): No  Physical Activity: Sufficiently Active   Days of Exercise per Week: 6 days   Minutes of Exercise per Session: 60 min  Stress: No Stress Concern Present   Feeling of Stress : Not at all  Social Connections: Moderately Integrated   Frequency of Communication with Friends and Family: More than three times a week   Frequency of Social Gatherings with Friends and Family: More than three times a week   Attends Religious Services: More than 4 times per  year   Active Member of Clubs or Organizations: Yes   Attends Music therapist: More than 4 times per year   Marital Status: Never married    Tobacco Counseling Counseling given: Not Answered   Clinical Intake:  Pre-visit preparation completed: Yes  Pain : No/denies pain     BMI - recorded: 25.79 Nutritional Status: BMI 25 -29 Overweight Nutritional Risks: None Diabetes: No  How often do you need to have someone help you when you read instructions, pamphlets, or other written materials from your doctor or pharmacy?: 1 - Never  Diabetic? No  Interpreter Needed?: No  Information entered by :: Jessa Stinson, LPN   Activities of Daily Living In your present state of health, do you have any difficulty performing the following activities: 10/07/2021  Hearing? N  Vision? N  Difficulty concentrating or making decisions? N  Walking or climbing stairs? N  Dressing or bathing? N  Doing errands, shopping? N  Preparing Food and eating ? N  Using the Toilet? N  In the past six months, have you accidently leaked urine? N  Do you have problems with loss of bowel control? N  Managing your Medications? N  Managing your Finances? N  Housekeeping or managing your Housekeeping? N  Some recent data might be hidden    Patient Care Team: Janora Norlander, DO as PCP - General (Family Medicine) Stark Klein, MD  as Consulting Physician (General Surgery) Nicholas Lose, MD as Consulting Physician (Hematology and Oncology) Gery Pray, MD as Consulting Physician (Radiation Oncology)  Indicate any recent Medical Services you may have received from other than [facility] providers in the past year (date may be approximate).     Assessment:   This is a routine wellness examination for Andersen.  Hearing/Vision screen Hearing Screening - Comments:: Denies hearing difficulties  Vision Screening - Comments:: Wears rx glasses - up to date with annual eye exams with Dr Delman Cheadle  Dietary issues and exercise activities discussed: Current Exercise Habits: Home exercise routine, Type of exercise: walking, Time (Minutes): 60, Frequency (Times/Week): 6, Weekly Exercise (Minutes/Week): 360, Intensity: Mild, Exercise limited by: None identified   Goals Addressed             This Visit's Progress    Prevent falls   On track    Stay active        Depression Screen PHQ 2/9 Scores 10/07/2021 09/17/2021 07/30/2021 12/19/2020 10/05/2020 06/19/2020 05/14/2020  PHQ - 2 Score 0 0 0 0 0 0 0  PHQ- 9 Score 0 1 - - - - -    Fall Risk Fall Risk  10/07/2021 09/17/2021 07/30/2021 12/19/2020 10/05/2020  Falls in the past year? 0 0 0 0 0  Number falls in past yr: 0 - - - -  Injury with Fall? 0 - - - -  Follow up Falls prevention discussed - - - Falls evaluation completed  Comment - - - - -    FALL RISK PREVENTION PERTAINING TO THE HOME:  Any stairs in or around the home? No  If so, are there any without handrails? No  Home free of loose throw rugs in walkways, pet beds, electrical cords, etc? Yes  Adequate lighting in your home to reduce risk of falls? Yes   ASSISTIVE DEVICES UTILIZED TO PREVENT FALLS:  Life alert? No  Use of a cane, walker or w/c? No  Grab bars in the bathroom? No  Shower chair or bench in shower? No  Elevated toilet seat  or a handicapped toilet? No   TIMED UP AND GO:  Was the test performed? No .  Telephonic visit  Cognitive Function: Normal cognitive status assessed by direct observation by this Nurse Health Advisor. No abnormalities found.    MMSE - Mini Mental State Exam 07/15/2018  Orientation to time 5  Orientation to Place 5  Registration 3  Attention/ Calculation 5  Recall 3  Language- name 2 objects 2  Language- repeat 1  Language- follow 3 step command 3  Language- read & follow direction 1  Write a sentence 1  Copy design 1  Total score 30     6CIT Screen 10/05/2020 10/05/2019  What Year? 0 points 0 points  What month? - 0 points  What time? 0 points 0 points  Count back from 20 0 points 0 points  Months in reverse 0 points 0 points  Repeat phrase 0 points 2 points  Total Score - 2    Immunizations Immunization History  Administered Date(s) Administered   Fluad Quad(high Dose 65+) 08/27/2019, 08/27/2019   Influenza, High Dose Seasonal PF 09/07/2018   Influenza-Unspecified 09/26/2015, 09/17/2016, 09/25/2017, 09/01/2020   PFIZER(Purple Top)SARS-COV-2 Vaccination 01/20/2020, 02/10/2020, 08/28/2020, 03/30/2021   Pneumococcal Polysaccharide-23 09/25/2017   Zoster Recombinat (Shingrix) 06/10/2017, 09/03/2017    TDAP status: Up to date  Flu Vaccine status: Up to date  Pneumococcal vaccine status: Up to date  Covid-19 vaccine status: Completed vaccines  Qualifies for Shingles Vaccine? Yes   Zostavax completed Yes   Shingrix Completed?: Yes  Screening Tests Health Maintenance  Topic Date Due   COVID-19 Vaccine (5 - Booster for [place] series) 07/30/2021   INFLUENZA VACCINE  03/28/2022 (Originally 07/29/2021)   TETANUS/TDAP  07/30/2022 (Originally 03/01/1965)   COLONOSCOPY (Pts 45-56yr Insurance coverage will need to be confirmed)  03/27/2022   MAMMOGRAM  06/25/2023   DEXA SCAN  09/05/2025   Hepatitis C Screening  Completed   Zoster Vaccines- Shingrix  Completed   HPV VACCINES  Aged Out    Health Maintenance  Health Maintenance Due  Topic Date Due    COVID-19 Vaccine (5 - Booster for [place]) 07/30/2021    Colorectal cancer screening: Type of screening: Colonoscopy. Completed 03/28/2015. Repeat every 7 years  Mammogram status: Completed 07/04/2021. Repeat every year  Bone Density status: Completed 09/05/2020. Results reflect: Bone density results: NORMAL. Repeat every 5 years.  Lung Cancer Screening: (Low Dose CT Chest recommended if Age 77-80years, 30 pack-year currently smoking OR have quit w/in 15years.) does not qualify.   Additional Screening:  Hepatitis C Screening: does qualify; Completed 09/01/2018  Vision Screening: Recommended annual ophthalmology exams for early detection of glaucoma and other disorders of the eye. Is the patient up to date with their annual eye exam?  Yes  Who is the provider or what is the name of the office in which the patient attends annual eye exams? [facility] If pt is not established with a provider, would they like to be referred to a provider to establish care? No .   Dental Screening: Recommended annual dental exams for proper oral hygiene  Community Resource Referral / Chronic Care Management: CRR required this visit?  No   CCM required this visit?  No      Plan:     I have personally reviewed and noted the following in the patient's chart:   Medical and social history Use of alcohol, tobacco or illicit drugs  Current medications and supplements including opioid prescriptions.  Functional ability and status Nutritional status Physical activity Advanced directives List of other physicians Hospitalizations, surgeries, and ER visits in previous 12 months Vitals Screenings to include cognitive, depression, and falls Referrals and appointments  In addition, I have reviewed and discussed with patient certain preventive protocols, quality metrics, and best practice recommendations. A written personalized care plan for preventive services as well as general  preventive health recommendations were provided to patient.     Sandrea Hammond, LPN   [id number]   Nurse Notes: None

## 2021-10-07 NOTE — Patient Instructions (Signed)
Christine Cantrell , Thank you for taking time to come for your Medicare Wellness Visit. I appreciate your ongoing commitment to your health goals. Please review the following plan we discussed and let me know if I can assist you in the future.   Screening recommendations/referrals: Colonoscopy: Done 03/28/2015 - Repeat in 7 years  Mammogram: Done 07/04/2021 - Repeat annually  Bone Density: Done 09/05/2020 - Repeat in 5 years  Recommended yearly ophthalmology/optometry visit for glaucoma screening and checkup Recommended yearly dental visit for hygiene and checkup  Vaccinations: Influenza vaccine: Done 09/01/2020 - Repeat annually  Pneumococcal vaccine: Done 09/25/2017 - due for other  Tdap vaccine: Due. Every 10 years Shingles vaccine: Done 06/10/2017 & 09/03/2017   Covid-19: Done 01/20/2020, 02/10/2020, 08/28/2020, & 03/30/2021  Advanced directives: Advance directive discussed with you today. Even though you declined this today, please call our office should you change your mind, and we can give you the proper paperwork for you to fill out.   Conditions/risks identified: Aim for 30 minutes of exercise or brisk walking each day, drink 6-8 glasses of water and eat lots of fruits and vegetables.   Next appointment: Follow up in one year for your annual wellness visit    Preventive Care 65 Years and Older, Female Preventive care refers to lifestyle choices and visits with your health care provider that can promote health and wellness. What does preventive care include? A yearly physical exam. This is also called an annual well check. Dental exams once or twice a year. Routine eye exams. Ask your health care provider how often you should have your eyes checked. Personal lifestyle choices, including: Daily care of your teeth and gums. Regular physical activity. Eating a healthy diet. Avoiding tobacco and drug use. Limiting alcohol use. Practicing safe sex. Taking low-dose aspirin every day. Taking vitamin  and mineral supplements as recommended by your health care provider. What happens during an annual well check? The services and screenings done by your health care provider during your annual well check will depend on your age, overall health, lifestyle risk factors, and family history of disease. Counseling  Your health care provider may ask you questions about your: Alcohol use. Tobacco use. Drug use. Emotional well-being. Home and relationship well-being. Sexual activity. Eating habits. History of falls. Memory and ability to understand (cognition). Work and work Statistician. Reproductive health. Screening  You may have the following tests or measurements: Height, weight, and BMI. Blood pressure. Lipid and cholesterol levels. These may be checked every 5 years, or more frequently if you are over 22 years old. Skin check. Lung cancer screening. You may have this screening every year starting at age 28 if you have a 30-pack-year history of smoking and currently smoke or have quit within the past 15 years. Fecal occult blood test (FOBT) of the stool. You may have this test every year starting at age 6. Flexible sigmoidoscopy or colonoscopy. You may have a sigmoidoscopy every 5 years or a colonoscopy every 10 years starting at age 31. Hepatitis C blood test. Hepatitis B blood test. Sexually transmitted disease (STD) testing. Diabetes screening. This is done by checking your blood sugar (glucose) after you have not eaten for a while (fasting). You may have this done every 1-3 years. Bone density scan. This is done to screen for osteoporosis. You may have this done starting at age 45. Mammogram. This may be done every 1-2 years. Talk to your health care provider about how often you should have regular mammograms. Talk  with your health care provider about your test results, treatment options, and if necessary, the need for more tests. Vaccines  Your health care provider may recommend  certain vaccines, such as: Influenza vaccine. This is recommended every year. Tetanus, diphtheria, and acellular pertussis (Tdap, Td) vaccine. You may need a Td booster every 10 years. Zoster vaccine. You may need this after age 37. Pneumococcal 13-valent conjugate (PCV13) vaccine. One dose is recommended after age 27. Pneumococcal polysaccharide (PPSV23) vaccine. One dose is recommended after age 77. Talk to your health care provider about which screenings and vaccines you need and how often you need them. This information is not intended to replace advice given to you by your health care provider. Make sure you discuss any questions you have with your health care provider. Document Released: 01/11/2016 Document Revised: 09/03/2016 Document Reviewed: 10/16/2015 Elsevier Interactive Patient Education  2017 [place] Prevention in the Home Falls can cause injuries. They can happen to people of all ages. There are many things you can do to make your home safe and to help prevent falls. What can I do on the outside of my home? Regularly fix the edges of walkways and driveways and fix any cracks. Remove anything that might make you trip as you walk through a door, such as a raised step or threshold. Trim any bushes or trees on the path to your home. Use bright outdoor lighting. Clear any walking paths of anything that might make someone trip, such as rocks or tools. Regularly check to see if handrails are loose or broken. Make sure that both sides of any steps have handrails. Any raised decks and porches should have guardrails on the edges. Have any leaves, snow, or ice cleared regularly. Use sand or salt on walking paths during winter. Clean up any spills in your garage right away. This includes oil or grease spills. What can I do in the bathroom? Use night lights. Install grab bars by the toilet and in the tub and shower. Do not use towel bars as grab bars. Use non-skid mats or  decals in the tub or shower. If you need to sit down in the shower, use a plastic, non-slip stool. Keep the floor dry. Clean up any water that spills on the floor as soon as it happens. Remove soap buildup in the tub or shower regularly. Attach bath mats securely with double-sided non-slip rug tape. Do not have throw rugs and other things on the floor that can make you trip. What can I do in the bedroom? Use night lights. Make sure that you have a light by your bed that is easy to reach. Do not use any sheets or blankets that are too big for your bed. They should not hang down onto the floor. Have a firm chair that has side arms. You can use this for support while you get dressed. Do not have throw rugs and other things on the floor that can make you trip. What can I do in the kitchen? Clean up any spills right away. Avoid walking on wet floors. Keep items that you use a lot in easy-to-reach places. If you need to reach something above you, use a strong step stool that has a grab bar. Keep electrical cords out of the way. Do not use floor polish or wax that makes floors slippery. If you must use wax, use non-skid floor wax. Do not have throw rugs and other things on the floor that can make you  trip. What can I do with my stairs? Do not leave any items on the stairs. Make sure that there are handrails on both sides of the stairs and use them. Fix handrails that are broken or loose. Make sure that handrails are as long as the stairways. Check any carpeting to make sure that it is firmly attached to the stairs. Fix any carpet that is loose or worn. Avoid having throw rugs at the top or bottom of the stairs. If you do have throw rugs, attach them to the floor with carpet tape. Make sure that you have a light switch at the top of the stairs and the bottom of the stairs. If you do not have them, ask someone to add them for you. What else can I do to help prevent falls? Wear shoes that: Do not  have high heels. Have rubber bottoms. Are comfortable and fit you well. Are closed at the toe. Do not wear sandals. If you use a stepladder: Make sure that it is fully opened. Do not climb a closed stepladder. Make sure that both sides of the stepladder are locked into place. Ask someone to hold it for you, if possible. Clearly mark and make sure that you can see: Any grab bars or handrails. First and last steps. Where the edge of each step is. Use tools that help you move around (mobility aids) if they are needed. These include: Canes. Walkers. Scooters. Crutches. Turn on the lights when you go into a dark area. Replace any light bulbs as soon as they burn out. Set up your furniture so you have a clear path. Avoid moving your furniture around. If any of your floors are uneven, fix them. If there are any pets around you, be aware of where they are. Review your medicines with your doctor. Some medicines can make you feel dizzy. This can increase your chance of falling. Ask your doctor what other things that you can do to help prevent falls. This information is not intended to replace advice given to you by your health care provider. Make sure you discuss any questions you have with your health care provider. Document Released: 10/11/2009 Document Revised: 05/22/2016 Document Reviewed: 01/19/2015 Elsevier Interactive Patient Education  2017 [employer].

## 2021-10-09 ENCOUNTER — Telehealth: Payer: Self-pay | Admitting: Family Medicine

## 2021-10-09 NOTE — Telephone Encounter (Signed)
Her blood pressure was controlled during her annual physical and I was in the impression that she was taking these both at that time.  She may discontinue the 10 mg lisinopril but I would like her to have her blood pressure checked within the next week or 2 to ensure that she is not seeing a rise.  We could consider increasing her lisinopril portion of the combo pill if needed to compensate

## 2021-10-09 NOTE — Telephone Encounter (Signed)
Pt is taking lisinopril and hctz combined and she has question

## 2021-10-09 NOTE — Telephone Encounter (Signed)
Left detailed message on patient's voice mail.

## 2021-10-09 NOTE — Telephone Encounter (Signed)
Patient notified and will keep a log of Bps and let us know how they are in a couple weeks

## 2021-10-09 NOTE — Telephone Encounter (Signed)
  Prescription Request  10/09/2021   What is the name of the medication or equipment? SYNTHROID 50MG   Have you contacted your pharmacy to request a refill? YES  Which pharmacy would you like this sent to? [facility]  Pt says she takes Synthroid 50 mg once a day and was told by pharmacist that she needed to contact her pcp office to get more refills called in.  (We have Rx listed for 75mg  but pt says she has been taking 50mg  for quite a while)

## 2021-10-09 NOTE — Telephone Encounter (Signed)
Patient states that she is currently taking lisinopril/hctz 20/12.5 and also taking lisinopril 10mg . She states that she has been taking it this way for quite some time. She wants to know if she is ok to stop the plain lisinopril 10mg  because she has a hard time remembering to take it.

## 2021-10-28 ENCOUNTER — Telehealth: Payer: Self-pay

## 2021-10-28 DIAGNOSIS — K862 Cyst of pancreas: Secondary | ICD-10-CM

## 2021-10-28 NOTE — Telephone Encounter (Signed)
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

## 2021-10-28 NOTE — Telephone Encounter (Signed)
Called and scheduled patient for MRCP on Wednesday, 11-16 at 1 pm. To arrive 30 minutes prior and NPO 4 hours. Letter and MyChart message sent to pt

## 2021-11-13 ENCOUNTER — Other Ambulatory Visit: Payer: Self-pay

## 2021-11-13 ENCOUNTER — Other Ambulatory Visit: Payer: Self-pay | Admitting: Gastroenterology

## 2021-11-13 ENCOUNTER — Ambulatory Visit (HOSPITAL_COMMUNITY)
Admission: RE | Admit: 2021-11-13 | Discharge: 2021-11-13 | Disposition: A | Discharge status: Home / Self Care | Payer: Medicare Other | Source: Ambulatory Visit | Attending: Gastroenterology | Admitting: Gastroenterology

## 2021-11-13 DIAGNOSIS — K862 Cyst of pancreas: Secondary | ICD-10-CM | POA: Insufficient documentation

## 2021-11-13 DIAGNOSIS — K449 Diaphragmatic hernia without obstruction or gangrene: Secondary | ICD-10-CM | POA: Diagnosis not present

## 2021-11-13 DIAGNOSIS — R935 Abnormal findings on diagnostic imaging of other abdominal regions, including retroperitoneum: Secondary | ICD-10-CM | POA: Diagnosis not present

## 2021-11-13 MED ORDER — GADOBUTROL 1 MMOL/ML IV SOLN
7.0000 mL | Freq: Once | INTRAVENOUS | Status: AC | PRN
  Administered 2021-11-13: 7 mL via INTRAVENOUS

## 2021-11-15 ENCOUNTER — Telehealth: Payer: Self-pay | Admitting: Gastroenterology

## 2021-11-15 ENCOUNTER — Other Ambulatory Visit: Payer: Self-pay

## 2021-11-15 DIAGNOSIS — K862 Cyst of pancreas: Secondary | ICD-10-CM

## 2021-11-15 NOTE — Telephone Encounter (Signed)
Returned call to patient. Pt wants a copy of her MRCP results mailed to her home. Pt confirmed her address on file. Pt had no other concerns at the end of the call.

## 2021-11-15 NOTE — Telephone Encounter (Signed)
Patient is requesting a copy of the lab results.

## 2021-11-27 NOTE — Telephone Encounter (Signed)
Called and spoke with patient. Advised that she received the full report. Advised that pages 4-7 are a duplicate report/recommendations. She confirms that she has the report and can see Dr. Doyne Keel recommendations. Pt does not wish to pick up the additional papers, she had no concerns at the end of the call.

## 2021-11-27 NOTE — Telephone Encounter (Signed)
Inbound call from patient. States she only received 3 out 7 pages of results. Requesting if she can pick up the remaining pages.

## 2022-01-08 ENCOUNTER — Other Ambulatory Visit: Payer: Self-pay | Admitting: Family Medicine

## 2022-01-08 DIAGNOSIS — E890 Postprocedural hypothyroidism: Secondary | ICD-10-CM

## 2022-01-09 ENCOUNTER — Other Ambulatory Visit: Payer: Self-pay | Admitting: Family Medicine

## 2022-01-09 DIAGNOSIS — E890 Postprocedural hypothyroidism: Secondary | ICD-10-CM

## 2022-01-10 ENCOUNTER — Other Ambulatory Visit: Payer: Self-pay | Admitting: Family Medicine

## 2022-01-15 ENCOUNTER — Encounter (HOSPITAL_COMMUNITY): Payer: Self-pay

## 2022-01-20 ENCOUNTER — Encounter: Payer: Self-pay | Admitting: Family Medicine

## 2022-01-20 ENCOUNTER — Ambulatory Visit (INDEPENDENT_AMBULATORY_CARE_PROVIDER_SITE_OTHER): Payer: Medicare Other | Admitting: Family Medicine

## 2022-01-20 VITALS — BP 154/86 | HR 79 | Temp 97.8°F | Ht 65.0 in | Wt 163.2 lb

## 2022-01-20 DIAGNOSIS — Z23 Encounter for immunization: Secondary | ICD-10-CM

## 2022-01-20 DIAGNOSIS — I10 Essential (primary) hypertension: Secondary | ICD-10-CM

## 2022-01-20 DIAGNOSIS — E890 Postprocedural hypothyroidism: Secondary | ICD-10-CM

## 2022-01-20 NOTE — Patient Instructions (Signed)
Bring your BP cuff in so we can ensure accuracy.  If BP still >150/90, will plan to increase either the HCTZ portion of your am pill OR increase the Lisinopril in the pm.

## 2022-01-20 NOTE — Progress Notes (Signed)
Subjective: [id number] PCP: Christine Norlander, DO BWG:YKZLD Christine Cantrell is a 76 y.o. female presenting to clinic today for:  1.  Hypothyroidism, postsurgical Patient is compliant with Synthroid daily.  No tremor, difficulty swallowing, change in bowel habits or heart palpitations.  2.  Hypertension Patient is compliant with lisinopril 20-12.5 mg each day and 10 mg each evening.  No chest pain, shortness of breath, edema.  She continues to walk 4 miles per day every day  ROS: Per HPI  No Known Allergies Past Medical History:  Diagnosis Date   Abnormal EKG saw dr hochrein 3 yrs ago for   hx of left bundle branch block on ekg since 2002   Breast cancer ([place])    Depression    Diverticulosis large intestine w/o perforation or abscess w/bleeding 2016   Family history of lung cancer    Family history of pancreatic cancer    Family history of prostate cancer    GERD (gastroesophageal reflux disease)    hx of   High cholesterol    History of hiatal hernia    Hx of colonic polyps 2016   adenomatous   Hypertension    Hypothyroidism    Pancreatic cyst    Thyroid disease    hypothyroidism    Current Outpatient Medications:    anastrozole (ARIMIDEX) 1 MG tablet, Take 1 tablet (1 mg total) by mouth daily., Disp: 90 tablet, Rfl: 3   Coenzyme Q10-Vitamin E (QUNOL ULTRA COQ10 PO), Take 1 capsule by mouth daily after lunch. , Disp: , Rfl:    Krill Oil 500 MG CAPS, Take 500 mg by mouth daily after lunch. , Disp: , Rfl:    lisinopril (ZESTRIL) 10 MG tablet, Take 1 tablet (10 mg total) by mouth daily., Disp: 90 tablet, Rfl: 3   lisinopril-hydrochlorothiazide (ZESTORETIC) 20-12.5 MG tablet, Take 1 tablet by mouth daily., Disp: 90 tablet, Rfl: 3   Magnesium 250 MG TABS, Take 250 mg by mouth daily after lunch. , Disp: , Rfl:    Multiple Vitamin (MULTIVITAMIN WITH MINERALS) TABS tablet, Take 1 tablet by mouth daily after lunch. Women's One A Day Multivitamin , Disp: , Rfl:    simvastatin  (ZOCOR) 20 MG tablet, Take 1 tablet (20 mg total) by mouth daily., Disp: 90 tablet, Rfl: 3   SYNTHROID 75 MCG tablet, TAKE 1 TABLET BY MOUTH DAILY BEFORE BREAKFAST., Disp: 90 tablet, Rfl: 0 Social History   Socioeconomic History   Marital status: Single    Spouse name: Not on file   Number of children: 0   Years of education: 16   Highest education level: Bachelor's degree (e.g., BA, AB, BS)  Occupational History   Occupation: retired    Comment: Therapist, nutritional  Tobacco Use   Smoking status: Never   Smokeless tobacco: Never  Vaping Use   Vaping Use: Never used  Substance and Sexual Activity   Alcohol use: Yes    Alcohol/week: 1.0 standard drink    Types: 1 Glasses of wine per week    Comment: occasionally   Drug use: Never   Sexual activity: Not Currently    Partners: Male    Birth control/protection: Surgical  Other Topics Concern   Not on file  Social History Narrative   Lives alone.  No children   Her brother and sister live nearby   Social Determinants of Health   Financial Resource Strain: Low Risk    Difficulty of Paying Living Expenses: Not hard at all  Food Insecurity: No  Food Insecurity   Worried About Charity fundraiser in the Last Year: Never true   Ran Out of Food in the Last Year: Never true  Transportation Needs: No Transportation Needs   Lack of Transportation (Medical): No   Lack of Transportation (Non-Medical): No  Physical Activity: Sufficiently Active   Days of Exercise per Week: 6 days   Minutes of Exercise per Session: 60 min  Stress: No Stress Concern Present   Feeling of Stress : Not at all  Social Connections: Moderately Integrated   Frequency of Communication with Friends and Family: More than three times a week   Frequency of Social Gatherings with Friends and Family: More than three times a week   Attends Religious Services: More than 4 times per year   Active Member of [employer] or Organizations: Yes   Attends Arts administrator: More than 4 times per year   Marital Status: Never married  Human resources officer Violence: Not At Risk   Fear of Current or Ex-Partner: No   Emotionally Abused: No   Physically Abused: No   Sexually Abused: No   Family History  Problem Relation Age of Onset   High Cholesterol Mother    Hypertension Mother    Goiter Mother    High Cholesterol Father    Hypertension Father    Hypertension Brother    Lung disease Brother        chronic smoker   Heart disease Brother    Goiter Sister    Heart disease Brother    Hypertension Brother    Prostate cancer Brother 49   Lung cancer Maternal Uncle        smoker   Pancreatic cancer Nephew        dx. 40s/50s   Cancer Nephew        pancreatic vs. multiple myeloma; dx. 40s/50s   Colon cancer Neg Hx    Stomach cancer Neg Hx     Objective: Office vital signs reviewed. BP (!) 154/86    Pulse 79    Temp 97.8 F (36.6 C)    Ht 5' 5"  (1.651 m)    Wt 163 lb 3.2 oz (74 kg)    SpO2 94%    BMI 27.16 kg/m   Physical Examination:  General: Awake, alert, well nourished, No acute distress HEENT: sclera white, no exophthalmos, thyroid surgically absent Cardio: regular rate and rhythm, S1S2 heard, soft systolic murmur at RSB Pulm: clear to auscultation bilaterally, no wheezes, rhonchi or rales; normal work of breathing on room air Extremities: warm, well perfused, No edema, cyanosis or clubbing; +2 pulses bilaterally MSK: normal gait and station Skin: dry; intact; no rashes or lesions; normal temp Neuro: no tremor  Assessment/ Plan: 76 y.o. female   Postoperative hypothyroidism - Plan: TSH, T4, free  Need for pneumococcal vaccination - Plan: Pneumococcal conjugate vaccine 20-valent (Prevnar 20)  Essential hypertension  Asymptomatic from a thyroid standpoint.  Check thyroid levels  Blood pressure is not controlled.  I am going to have her bring and her blood pressure monitor in the next couple of weeks for evaluation.  If blood  pressure remains above 150/90, we will likely increase her p.m. dose of lisinopril to 20 mg and that she is getting a total of 40 mg lisinopril daily.  Alternatively, we can increase the diuretic portion of her Zestoretic to 25 mg.  We will await repeat blood pressure reading the next couple of weeks before making any changes as she  has previously been normotensive.  Orders Placed This Encounter  Procedures   Pneumococcal conjugate vaccine 20-valent (Prevnar 20)   TSH   T4, free   No orders of the defined types were placed in this encounter.    Christine Norlander, DO [facility] [phone]

## 2022-01-21 ENCOUNTER — Other Ambulatory Visit: Payer: Self-pay | Admitting: Family Medicine

## 2022-01-21 LAB — T4, FREE: Free T4: 1.16 ng/dL (ref 0.82–1.77)

## 2022-01-21 LAB — TSH: TSH: 1.03 u[IU]/mL (ref 0.450–4.500)

## 2022-01-21 MED ORDER — LEVOTHYROXINE SODIUM 75 MCG PO TABS: 75.0000 ug | ORAL_TABLET | Freq: Every day | ORAL | 3 refills | Status: DC

## 2022-01-29 ENCOUNTER — Ambulatory Visit (INDEPENDENT_AMBULATORY_CARE_PROVIDER_SITE_OTHER): Payer: Medicare Other | Admitting: *Deleted

## 2022-01-29 VITALS — BP 175/83 | HR 78

## 2022-01-29 DIAGNOSIS — I10 Essential (primary) hypertension: Secondary | ICD-10-CM

## 2022-01-29 NOTE — Progress Notes (Signed)
Patient came in today for a bp check and for [place] to compare her BP machine to ours. Patient's BP on our machine was  173/84 P- 77, Her machine BP was 175/83 P78. Patient advised to monitor BP at home by checking it daily and to keep a log of her readings for Dr. Lajuana Ripple to review. Patient also aware to bring that log by after a week to let Dr. Lajuana Ripple review. Patient verbalized understanding. Patient also made aware that if Dr. Lajuana Ripple wanted to make any changes we would let her know.

## 2022-03-17 ENCOUNTER — Ambulatory Visit (INDEPENDENT_AMBULATORY_CARE_PROVIDER_SITE_OTHER): Payer: Medicare Other | Admitting: Family Medicine

## 2022-03-17 ENCOUNTER — Encounter: Payer: Self-pay | Admitting: Family Medicine

## 2022-03-17 ENCOUNTER — Telehealth: Payer: Self-pay | Admitting: Family Medicine

## 2022-03-17 ENCOUNTER — Other Ambulatory Visit: Payer: Self-pay | Admitting: Family Medicine

## 2022-03-17 VITALS — BP 163/85 | HR 87 | Temp 97.5°F | Ht 65.0 in | Wt 162.2 lb

## 2022-03-17 DIAGNOSIS — S83411A Sprain of medial collateral ligament of right knee, initial encounter: Secondary | ICD-10-CM

## 2022-03-17 NOTE — Telephone Encounter (Signed)
Pt came in stating that she wanted to let Dr Livia Snellen know that she went to [facility] to see if they accepted her insurance so that the order for her walker can be sent there. It was confirmed that they do accept her insurance and wants Dr Livia Snellen to send order to them.  ?
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
## 2022-03-17 NOTE — Telephone Encounter (Signed)
Done. Scrip delivered to [facility] for fax to [place]. ?

## 2022-03-17 NOTE — Telephone Encounter (Signed)
Pt aware.

## 2022-03-20 ENCOUNTER — Ambulatory Visit: Payer: Medicare Other | Admitting: Family Medicine

## 2022-03-31 ENCOUNTER — Ambulatory Visit (INDEPENDENT_AMBULATORY_CARE_PROVIDER_SITE_OTHER): Payer: Medicare Other | Admitting: Family Medicine

## 2022-03-31 ENCOUNTER — Telehealth: Payer: Self-pay | Admitting: Family Medicine

## 2022-03-31 ENCOUNTER — Encounter: Payer: Self-pay | Admitting: Family Medicine

## 2022-03-31 VITALS — BP 152/84 | HR 75 | Temp 97.5°F | Ht 65.0 in | Wt 160.4 lb

## 2022-03-31 DIAGNOSIS — S838X1D Sprain of other specified parts of right knee, subsequent encounter: Secondary | ICD-10-CM

## 2022-03-31 NOTE — Patient Instructions (Signed)
Meniscus Tear ?A meniscus tear is a knee injury that happens when a piece of the meniscus is torn. The meniscus is a thick, rubbery, wedge-shaped piece of cartilage in the knee. Each knee has two menisci sitting between the upper bone (femur) and lower bone (tibia) that form the knee joint. Each meniscus acts as a shock absorber for the knee. ?A torn meniscus is a common knee injury, ranging from mild to severe. Surgery may be needed to repair a severe tear. ?What are the causes? ?This condition may be caused by kneeling, squatting, twisting, or pivoting movements. Sports-related injuries are the most common cause, often resulting from: ?Running and stopping suddenly. ?Changing direction. ?Being tackled or knocked off your feet. ?Lifting or carrying heavy weights. ?As people get older, their menisci get thinner and weaker. Tears can happen more easily in older people, for example, when climbing stairs. ?What increases the risk? ?You are more likely to develop this condition if you: ?Play contact sports. ?Have a job that requires kneeling or squatting. ?Are female. ?Are over 86 years old. ?What are the signs or symptoms? ?Symptoms of this condition include: ?Knee pain, especially at the side of the knee joint. You may feel pain immediately after injury, or hear a pop and feel pain later. ?A feeling that your knee is clicking, catching, locking, or giving way (weakness, instability). ?Not being able to fully bend or extend your knee. ?Bruising or swelling in your knee. ?How is this diagnosed? ?This condition may be diagnosed based on your symptoms and a physical exam. ?You may also have tests, such as: ?X-rays. ?MRI. ?Arthroscopy. This is a procedure to look inside your knee with a narrow surgical telescope. ?You may be referred to a knee specialist (orthopedic surgeon). ?How is this treated? ?Treatment for this injury depends on the severity of the tear. Treatment for a mild tear may include: ?Rest. ?Medicine to reduce  pain and swelling, usually a nonsteroidal anti-inflammatory drug (NSAID), like ibuprofen. ?A knee brace, sleeve, or wrap. ?Using crutches or a walker to keep weight off your knee and help with walking. ?Exercises to strengthen your knee (physical therapy). ?You may need surgery if you have a severe tear or if other treatments fail. ?Follow these instructions at home: ?If you have a brace, sleeve, or wrap: ?Wear it, as told by your health care provider. Remove it, only as told by your health care provider. ?Loosen the brace, sleeve, or wrap if your toes tingle, become numb, or turn cold and blue. ?Keep the brace, sleeve, or wrap clean. ?If the brace, sleeve, or wrap is not waterproof: ?Do not let it get wet. ?Cover it with a watertight covering when you take a bath or shower. ?Managing pain, stiffness, and swelling ? ?Take over-the-counter and prescription medicines only as told by your health care provider. ?If directed, put ice on your knee. To do this: ?If you have a removable brace, sleeve, or wrap, remove it as told by your health care provider. ?Put ice in a plastic bag. ?Place a towel between your skin and the bag. ?Leave the ice on for 20 minutes, 2-3 times per day. ?Remove the ice if your skin turns bright red. This is very important. If you cannot feel pain, heat, or cold, you have a greater risk of damage to the area. ?Move your toes often to reduce stiffness and swelling. ?Raise (elevate) the injured area above the level of your heart while you are sitting or lying down. ?Activity ?Do not use  the injured limb to support your body weight until your health care provider says that you can. Use crutches or a walker as told by your health care provider. ?Return to your normal activities as told by your health care provider. Ask your health care provider what activities are safe for you. ?Perform range-of-motion exercises only as told by your health care provider. ?Begin doing exercises to strengthen your knee  and leg muscles only as told by your health care provider. After you recover, your health care provider may recommend these exercises to help prevent another injury. ?General instructions ?Use a knee brace, sleeve, or wrap as told by your health care provider. ?Ask your health care provider when it is safe to drive if you have a brace, sleeve, or wrap on your knee. ?Do not use any products that contain nicotine or tobacco, such as cigarettes, e-cigarettes, and chewing tobacco. If you need help quitting, ask your health care provider. ?Ask your health care provider if the medicine prescribed to you: ?Requires you to avoid driving or using heavy machinery. ?Can cause constipation. You may need to take these actions to prevent or treat constipation: ?Drink enough fluid to keep your urine pale yellow. ?Take over-the-counter or prescription medicines. ?Eat foods that are high in fiber, such as beans, whole grains, and fresh fruits and vegetables. ?Limit foods that are high in fat and processed sugars, such as fried or sweet foods. ?Keep all follow-up visits. This is important. ?Contact a health care provider if: ?You have a fever. ?Your knee becomes red, tender, or swollen. ?Your pain medicine is not controlling your pain. ?Your symptoms get worse or do not improve after 2 weeks of home care. ?Summary ?A meniscus tear is a knee injury that happens when a piece of the meniscus is torn. ?Treatment for this injury depends on the severity of the tear. You may need surgery if you have a severe tear or if other treatments fail. ?Rest, ice, and raise (elevate) your injured knee, as told by your health care provider. This will help lessen pain and swelling. ?Contact a health care provider if you have new symptoms, your symptoms worsen, or they do not improve after 2 weeks of home care. ?Keep all follow-up visits. This is important. ?This information is not intended to replace advice given to you by your health care provider.  Make sure you discuss any questions you have with your health care provider. ?Document Revised: 04/26/2020 Document Reviewed: 04/26/2020 ?Elsevier Patient Education ? [place]. ? ?

## 2022-03-31 NOTE — Progress Notes (Signed)
? ?Subjective: ?CC: 2-week recheck ?PCP: Janora Norlander, DO ?CHE:Christine Cantrell is a 76 y.o. female presenting to clinic today for: ? ?1.  Right knee pain ?Patient was seen 2 weeks ago for injury of the right knee.  She had apparently stepped off a curb and felt a knee pop.  She notes that she continues to have some instability and discomfort along the medial aspect of the right knee with certain movements like pivoting or going up or down stairs, etc.  She has been utilizing a knee brace which does help stabilize that knee.  She admits that because of this knee injury she has not been walking which bothers her.  She typically stays very physically active.  Had some swelling after the injury and still has mild swelling but denies any bruising.  Has not been doing any physical therapy exercises, etc. Also not using any medications for pain control since pain is only intermittent. ? ? ?ROS: Per HPI ? ?No Known Allergies ?Past Medical History:  ?Diagnosis Date  ? Abnormal EKG saw dr hochrein 3 yrs ago for  ? hx of left bundle branch block on ekg since 2002  ? Breast cancer ([place])   ? Depression   ? Diverticulosis large intestine w/o perforation or abscess w/bleeding 2016  ? Family history of lung cancer   ? Family history of pancreatic cancer   ? Family history of prostate cancer   ? GERD (gastroesophageal reflux disease)   ? hx of  ? High cholesterol   ? History of hiatal hernia   ? Hx of colonic polyps 2016  ? adenomatous  ? Hypertension   ? Hypothyroidism   ? Pancreatic cyst   ? Thyroid disease   ? hypothyroidism  ? ? ?Current Outpatient Medications:  ?  anastrozole (ARIMIDEX) 1 MG tablet, Take 1 tablet (1 mg total) by mouth daily., Disp: 90 tablet, Rfl: 3 ?  Coenzyme Q10-Vitamin E (QUNOL ULTRA COQ10 PO), Take 1 capsule by mouth daily after lunch. , Disp: , Rfl:  ?  Krill Oil 500 MG CAPS, Take 500 mg by mouth daily after lunch. , Disp: , Rfl:  ?  levothyroxine (SYNTHROID) 75 MCG tablet, Take 1 tablet (75 mcg  total) by mouth daily before breakfast., Disp: 90 tablet, Rfl: 3 ?  lisinopril (ZESTRIL) 10 MG tablet, Take 1 tablet (10 mg total) by mouth daily., Disp: 90 tablet, Rfl: 3 ?  lisinopril-hydrochlorothiazide (ZESTORETIC) 20-12.5 MG tablet, Take 1 tablet by mouth daily., Disp: 90 tablet, Rfl: 3 ?  Magnesium 250 MG TABS, Take 250 mg by mouth daily after lunch. , Disp: , Rfl:  ?  Multiple Vitamin (MULTIVITAMIN WITH MINERALS) TABS tablet, Take 1 tablet by mouth daily after lunch. Women's One A Day Multivitamin , Disp: , Rfl:  ?  simvastatin (ZOCOR) 20 MG tablet, Take 1 tablet (20 mg total) by mouth daily., Disp: 90 tablet, Rfl: 3 ?Social History  ? ?Socioeconomic History  ? Marital status: Single  ?  Spouse name: Not on file  ? Number of children: 0  ? Years of education: 71  ? Highest education level: Bachelor's degree (e.g., BA, AB, BS)  ?Occupational History  ? Occupation: retired  ?  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 Smoking status: Never  ? Smokeless tobacco: Never  ?Vaping Use  ? Vaping Use: Never used  ?Substance and Sexual Activity  ? Alcohol use: Yes  ?  Alcohol/week: 1.0 standard drink  ?  Types: 1 Glasses of  wine per week  ?  Comment: occasionally  ? Drug use: Never  ? Sexual activity: Not Currently  ?  Partners: Male  ?  Birth control/protection: Surgical  ?Other Topics Concern  ? Not on file  ?Social History Narrative  ? Lives alone.  No children  ? Her brother and sister live nearby  ? ?Social Determinants of Health  ? ?Financial Resource Strain: Low Risk   ? Difficulty of Paying Living Expenses: Not hard at all  ?Food Insecurity: No Food Insecurity  ? Worried About Charity fundraiser in the Last Year: Never true  ? Ran Out of Food in the Last Year: Never true  ?Transportation Needs: No Transportation Needs  ? Lack of Transportation (Medical): No  ? Lack of Transportation (Non-Medical): No  ?Physical Activity: Sufficiently Active  ? Days of Exercise per Week: 6 days  ? Minutes of Exercise per  Session: 60 min  ?Stress: No Stress Concern Present  ? Feeling of Stress : Not at all  ?Social Connections: Moderately Integrated  ? Frequency of Communication with Friends and Family: More than three times a week  ? Frequency of Social Gatherings with Friends and Family: More than three times a week  ? Attends Religious Services: More than 4 times per year  ? Active Member of Clubs or Organizations: Yes  ? Attends Archivist Meetings: More than 4 times per year  ? Marital Status: Never married  ?Intimate Partner Violence: Not At Risk  ? Fear of Current or Ex-Partner: No  ? Emotionally Abused: No  ? Physically Abused: No  ? Sexually Abused: No  ? ?Family History  ?Problem Relation Age of Onset  ? High Cholesterol Mother   ? Hypertension Mother   ? Goiter Mother   ? High Cholesterol Father   ? Hypertension Father   ? Hypertension Brother   ? Lung disease Brother   ?     chronic smoker  ? Heart disease Brother   ? Goiter Sister   ? Heart disease Brother   ? Hypertension Brother   ? Prostate cancer Brother 30  ? Lung cancer Maternal Uncle   ?     smoker  ? Pancreatic cancer Nephew   ?     dx. 40s/50s  ? Cancer Nephew   ?     pancreatic vs. multiple myeloma; dx. 40s/50s  ? Colon cancer Neg Hx   ? Stomach cancer Neg Hx   ? ? ?Objective: ?Office vital signs reviewed. ?BP (!) 152/84   Pulse 75   Temp (!) 97.5 ?F (36.4 ?C)   Ht 5' 5"  (1.651 m)   Wt 160 lb 6.4 oz (72.8 kg)   SpO2 97%   BMI 26.69 kg/m?  ? ?Physical Examination:  ?General: Awake, alert, well nourished, No acute distress ?MSK: Gait is normal.  Mild pain with Thessaly.  She has about a 10 degree loss of extension of the right knee.  No tenderness palpation to the patella, patellar tendon, quad tendon, medial or lateral joint line.  No tenderness palpation to the popliteal fossa.  No ligamentous laxity. ? ?Assessment/ Plan: ?76 y.o. female  ? ?Injury of meniscus of right knee, subsequent encounter - Plan: Ambulatory referral to Physical  Therapy ? ?Suspect meniscal injury based on exam today.  Mild pain along the right medial knee with Thessaly maneuver.  She did have a mild joint effusion appreciated on exam but range of motion seem to be fairly preserved and she had  no bony tenderness anywhere.  No evidence of ligamentous laxity.  I have referred her to physical therapy for rehabilitation.  If no significant improvement within the next 6 weeks, we will proceed with MRI and referral.  Encouraged her to utilize topical analgesic and or Tylenol arthritis prior to her physical therapy ? ?No orders of the defined types were placed in this encounter. ? ?No orders of the defined types were placed in this encounter. ? ? ? ?Janora Norlander, DO ?[place] ?[phone] ? ?

## 2022-03-31 NOTE — Telephone Encounter (Signed)
lmtcb

## 2022-03-31 NOTE — Telephone Encounter (Signed)
Pt returned missed call. Made pt aware of Dr Alver Sorrow advise per note. Pt voiced understanding.  ?

## 2022-03-31 NOTE — Telephone Encounter (Signed)
Voltaren gel 3x daily if she does not want to use oral meds.  It will help with swelling.  Ice is also a good idea. ?

## 2022-04-01 ENCOUNTER — Telehealth: Payer: Self-pay | Admitting: Family Medicine

## 2022-04-01 NOTE — Telephone Encounter (Signed)
Pt aware of dr g recommendations ?

## 2022-04-01 NOTE — Telephone Encounter (Signed)
Yes, the recommendation is to STRENGTHEN the surrounding muscles so she does not continue to have instability of the knee.   ?

## 2022-04-02 ENCOUNTER — Ambulatory Visit: Payer: Medicare Other | Attending: Family Medicine

## 2022-04-02 DIAGNOSIS — S838X1D Sprain of other specified parts of right knee, subsequent encounter: Secondary | ICD-10-CM | POA: Diagnosis not present

## 2022-04-02 DIAGNOSIS — M25561 Pain in right knee: Secondary | ICD-10-CM | POA: Insufficient documentation
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
## 2022-04-03 ENCOUNTER — Telehealth: Payer: Self-pay | Admitting: Family Medicine

## 2022-04-03 NOTE — Telephone Encounter (Signed)
Tylenol arthritis, Voltaren gel, physical therapy. ?

## 2022-04-03 NOTE — Telephone Encounter (Signed)
Patient aware and verbalizes understanding. 

## 2022-04-07 DIAGNOSIS — M25561 Pain in right knee: Secondary | ICD-10-CM | POA: Diagnosis not present

## 2022-04-09 ENCOUNTER — Ambulatory Visit: Payer: Medicare Other

## 2022-04-09 DIAGNOSIS — M25561 Pain in right knee: Secondary | ICD-10-CM

## 2022-04-09 DIAGNOSIS — S838X1D Sprain of other specified parts of right knee, subsequent encounter: Secondary | ICD-10-CM | POA: Diagnosis not present

## 2022-04-09 NOTE — Therapy (Signed)
[place] ?[facility] ?[address] ?[place], [place], [address] ?Phone: [phone]   Fax:  [phone] ? ?Physical Therapy Treatment ? ?Patient Details  ?Name: Christine Cantrell ?MRN: [id number] ?Date of Birth: 05-21-1946 ?Referring Provider (PT): Lajuana Ripple, DO ? ? ?Encounter Date: 04/09/2022 ? ? PT End of Session - 04/09/22 0951   ? ? Visit Number 2   ? Number of Visits 3   ? Date for PT Re-Evaluation 05/23/22   ? PT Start Time [phone]   ? PT Stop Time 1028   ? PT Time Calculation (min) 41 min   ? Activity Tolerance Patient tolerated treatment well   ? Behavior During Therapy [facility] for tasks assessed/performed   ? ?  ?  ? ?  ? ? ?Past Medical History:  ?Diagnosis Date  ? Abnormal EKG saw dr hochrein 3 yrs ago for  ? hx of left bundle branch block on ekg since 2002  ? Breast cancer ([place])   ? Depression   ? Diverticulosis large intestine w/o perforation or abscess w/bleeding 2016  ? Family history of lung cancer   ? Family history of pancreatic cancer   ? Family history of prostate cancer   ? GERD (gastroesophageal reflux disease)   ? hx of  ? High cholesterol   ? History of hiatal hernia   ? Hx of colonic polyps 2016  ? adenomatous  ? Hypertension   ? Hypothyroidism   ? Pancreatic cyst   ? Thyroid disease   ? hypothyroidism  ? ? ?Past Surgical History:  ?Procedure Laterality Date  ? ABDOMINAL HYSTERECTOMY  2002  ? complete for plyps  ? BREAST LUMPECTOMY WITH RADIOACTIVE SEED LOCALIZATION Right 08/22/2020  ? Procedure: RIGHT BREAST LUMPECTOMY WITH RADIOACTIVE SEED LOCALIZATION;  Surgeon: Stark Klein, MD;  Location: [place];  Service: General;  Laterality: Right;  ? ESOPHAGOGASTRODUODENOSCOPY N/A 09/30/2018  ? Procedure: ESOPHAGOGASTRODUODENOSCOPY (EGD);  Surgeon: Milus Banister, MD;  Location: [facility];  Service: Endoscopy;  Laterality: N/A;  ? EUS N/A 09/30/2018  ? Procedure: UPPER ENDOSCOPIC ULTRASOUND (EUS) RADIAL;  Surgeon: Milus Banister, MD;  [facility];  Service:  Endoscopy;  Laterality: N/A;  ? LIVER BIOPSY    ? normal result  ? thryoid needle biopsy  1990's  ? THYROIDECTOMY  1982  ? ? ?There were no vitals filed for this visit. ? ? Subjective Assessment - 04/09/22 0950   ? ? Subjective Patient reports that her knee still bothers her a little bit when she bends her knee really far, but it is not hurting right now.   ? Limitations Walking   ? Currently in Pain? No/denies   ? Pain Onset 1 to 4 weeks ago   ? ?  ?  ? ?  ? ? ? ? ? ? ? ? ? ? ? ? ? ? ? ? ? ? ? ? [place] Adult PT Treatment/Exercise - 04/09/22 0001   ? ?  ? Knee/Hip Exercises: Aerobic  ? Recumbent Bike L1 x 11 minutes   ?  ? Knee/Hip Exercises: Machines for Strengthening  ? Cybex Knee Flexion 30# x 2 minutes   ?  ? Knee/Hip Exercises: Standing  ? Hip Flexion Both;Knee bent   2 minutes; on foam pad  ? Forward Lunges Both;20 reps   onto 6" step  ? Rocker Board 3 minutes   ?  ? Knee/Hip Exercises: Seated  ? Long [employer] Both;2 sets;15 reps;Weights   ? Long Arc Quad Weight 5 lbs.   ?  Sit to Sand 20 reps;without UE support   ?  ? Manual Therapy  ? Manual Therapy Manual Traction   ? Manual Traction tibiofemoral distraction (seated)   ? ?  ?  ? ?  ? ? ? ? ? ? ? ? ? ? ? ? ? ? ? PT Long Term Goals - 04/02/22 1324   ? ?  ? PT LONG TERM GOAL #1  ? Title Patient will be independent with her HEP.   ? Time 3   ? Period Weeks   ? Status New   ? Target Date 04/23/22   ?  ? PT LONG TERM GOAL #2  ? Title Patient will be able to walk at least 30 minutes without being limited by her familiar knee pain.   ? Time 3   ? Period Weeks   ? Status New   ? Target Date 04/23/22   ?  ? PT LONG TERM GOAL #3  ? Title Patient will be able to complete her household activities without her familiar knee pain exceeding 2/10.   ? Time 3   ? Period Weeks   ? Status New   ? Target Date 04/23/22   ? ?  ?  ? ?  ? ? ? ? ? ? ? ? Plan - 04/09/22 0951   ? ? Clinical Impression Statement Patient was introduced to multiple new interventions for improved lower  extremity strength and mobility. She required moderate cuing throughtout treatment for slow and controlled movement as she attempted to rush through today's interventions. Manual therapy focused on tibiofemoral distraction which was able to slightly reduce her familiar symptoms. She reported feeling ok upon the conclusion of treatment. She continues to require skilled physical therapy to address her remaining impairments to return to her prior level of function.   ? Personal Factors and Comorbidities Comorbidity 2   ? Comorbidities HTN, history of cancer   ? Examination-Activity Limitations Locomotion Level   ? Examination-Participation Restrictions Cleaning;Yard Work;Shop   ? Stability/Clinical Decision Making Stable/Uncomplicated   ? Rehab Potential Excellent   ? PT Frequency 1x / week   ? PT Duration 3 weeks   ? PT Treatment/Interventions Neuromuscular re-education;Therapeutic exercise;Therapeutic activities;Patient/family education;Manual techniques;Vasopneumatic Device;Functional mobility training   ? PT Next Visit Plan recumbent bike, lower extremity strengthening   ? PT Home Exercise Plan Access Code: [email or web address]  URL: [email or web address]  Date: 04/02/2022  Prepared by: Jacqulynn Cadet    Exercises  - Supine Active Straight Leg Raise  - 2 x daily - 7 x weekly - 2 sets - 10 reps  - Supine Bridge  - 2 x daily - 7 x weekly - 2 sets - 10 reps  - Sit to Stand  - 2 x daily - 7 x weekly - 2 sets - 10 reps  - Heel Raises with Counter Support  - 2 x daily - 7 x weekly - 2 sets - 10 reps   ? Consulted and Agree with Plan of Care Patient   ? ?  ?  ? ?  ? ? ?Patient will benefit from skilled therapeutic intervention in order to improve the following deficits and impairments:  Difficulty walking, Decreased activity tolerance, Pain, Decreased strength, Decreased mobility ? ?Visit Diagnosis: ?Acute pain of right knee ? ? ? ? ?Problem List ?Patient Active Problem List  ? Diagnosis Date Noted  ? Family history of  prostate cancer   ? Family history of lung cancer   ? Family history of pancreatic  cancer   ? Malignant neoplasm of upper-inner quadrant of right breast in female, estrogen receptor positive ([place]) 07/06/2020  ? Postoperative hypothyroidism 09/17/2018  ? Pancreatic cyst 09/07/2018  ? Sarcoidosis of digestive system 06/25/2018  ? Elevated fasting blood sugar 09/15/2017  ? Healthcare maintenance 12/11/2015  ? HTN (hypertension) 09/07/2015  ? HLD (hyperlipidemia) 09/07/2015  ? Multiple thyroid nodules 09/07/2015  ? ? ?Darlin Coco, PT ?04/09/2022, 1:12 PM ? ?[place] ?[facility] ?[address] ?[place], [place], [address] ?Phone: [phone]   Fax:  [phone] ? ?Name: Christine Cantrell ?MRN: [id number] ?Date of Birth: Feb 14, 1946 ? ? ? ?

## 2022-04-16 ENCOUNTER — Ambulatory Visit: Payer: Medicare Other

## 2022-04-16 DIAGNOSIS — M25561 Pain in right knee: Secondary | ICD-10-CM | POA: Diagnosis not present

## 2022-04-16 DIAGNOSIS — S838X1D Sprain of other specified parts of right knee, subsequent encounter: Secondary | ICD-10-CM | POA: Diagnosis not present

## 2022-04-16 NOTE — Therapy (Signed)
?[facility] ?[address] ?[place], [place], [address] ?Phone: [phone]   Fax:  [phone] ? ?Physical Therapy Treatment ? ?Patient Details  ?Name: Christine Cantrell ?MRN: [id number] ?Date of Birth: [birth year] ?Referring Provider (PT): Lajuana Ripple, DO ? ? ?Encounter Date: 04/16/2022 ? ? PT End of Session - 04/16/22 0955   ? ? Visit Number 3   ? Number of Visits 3   ? Date for PT Re-Evaluation 05/23/22   ? PT Start Time 2585   ? PT Stop Time 1017   ? PT Time Calculation (min) 32 min   ? Activity Tolerance Patient tolerated treatment well   ? Behavior During Therapy [facility] for tasks assessed/performed   ? ?  ?  ? ?  ? ? ?Past Medical History:  ?Diagnosis Date  ? Abnormal EKG saw dr hochrein 3 yrs ago for  ? hx of left bundle branch block on ekg since 2002  ? Breast cancer ([place])   ? Depression   ? Diverticulosis large intestine w/o perforation or abscess w/bleeding 2016  ? Family history of lung cancer   ? Family history of pancreatic cancer   ? Family history of prostate cancer   ? GERD (gastroesophageal reflux disease)   ? hx of  ? High cholesterol   ? History of hiatal hernia   ? Hx of colonic polyps 2016  ? adenomatous  ? Hypertension   ? Hypothyroidism   ? Pancreatic cyst   ? Thyroid disease   ? hypothyroidism  ? ? ?Past Surgical History:  ?Procedure Laterality Date  ? ABDOMINAL HYSTERECTOMY  2002  ? complete for plyps  ? BREAST LUMPECTOMY WITH RADIOACTIVE SEED LOCALIZATION Right 08/22/2020  ? Procedure: RIGHT BREAST LUMPECTOMY WITH RADIOACTIVE SEED LOCALIZATION;  Surgeon: Stark Klein, MD;  Location: [place];  Service: General;  Laterality: Right;  ? ESOPHAGOGASTRODUODENOSCOPY N/A 09/30/2018  ? Procedure: ESOPHAGOGASTRODUODENOSCOPY (EGD);  Surgeon: Milus Banister, MD;  Location: [facility];  Service: Endoscopy;  Laterality: N/A;  ? EUS N/A 09/30/2018  ? Procedure: UPPER ENDOSCOPIC ULTRASOUND (EUS) RADIAL;  Surgeon: Milus Banister, MD;  [facility];  Service:  Endoscopy;  Laterality: N/A;  ? LIVER BIOPSY    ? normal result  ? thryoid needle biopsy  1990's  ? THYROIDECTOMY  1982  ? ? ?There were no vitals filed for this visit. ? ? Subjective Assessment - 04/16/22 0948   ? ? Subjective Patient reports that her right knee is bothering her today. She notes that her left knee is feeling better, but her right knee started bothering her about 4-5 days ago.   ? Limitations Walking   ? Currently in Pain? No/denies   ? Pain Onset 1 to 4 weeks ago   ? Pain Frequency Intermittent   ? ?  ?  ? ?  ? ? ? ? ? ? ? ? ? ? ? ? ? ? ? ? ? ? ? ? [place] Adult PT Treatment/Exercise - 04/16/22 0001   ? ?  ? Knee/Hip Exercises: Aerobic  ? Recumbent Bike L3 x15 minutes   ?  ? Knee/Hip Exercises: Machines for Strengthening  ? Cybex Knee Flexion 30# x 2 minutes   ?  ? Knee/Hip Exercises: Standing  ? Other Standing Knee Exercises Heel/toe raises   2 minutes  ?  ? Knee/Hip Exercises: Seated  ? Long [employer] Both;2 sets;15 reps;Weights   ? Long Arc Quad Weight 5 lbs.   ? Sit to Sand 20 reps;without UE support   ? ?  ?  ? ?  ? ? ? ? ? ? ? ? ? ? ? ? ? ? ?  PT Long Term Goals - 04/16/22 1002   ? ?  ? PT LONG TERM GOAL #1  ? Title Patient will be independent with her HEP.   ? Time 3   ? Period Weeks   ? Status Achieved   ? Target Date 04/23/22   ?  ? PT LONG TERM GOAL #2  ? Title Patient will be able to walk at least 30 minutes without being limited by her familiar knee pain.   ? Time 3   ? Period Weeks   ? Status Achieved   ? Target Date 04/23/22   ?  ? PT LONG TERM GOAL #3  ? Title Patient will be able to complete her household activities without her familiar knee pain exceeding 2/10.   ? Time 3   ? Period Weeks   ? Status Achieved   ? Target Date 04/23/22   ? ?  ?  ? ?  ? ? ? ? ? ? ? ? Plan - 04/16/22 0955   ? ? Clinical Impression Statement Patient presented to treatment reporting that she had met all of her goals for physical therapy and wished to be discharged from therapy. Her HEP was reviewed and she  reported feeling comfortable with these interventions. She required minimal cuing for a slower pace to facilitate increased muscular demand. She reported feeling good and was ready to be discharged at this time.   ? Personal Factors and Comorbidities Comorbidity 2   ? Comorbidities HTN, history of cancer   ? Examination-Activity Limitations Locomotion Level   ? Examination-Participation Restrictions Cleaning;Yard Work;Shop   ? Stability/Clinical Decision Making Stable/Uncomplicated   ? Rehab Potential Excellent   ? PT Frequency 1x / week   ? PT Duration 3 weeks   ? PT Treatment/Interventions Neuromuscular re-education;Therapeutic exercise;Therapeutic activities;Patient/family education;Manual techniques;Vasopneumatic Device;Functional mobility training   ? PT Next Visit Plan recumbent bike, lower extremity strengthening   ? PT Home Exercise Plan Access Code: [email or web address]  URL: [email or web address]  Date: 04/02/2022  Prepared by: Jacqulynn Cadet    Exercises  - Supine Active Straight Leg Raise  - 2 x daily - 7 x weekly - 2 sets - 10 reps  - Supine Bridge  - 2 x daily - 7 x weekly - 2 sets - 10 reps  - Sit to Stand  - 2 x daily - 7 x weekly - 2 sets - 10 reps  - Heel Raises with Counter Support  - 2 x daily - 7 x weekly - 2 sets - 10 reps   ? Consulted and Agree with Plan of Care Patient   ? ?  ?  ? ?  ? ? ?Patient will benefit from skilled therapeutic intervention in order to improve the following deficits and impairments:  Difficulty walking, Decreased activity tolerance, Pain, Decreased strength, Decreased mobility ? ?Visit Diagnosis: ?Acute pain of right knee ? ? ? ? ?Problem List ?Patient Active Problem List  ? Diagnosis Date Noted  ? Family history of prostate cancer   ? Family history of lung cancer   ? Family history of pancreatic cancer   ? Malignant neoplasm of upper-inner quadrant of right breast in female, estrogen receptor positive ([place]) 07/06/2020  ? Postoperative hypothyroidism 09/17/2018  ?  Pancreatic cyst 09/07/2018  ? Sarcoidosis of digestive system 06/25/2018  ? Elevated fasting blood sugar 09/15/2017  ? Healthcare maintenance 12/11/2015  ? HTN (hypertension) 09/07/2015  ? HLD (hyperlipidemia) 09/07/2015  ? Multiple thyroid nodules 09/07/2015  ? ? ?Lamar Benes  Oswaldo Conroy, PT ?04/16/2022, 10:34 AM ? ?[place] ?[facility] ?[address] ?[place], [place], [address] ?Phone: [phone]   Fax:  [phone] ? ?Name: Christine Cantrell ?MRN: [id number] ?Date of Birth: 09/24/1946 ? ?PHYSICAL THERAPY DISCHARGE SUMMARY ? ?Visits from Start of Care: 3 ? ?Current functional level related to goals / functional outcomes: ?Patient has met all of her goals for physical therapy.  ?  ?Remaining deficits: ?None  ?  ?Education / Equipment: ?HEP  ? ?Patient agrees to discharge. Patient goals were met. Patient is being discharged due to meeting the stated rehab goals.  ? ?

## 2022-05-08 ENCOUNTER — Telehealth: Payer: Self-pay | Admitting: Hematology and Oncology

## 2022-05-08 NOTE — Telephone Encounter (Signed)
Rescheduled appointment per provider PAL. Left message. 

## 2022-05-12 ENCOUNTER — Encounter: Payer: Self-pay | Admitting: Family Medicine

## 2022-05-12 ENCOUNTER — Ambulatory Visit (INDEPENDENT_AMBULATORY_CARE_PROVIDER_SITE_OTHER): Payer: Medicare Other | Admitting: Family Medicine

## 2022-05-12 VITALS — BP 124/67 | HR 106 | Temp 98.2°F | Ht 65.0 in | Wt 158.0 lb

## 2022-05-12 DIAGNOSIS — S838X1D Sprain of other specified parts of right knee, subsequent encounter: Secondary | ICD-10-CM | POA: Diagnosis not present

## 2022-05-12 NOTE — Progress Notes (Signed)
? ?Subjective: ?CC: Follow-up right knee pain ?PCP: Christine Norlander, DO ?OXB:DZHGD Upadhyay is a 76 y.o. female presenting to clinic today for: ? ?1.  Right knee pain ?Patient was here 6 weeks ago for right knee pain.  She notes that since she has gone through physical therapy symptoms have gotten better.  They are not completely gone but they are certainly better.  She does not feel need for MRI nor referral at this time.  Does not need any medications prescribed as symptoms seem to be fairly well controlled. ? ? ?ROS: Per HPI ? ?No Known Allergies ?Past Medical History:  ?Diagnosis Date  ? Abnormal EKG saw dr hochrein 3 yrs ago for  ? hx of left bundle branch block on ekg since 2002  ? Breast cancer ([place])   ? Depression   ? Diverticulosis large intestine w/o perforation or abscess w/bleeding 2016  ? Family history of lung cancer   ? Family history of pancreatic cancer   ? Family history of prostate cancer   ? GERD (gastroesophageal reflux disease)   ? hx of  ? High cholesterol   ? History of hiatal hernia   ? Hx of colonic polyps 2016  ? adenomatous  ? Hypertension   ? Hypothyroidism   ? Pancreatic cyst   ? Thyroid disease   ? hypothyroidism  ? ? ?Current Outpatient Medications:  ?  anastrozole (ARIMIDEX) 1 MG tablet, Take 1 tablet (1 mg total) by mouth daily., Disp: 90 tablet, Rfl: 3 ?  Coenzyme Q10-Vitamin E (QUNOL ULTRA COQ10 PO), Take 1 capsule by mouth daily after lunch. , Disp: , Rfl:  ?  Krill Oil 500 MG CAPS, Take 500 mg by mouth daily after lunch. , Disp: , Rfl:  ?  levothyroxine (SYNTHROID) 75 MCG tablet, Take 1 tablet (75 mcg total) by mouth daily before breakfast., Disp: 90 tablet, Rfl: 3 ?  lisinopril (ZESTRIL) 10 MG tablet, Take 1 tablet (10 mg total) by mouth daily., Disp: 90 tablet, Rfl: 3 ?  lisinopril-hydrochlorothiazide (ZESTORETIC) 20-12.5 MG tablet, Take 1 tablet by mouth daily., Disp: 90 tablet, Rfl: 3 ?  Magnesium 250 MG TABS, Take 250 mg by mouth daily after lunch. , Disp: , Rfl:  ?   Multiple Vitamin (MULTIVITAMIN WITH MINERALS) TABS tablet, Take 1 tablet by mouth daily after lunch. Women's One A Day Multivitamin , Disp: , Rfl:  ?  simvastatin (ZOCOR) 20 MG tablet, Take 1 tablet (20 mg total) by mouth daily., Disp: 90 tablet, Rfl: 3 ?Social History  ? ?Socioeconomic History  ? Marital status: Single  ?  Spouse name: Not on file  ? Number of children: 0  ? Years of education: 55  ? Highest education level: Bachelor's degree (e.g., BA, AB, BS)  ?Occupational History  ? Occupation: retired  ?  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 Smoking status: Never  ? Smokeless tobacco: Never  ?Vaping Use  ? Vaping Use: Never used  ?Substance and Sexual Activity  ? Alcohol use: Yes  ?  Alcohol/week: 1.0 standard drink  ?  Types: 1 Glasses of wine per week  ?  Comment: occasionally  ? Drug use: Never  ? Sexual activity: Not Currently  ?  Partners: Male  ?  Birth control/protection: Surgical  ?Other Topics Concern  ? Not on file  ?Social History Narrative  ? Lives alone.  No children  ? Her brother and sister live nearby  ? ?Social Determinants of Health  ? ?Emergency planning/management officer  Strain: Low Risk   ? Difficulty of Paying Living Expenses: Not hard at all  ?Food Insecurity: No Food Insecurity  ? Worried About Charity fundraiser in the Last Year: Never true  ? Ran Out of Food in the Last Year: Never true  ?Transportation Needs: No Transportation Needs  ? Lack of Transportation (Medical): No  ? Lack of Transportation (Non-Medical): No  ?Physical Activity: Sufficiently Active  ? Days of Exercise per Week: 6 days  ? Minutes of Exercise per Session: 60 min  ?Stress: No Stress Concern Present  ? Feeling of Stress : Not at all  ?Social Connections: Moderately Integrated  ? Frequency of Communication with Friends and Family: More than three times a week  ? Frequency of Social Gatherings with Friends and Family: More than three times a week  ? Attends Religious Services: More than 4 times per year  ? Active Member of  Clubs or Organizations: Yes  ? Attends Archivist Meetings: More than 4 times per year  ? Marital Status: Never married  ?Intimate Partner Violence: Not At Risk  ? Fear of Current or Ex-Partner: No  ? Emotionally Abused: No  ? Physically Abused: No  ? Sexually Abused: No  ? ?Family History  ?Problem Relation Age of Onset  ? High Cholesterol Mother   ? Hypertension Mother   ? Goiter Mother   ? High Cholesterol Father   ? Hypertension Father   ? Hypertension Brother   ? Lung disease Brother   ?     chronic smoker  ? Heart disease Brother   ? Goiter Sister   ? Heart disease Brother   ? Hypertension Brother   ? Prostate cancer Brother 60  ? Lung cancer Maternal Uncle   ?     smoker  ? Pancreatic cancer Nephew   ?     dx. 40s/50s  ? Cancer Nephew   ?     pancreatic vs. multiple myeloma; dx. 40s/50s  ? Colon cancer Neg Hx   ? Stomach cancer Neg Hx   ? ? ?Objective: ?Office vital signs reviewed. ?BP 124/67   Pulse (!) 106   Temp 98.2 ?F (36.8 ?C)   Ht 5' 5"  (1.651 m)   Wt 158 lb (71.7 kg)   SpO2 98%   BMI 26.29 kg/m?  ? ?Physical Examination:  ?General: Awake, alert, well nourished, No acute distress ?MSK: Right knee pain with small soft tissue swelling but no significant joint effusion, erythema.  Mild warmth present.  Left knee.  She has full active range of motion.  Ambulating independently ? ?Assessment/ Plan: ?76 y.o. female  ? ?Injury of meniscus of right knee, subsequent encounter ? ?Doing well status post physical therapy.  Follow-up if recurrent pain occurs and then we can consider MRI and referral. ? ?No orders of the defined types were placed in this encounter. ? ?No orders of the defined types were placed in this encounter. ? ? ? ?Christine Norlander, DO ?[place] ?[phone] ? ?

## 2022-05-13 ENCOUNTER — Encounter: Payer: Self-pay | Admitting: Gastroenterology

## 2022-06-03 ENCOUNTER — Ambulatory Visit: Payer: Medicare Other | Admitting: Hematology and Oncology

## 2022-06-13 ENCOUNTER — Ambulatory Visit: Payer: Medicare Other | Admitting: Nurse Practitioner

## 2022-06-13 ENCOUNTER — Encounter: Payer: Self-pay | Admitting: Nurse Practitioner

## 2022-06-13 VITALS — BP 128/82 | HR 82 | Ht 66.0 in | Wt 160.2 lb

## 2022-06-13 DIAGNOSIS — Z86010 Personal history of colonic polyps: Secondary | ICD-10-CM | POA: Diagnosis not present

## 2022-06-13 NOTE — Patient Instructions (Addendum)
If you are age 78 or older, your body mass index should be between 23-30. Your Body mass index is 25.86 kg/m. If this is out of the aforementioned range listed, please consider follow up with your Primary Care Provider. ________________________________________________________  The [facility] providers would like to encourage you to use [facility] to communicate with providers for non-urgent requests or questions.  Due to long hold times on the telephone, sending your provider a message by [facility] may be a faster and more efficient way to get a response.  Please allow 48 business hours for a response.  Please remember that this is for non-urgent requests.  _______________________________________________________  Call Dr. Doyne Keel nurse [place] the beginning of November if you have not been scheduled for the MRI to follow up on pancreatic cyst.  Thank you for entrusting me with your care and choosing [facility].  Tye Savoy, NP

## 2022-06-13 NOTE — Progress Notes (Signed)
Assessment   Patient profile:  Christine Cantrell is a 76 y.o. female with a past medical history significant for adenomatous colon polyps, pancreatic cyst, hypothyroidism. See PMH below for any additional history. Patient is known to Dr. Havery Moros.    History of adenomatous colon polyps.   A small left-sided adenoma was removed at time of last colonoscopy in 2016.  We put her on for a 7-year recall. She has no alarm symptoms. She understands that without a colonoscopy there is no way to know whether she has developed recurrent polyps. However, unless Dr. Havery Moros feels strongly about proceeding she would prefer to forgo the surveillance colonoscopy.    Pancreatic cyst. Stable. Undergoing surveillance imaging.   Plan   Patient will let us know if she develops bowel changes, blood in stool or other alarm features Needs one year interval MRI/MRCP in November 2022.  Most likely Dr. Doyne Keel nurse has her on a recall list but I told patient to contact our office if she had not heard from [place] by early November   History of Present Illness   Chief complaint: none. Here to discuss surveillance colonoscopy   11/15/19 - last office visit.  Below is summary of note She has a known pancreatic cyst and has undergone EUS with FNA and gets serial imaging. Cyst unchanged on last MRI / MRCP in Nove 2022.   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 Would continue to watch this.  History of colon polyps - given the recent change in guidelines will change her next surveillance colonoscopy to March 2023 at that time she will be 76 years old.  We discussed if she would want further surveillance at that age.  She is otherwise in good health, she may want to consider one additional exam.  We can talk about it at that time.   Follow up MRI/ MRCP in November 2022 showed that the cyst in her pancreas was  stable without interval changes. Recommended MRCP in 1 year to continue to survey this.    Interval History:  She hasn't had any bowel changes. No blood in stool. Her weight is stable. She has been walking 3-4 miles most days. She lives alone and prefers not to undergo the another colonoscopy. Hgb 14.8 in August 2022.   Zesteril and Zestorectic on home med list?  She isn't sure if taking both. We discussed and she will clarify if supposed to be on both.   Previous Labs     Latest Ref Rng & Units 07/31/2021    9:00 AM 08/16/2020    9:42 AM 07/11/2020    7:33 AM  CMP  Glucose 65 - 99 mg/dL 116  114  198   BUN 8 - 27 mg/dL 13  15  14    Creatinine 0.57 - 1.00 mg/dL 0.69  0.63  0.85   Sodium 134 - 144 mmol/L 135  129  132   Potassium 3.5 - 5.2 mmol/L 4.6  4.2  3.9   Chloride 96 - 106 mmol/L 95  97  95   CO2 20 - 29 mmol/L 23  23  25    Calcium 8.7 - 10.3 mg/dL 9.6  9.1  9.8   Total Protein 6.0 - 8.5 g/dL 6.8   7.7   Total Bilirubin 0.0 - 1.2 mg/dL 0.9   1.6   Alkaline Phos 44 - 121 IU/L 33  32   AST 0 - 40 IU/L 30   39   ALT 0 - 32 IU/L 26   40    Previous GI Evaluations   Endoscopies: March 2016 screening colonoscopy at outside facility --3 mm sessile polyp and a 5 mm sessile polyp removed in the left colon. One polyp was adenomatous, the other hyperplastic.     Imaging:  MR 3D Recon At Scanner CLINICAL DATA:  Cystic pancreatic lesion, surveillance/follow-up  EXAM: MRI ABDOMEN WITHOUT AND WITH CONTRAST (INCLUDING MRCP)  TECHNIQUE: Multiplanar multisequence MR imaging of the abdomen was performed both before and after the administration of intravenous contrast. Heavily T2-weighted images of the biliary and pancreatic ducts were obtained, and three-dimensional MRCP images were rendered by post processing.  CONTRAST:  15m GADAVIST GADOBUTROL 1 MMOL/ML IV SOLN  COMPARISON:  Multiple priors including most recent MRI November 13, 2020  FINDINGS: Lower chest: No acute  abnormality. Lumpectomy site in the right breast.  Hepatobiliary: No hepatic steatosis. No suspicious hepatic lesion. Gallbladder is unremarkable. No biliary ductal dilation.  Pancreas: Well-circumscribed T2 hyperintense lesion along the posterior margin of the pancreatic head measures 2.6 x 2.0 x 1.2 cm (volume = 3.3 cm^3) on image 24/2 and 13/10 previously 2.7 x 1.9 x 1.6 cm (volume = 4.3 cm^3). The lesion demonstrates ductal communication, without suspicious wall/septal thickening or postcontrast enhancement. No main pancreatic ductal dilation. No additional pancreatic lesions visualized. No evidence of acute pancreatic inflammation.  Spleen:  Within normal limits.  Adrenals/Urinary Tract: Bilateral adrenal glands are unremarkable. No hydronephrosis. No solid enhancing renal mass.  Stomach/Bowel: Small hiatal hernia. Visualized portions within the abdomen are unremarkable.  Vascular/Lymphatic: No abdominal aortic aneurysm. The portal, splenic and superior mesenteric veins are patent. No pathologically enlarged abdominal or pelvic lymph nodes.  Other:  No abdominal ascites.  Musculoskeletal: No suspicious bone lesions identified.  IMPRESSION: Cystic 2.6 cm lesion along the posterior margin of the pancreatic head, is stable dating back to May 17, 2018, without suspicious MRI features. 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 Recommend follow up pre and post contrast MRI/MRCP in 1 year. This recommendation follows ACR consensus guidelines: Management of Incidental Pancreatic Cysts: A White Paper of the ACR Incidental Findings Committee. [id number];51:884-166  Electronically Signed   By: JDahlia BailiffM.D.   On: 11/14/2021 09:44 MR ABDOMEN MRCP W WO CONTAST CLINICAL DATA:  Cystic pancreatic lesion, surveillance/follow-up  EXAM: MRI ABDOMEN WITHOUT AND WITH CONTRAST (INCLUDING  MRCP)  TECHNIQUE: Multiplanar multisequence MR imaging of the abdomen was performed both before and after the administration of intravenous contrast. Heavily T2-weighted images of the biliary and pancreatic ducts were obtained, and three-dimensional MRCP images were rendered by post processing.  CONTRAST:  743mGADAVIST GADOBUTROL 1 MMOL/ML IV SOLN  COMPARISON:  Multiple priors including most recent MRI November 13, 2020  FINDINGS: Lower chest: No acute abnormality. Lumpectomy site in the right breast.  Hepatobiliary: No hepatic steatosis. No suspicious hepatic lesion. Gallbladder is unremarkable. No biliary ductal dilation.  Pancreas: Well-circumscribed T2 hyperintense lesion along the posterior margin of the pancreatic head measures 2.6 x 2.0 x 1.2 cm (volume = 3.3 cm^3) on image 24/2 and 13/10 previously 2.7 x 1.9 x 1.6 cm (volume = 4.3 cm^3). The lesion demonstrates ductal communication, without suspicious wall/septal thickening or postcontrast enhancement. No main pancreatic ductal dilation. No additional pancreatic lesions visualized. No evidence of acute pancreatic inflammation.  Spleen:  Within normal  limits.  Adrenals/Urinary Tract: Bilateral adrenal glands are unremarkable. No hydronephrosis. No solid enhancing renal mass.  Stomach/Bowel: Small hiatal hernia. Visualized portions within the abdomen are unremarkable.  Vascular/Lymphatic: No abdominal aortic aneurysm. The portal, splenic and superior mesenteric veins are patent. No pathologically enlarged abdominal or pelvic lymph nodes.  Other:  No abdominal ascites.  Musculoskeletal: No suspicious bone lesions identified.  IMPRESSION: Cystic 2.6 cm lesion along the posterior margin of the pancreatic head, is stable dating back to May 17, 2018, without suspicious MRI features. 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 Recommend follow up pre  and post contrast MRI/MRCP in 1 year. This recommendation follows ACR consensus guidelines: Management of Incidental Pancreatic Cysts: A White Paper of the ACR Incidental Findings Committee. [place] 2778;24:235-361.  Electronically Signed   By: Dahlia Bailiff M.D.   On: 11/14/2021 09:44    Past Medical History:  Diagnosis Date   Abnormal EKG saw dr hochrein 3 yrs ago for   hx of left bundle branch block on ekg since 2002   Breast cancer ([place])    Depression    Diverticulosis large intestine w/o perforation or abscess w/bleeding 2016   Family history of lung cancer    Family history of pancreatic cancer    Family history of prostate cancer    GERD (gastroesophageal reflux disease)    hx of   High cholesterol    History of hiatal hernia    Hx of colonic polyps 2016   adenomatous   Hypertension    Hypothyroidism    Pancreatic cyst    Thyroid disease    hypothyroidism   Past Surgical History:  Procedure Laterality Date   ABDOMINAL HYSTERECTOMY  2002   complete for plyps   BREAST LUMPECTOMY WITH RADIOACTIVE SEED LOCALIZATION Right 08/22/2020   Procedure: RIGHT BREAST LUMPECTOMY WITH RADIOACTIVE SEED LOCALIZATION;  Surgeon: Stark Klein, MD;  Location: [facility];  Service: General;  Laterality: Right;   ESOPHAGOGASTRODUODENOSCOPY N/A 09/30/2018   Procedure: ESOPHAGOGASTRODUODENOSCOPY (EGD);  Surgeon: Milus Banister, MD;  Location: [facility];  Service: Endoscopy;  Laterality: N/A;   EUS N/A 09/30/2018   Procedure: UPPER ENDOSCOPIC ULTRASOUND (EUS) RADIAL;  Surgeon: Milus Banister, MD;  Location: [facility];  Service: Endoscopy;  Laterality: N/A;   LIVER BIOPSY     normal result   thryoid needle biopsy  1990's   THYROIDECTOMY  1982   Family History  Problem Relation Age of Onset   High Cholesterol Mother    Hypertension Mother    Goiter Mother    High Cholesterol Father    Hypertension Father    Hypertension Brother    Lung disease Brother        chronic  smoker   Heart disease Brother    Goiter Sister    Heart disease Brother    Hypertension Brother    Prostate cancer Brother 82   Lung cancer Maternal Uncle        smoker   Pancreatic cancer Nephew        dx. 40s/50s   Cancer Nephew        pancreatic vs. multiple myeloma; dx. 40s/50s   Colon cancer Neg Hx    Stomach cancer Neg Hx    Social History   Tobacco Use   Smoking status: Never   Smokeless tobacco: Never  Vaping Use   Vaping Use: Never used  Substance Use Topics   Alcohol use: Yes  Alcohol/week: 1.0 standard drink of alcohol    Types: 1 Glasses of wine per week    Comment: occasionally   Drug use: Never   Current Outpatient Medications  Medication Sig Dispense Refill   anastrozole (ARIMIDEX) 1 MG tablet Take 1 tablet (1 mg total) by mouth daily. 90 tablet 3   Coenzyme Q10-Vitamin E (QUNOL ULTRA COQ10 PO) Take 1 capsule by mouth daily after lunch.      Krill Oil 500 MG CAPS Take 500 mg by mouth daily after lunch.      levothyroxine (SYNTHROID) 75 MCG tablet Take 1 tablet (75 mcg total) by mouth daily before breakfast. 90 tablet 3   lisinopril (ZESTRIL) 10 MG tablet Take 1 tablet (10 mg total) by mouth daily. 90 tablet 3   lisinopril-hydrochlorothiazide (ZESTORETIC) 20-12.5 MG tablet Take 1 tablet by mouth daily. 90 tablet 3   Magnesium 250 MG TABS Take 250 mg by mouth daily after lunch.      Multiple Vitamin (MULTIVITAMIN WITH MINERALS) TABS tablet Take 1 tablet by mouth daily after lunch. Women's One A Day Multivitamin      simvastatin (ZOCOR) 20 MG tablet Take 1 tablet (20 mg total) by mouth daily. 90 tablet 3   No current facility-administered medications for this visit.   No Known Allergies   Review of Systems: No chest pain. No SOB. No abdominal pain. No unintentional weight loss. Marland Kitchen   Physical Exam   Wt Readings from Last 3 Encounters:  05/12/22 158 lb (71.7 kg)  03/31/22 160 lb 6.4 oz (72.8 kg)  03/17/22 162 lb 3.2 oz (73.6 kg)    BP 128/82    Pulse 82   Ht 5' 6"  (1.676 m)   Wt 160 lb 3.2 oz (72.7 kg)   SpO2 98%   BMI 25.86 kg/m  Constitutional:  Generally well appearing female in no acute distress. Psychiatric: Pleasant. Normal mood and affect. Behavior is normal. EENT: Pupils normal.  Conjunctivae are normal. No scleral icterus. Neck supple.  Cardiovascular: Normal rate, regular rhythm. No edema Pulmonary/chest: Effort normal and breath sounds normal. No wheezing, rales or rhonchi. Abdominal: Soft, nondistended, nontender. Bowel sounds active throughout. There are no masses palpable. No hepatomegaly. Neurological: Alert and oriented to person place and time. Skin: Skin is warm and dry. No rashes noted.  Tye Savoy, NP  06/13/2022, 9:12 AM

## 2022-06-13 NOTE — Progress Notes (Signed)
Agree with assessment and plan as outlined.  At her age, given she had only 1 small adenoma on her last exam, if her preference is to forego further surveillance colonoscopy that is not unreasonable, as many patients stop having routine surveillance after the age of 21.  Given no high risk lesions I think that is reasonable.  If she changes her mind moving forward she can always let us know to schedule.

## 2022-06-25 ENCOUNTER — Inpatient Hospital Stay: Payer: Medicare Other | Admitting: Hematology and Oncology

## 2022-06-25 DIAGNOSIS — Z853 Personal history of malignant neoplasm of breast: Secondary | ICD-10-CM | POA: Diagnosis not present

## 2022-07-08 ENCOUNTER — Other Ambulatory Visit: Payer: Self-pay

## 2022-07-08 ENCOUNTER — Inpatient Hospital Stay: Payer: Medicare Other | Attending: Hematology and Oncology | Admitting: Hematology and Oncology

## 2022-07-08 DIAGNOSIS — Z79890 Hormone replacement therapy (postmenopausal): Secondary | ICD-10-CM | POA: Insufficient documentation

## 2022-07-08 DIAGNOSIS — Z170 Estrogen receptor positive status [ER+]: Secondary | ICD-10-CM | POA: Insufficient documentation

## 2022-07-08 DIAGNOSIS — C50211 Malignant neoplasm of upper-inner quadrant of right female breast: Secondary | ICD-10-CM | POA: Diagnosis not present

## 2022-07-08 DIAGNOSIS — Z79899 Other long term (current) drug therapy: Secondary | ICD-10-CM | POA: Diagnosis not present

## 2022-07-08 DIAGNOSIS — Z79811 Long term (current) use of aromatase inhibitors: Secondary | ICD-10-CM | POA: Diagnosis not present

## 2022-07-08 MED ORDER — ANASTROZOLE 1 MG PO TABS: 1.0000 mg | ORAL_TABLET | Freq: Every day | ORAL | 3 refills | Status: DC

## 2022-07-08 NOTE — Assessment & Plan Note (Signed)
07/06/2020:Screening mammogram detected a 0.8cm right breast mass. Diagnostic mammogram showed the mass measuring 0.7cm, with no right axillary adenopathy. 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 T1BN0 stage Ia  08/22/2020:Right lumpectomy ([place]): IDC, 1.1cm, grade 2, clear margins.ER 95%, PR 95%, Ki-67 30%, IHC equivocal, FISH negative Refused radiation therapy.  Treatment plan: adjuvant antiestrogen therapywith anastrozole 1 mg daily x5 years started 09/04/2020  Anastrozole Toxicities: Denies any hot flashes or arthralgias or myalgias.  Breast Cancer Surveillance: 1. Breast Exam 07/08/2022: Benign 2. Mammogram: 06/25/2022 and [facility]: Benign  Return to clinic in 1 year for follow-up

## 2022-07-08 NOTE — Progress Notes (Signed)
Patient Care Team: Janora Norlander, DO as PCP - General (Family Medicine) Stark Klein, MD as Consulting Physician (General Surgery) Nicholas Lose, MD as Consulting Physician (Hematology and Oncology) Gery Pray, MD as Consulting Physician (Radiation Oncology) Melissa Noon, [place] as Referring Physician (Optometry)  DIAGNOSIS:  Encounter Diagnosis  Name Primary?   Malignant neoplasm of upper-inner quadrant of right breast in female, estrogen receptor positive ([place])     SUMMARY OF ONCOLOGIC HISTORY: Oncology History  Malignant neoplasm of upper-inner quadrant of right breast in female, estrogen receptor positive ([place])  07/06/2020 Initial Diagnosis   Screening mammogram detected a 0.8cm right breast mass. Diagnostic mammogram showed the mass measuring 0.7cm, with no right axillary adenopathy. 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   07/11/2020 Cancer Staging   Staging form: Breast, AJCC 8th Edition - Clinical stage from 07/11/2020: Stage IA (cT1b, cN0, cM0, G2, ER+, PR+, HER2-)   08/22/2020 Surgery   Right lumpectomy Barry Dienes) ([id number]): IDC, 1.1cm, grade 2, clear margins. No regional lymph nodes were examined.   08/2020 - 08/2025 Anti-estrogen oral therapy   Anatrozole   11/04/2020 Cancer Staging   Staging form: Breast, AJCC 8th Edition - Pathologic: Stage IA (pT1c, pN0, cM0, G2, ER+, PR+, HER2-)     CHIEF COMPLIANT: Follow-up of right breast cancer on anastrozole   INTERVAL HISTORY: Christine Cantrell is a 76 y.o. with above-mentioned history of right breast cancer who underwent a right lumpectomy and is currently on antiestrogen therapy with anastrozole. She presents to the clinic today for follow-up.     ALLERGIES:  has No Known Allergies.  MEDICATIONS:  Current Outpatient Medications  Medication Sig Dispense Refill   anastrozole (ARIMIDEX) 1 MG tablet Take 1 tablet (1 mg total) by mouth daily. 90 tablet 3   Coenzyme  Q10-Vitamin E (QUNOL ULTRA COQ10 PO) Take 1 capsule by mouth daily after lunch.      Krill Oil 500 MG CAPS Take 500 mg by mouth daily after lunch.      levothyroxine (SYNTHROID) 75 MCG tablet Take 1 tablet (75 mcg total) by mouth daily before breakfast. 90 tablet 3   lisinopril (ZESTRIL) 10 MG tablet Take 1 tablet (10 mg total) by mouth daily. 90 tablet 3   lisinopril-hydrochlorothiazide (ZESTORETIC) 20-12.5 MG tablet Take 1 tablet by mouth daily. 90 tablet 3   Magnesium 250 MG TABS Take 250 mg by mouth daily after lunch.      Multiple Vitamin (MULTIVITAMIN WITH MINERALS) TABS tablet Take 1 tablet by mouth daily after lunch. Women's One A Day Multivitamin      simvastatin (ZOCOR) 20 MG tablet Take 1 tablet (20 mg total) by mouth daily. 90 tablet 3   No current facility-administered medications for this visit.    PHYSICAL EXAMINATION: ECOG PERFORMANCE STATUS: 1 - Symptomatic but completely ambulatory  Vitals:   07/08/22 1201  BP: (!) 160/78  Pulse: 82  Resp: 18  Temp: 97.7 F (36.5 C)  SpO2: 99%   Filed Weights   07/08/22 1201  Weight: 157 lb 1.6 oz (71.3 kg)    BREAST: No palpable masses or nodules in either right or left breasts. No palpable axillary supraclavicular or infraclavicular adenopathy no breast tenderness or nipple discharge. (exam performed in the presence of a chaperone)  LABORATORY DATA:  I have reviewed the data as listed    Latest Ref Rng & Units 07/31/2021    9:00 AM 08/16/2020    9:42 AM 07/11/2020  7:33 AM  CMP  Glucose 65 - 99 mg/dL 116  114  198   BUN 8 - 27 mg/dL 13  15  14    Creatinine 0.57 - 1.00 mg/dL 0.69  0.63  0.85   Sodium 134 - 144 mmol/L 135  129  132   Potassium 3.5 - 5.2 mmol/L 4.6  4.2  3.9   Chloride 96 - 106 mmol/L 95  97  95   CO2 20 - 29 mmol/L 23  23  25    Calcium 8.7 - 10.3 mg/dL 9.6  9.1  9.8   Total Protein 6.0 - 8.5 g/dL 6.8   7.7   Total Bilirubin 0.0 - 1.2 mg/dL 0.9   1.6   Alkaline Phos 44 - 121 IU/L 33   32   AST 0 - 40  IU/L 30   39   ALT 0 - 32 IU/L 26   40     Lab Results  Component Value Date   WBC 3.4 07/31/2021   HGB 14.8 07/31/2021   HCT 42.7 07/31/2021   MCV 95 07/31/2021   PLT 157 07/31/2021   NEUTROABS 3.9 07/11/2020    ASSESSMENT & PLAN:  Malignant neoplasm of upper-inner quadrant of right breast in female, estrogen receptor positive ([place]) 07/06/2020:Screening mammogram detected a 0.8cm right breast mass. Diagnostic mammogram showed the mass measuring 0.7cm, with no right axillary adenopathy. 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 T1BN0 stage Ia   08/22/2020:Right lumpectomy ([place]): IDC, 1.1cm, grade 2, clear margins.  ER 95%, PR 95%, Ki-67 30%, IHC equivocal, FISH negative Refused radiation therapy.   Treatment plan: adjuvant antiestrogen therapy with anastrozole 1 mg daily x5 years started 09/04/2020   Anastrozole Toxicities: Denies any hot flashes or arthralgias or myalgias.   Breast Cancer Surveillance: 1. Breast Exam 07/08/2022: Benign 2. Mammogram: 06/25/2022 and [facility]: Benign   Return to clinic in 1 year for follow-up    No orders of the defined types were placed in this encounter.  The patient has a good understanding of the overall plan. she agrees with it. she will call with any problems that may develop before the next visit here. Total time spent: 30 mins including face to face time and time spent for planning, charting and co-ordination of care   Harriette Ohara, MD 07/08/22    I Gardiner Coins am scribing for Dr. Lindi Adie  I have reviewed the above documentation for accuracy and completeness, and I agree with the above.

## 2022-07-09 ENCOUNTER — Telehealth: Payer: Self-pay | Admitting: Hematology and Oncology

## 2022-07-09 NOTE — Telephone Encounter (Signed)
Scheduled appointment per 7/11 los. Left voicemail.

## 2022-08-14 ENCOUNTER — Other Ambulatory Visit: Payer: Self-pay | Admitting: Family Medicine

## 2022-08-14 DIAGNOSIS — I10 Essential (primary) hypertension: Secondary | ICD-10-CM

## 2022-08-20 ENCOUNTER — Other Ambulatory Visit: Payer: Self-pay | Admitting: Family Medicine

## 2022-08-20 DIAGNOSIS — I10 Essential (primary) hypertension: Secondary | ICD-10-CM

## 2022-08-21 DIAGNOSIS — L578 Other skin changes due to chronic exposure to nonionizing radiation: Secondary | ICD-10-CM | POA: Diagnosis not present

## 2022-08-21 DIAGNOSIS — L570 Actinic keratosis: Secondary | ICD-10-CM | POA: Diagnosis not present

## 2022-08-21 DIAGNOSIS — D225 Melanocytic nevi of trunk: Secondary | ICD-10-CM | POA: Diagnosis not present

## 2022-08-21 DIAGNOSIS — L814 Other melanin hyperpigmentation: Secondary | ICD-10-CM | POA: Diagnosis not present

## 2022-08-21 DIAGNOSIS — L821 Other seborrheic keratosis: Secondary | ICD-10-CM | POA: Diagnosis not present

## 2022-09-04 ENCOUNTER — Other Ambulatory Visit: Payer: Self-pay | Admitting: Family Medicine

## 2022-09-04 DIAGNOSIS — E782 Mixed hyperlipidemia: Secondary | ICD-10-CM

## 2022-10-07 ENCOUNTER — Other Ambulatory Visit: Payer: Self-pay | Admitting: Family Medicine

## 2022-10-07 DIAGNOSIS — E782 Mixed hyperlipidemia: Secondary | ICD-10-CM

## 2022-10-08 ENCOUNTER — Encounter (INDEPENDENT_AMBULATORY_CARE_PROVIDER_SITE_OTHER): Payer: Medicare Other

## 2022-10-11 ENCOUNTER — Other Ambulatory Visit: Payer: Self-pay | Admitting: Family Medicine

## 2022-10-11 DIAGNOSIS — E782 Mixed hyperlipidemia: Secondary | ICD-10-CM

## 2022-10-13 ENCOUNTER — Telehealth: Payer: Self-pay | Admitting: Family Medicine

## 2022-10-13 ENCOUNTER — Encounter: Payer: Self-pay | Admitting: Family Medicine

## 2022-10-13 ENCOUNTER — Ambulatory Visit (INDEPENDENT_AMBULATORY_CARE_PROVIDER_SITE_OTHER): Payer: Medicare Other | Admitting: Family Medicine

## 2022-10-13 VITALS — BP 138/68 | HR 121 | Temp 98.8°F | Ht 66.0 in | Wt 153.0 lb

## 2022-10-13 DIAGNOSIS — E890 Postprocedural hypothyroidism: Secondary | ICD-10-CM

## 2022-10-13 DIAGNOSIS — G3184 Mild cognitive impairment, so stated: Secondary | ICD-10-CM | POA: Diagnosis not present

## 2022-10-13 DIAGNOSIS — Z23 Encounter for immunization: Secondary | ICD-10-CM | POA: Diagnosis not present

## 2022-10-13 LAB — MICROSCOPIC EXAMINATION
Epithelial Cells (non renal): NONE SEEN /hpf (ref 0–10)
RBC, Urine: NONE SEEN /hpf (ref 0–2)
Renal Epithel, UA: NONE SEEN /hpf

## 2022-10-13 LAB — URINALYSIS, ROUTINE W REFLEX MICROSCOPIC
Bilirubin, UA: NEGATIVE
Ketones, UA: NEGATIVE
Leukocytes,UA: NEGATIVE
Nitrite, UA: NEGATIVE
RBC, UA: NEGATIVE
Specific Gravity, UA: >1.03 — ABNORMAL HIGH (ref 1.005–1.030)
Urobilinogen, Ur: 0.2 mg/dL (ref 0.2–1.0)
pH, UA: 5.5 (ref 5.0–7.5)

## 2022-10-13 NOTE — Patient Instructions (Signed)
Mild Neurocognitive Disorder Mild neurocognitive disorder, formerly known as mild cognitive impairment, is a disorder in which memory does not work as well as it should. This disorder may also cause problems with other mental functions, including thought, communication, behavior, and completion of tasks. These problems can be noticed and measured, but they usually do not interfere with daily activities or the ability to live independently. Mild neurocognitive disorder typically develops after 78 years of age, but it can also develop at younger ages. It is not as serious as major neurocognitive disorder, also known as dementia, but it may be the first sign of it. Generally, symptoms of this condition get worse over time. In rare cases, symptoms can get better. What are the causes? This condition may be caused by: Brain disorders like Alzheimer's disease, Parkinson's disease, and other conditions that gradually damage nerve cells (neurodegenerative conditions). Diseases that affect blood vessels in the brain and result in small strokes. Certain infections, such as HIV. Traumatic brain injury. Other medical conditions, such as brain tumors, underactive thyroid (hypothyroidism), and vitamin B12 deficiency. Use of certain drugs or prescription medicines. What increases the risk? The following factors may make you more likely to develop this condition: Being older than 65 years. Being female. Low education level. Diabetes, high blood pressure, high cholesterol, and other conditions that increase the risk for blood vessel diseases. Untreated or undertreated sleep apnea. Having a certain type of gene that can be passed from parent to child (inherited). Chronic health problems such as heart disease, lung disease, liver disease, kidney disease, or depression. What are the signs or symptoms? Symptoms of this condition include: Difficulty remembering. You may: Forget names, phone numbers, or details of  recent events. Forget social events and appointments. Repeatedly forget where you put your car keys or other items. Difficulty thinking and solving problems. You may have trouble with complex tasks, such as: Paying bills. Driving in unfamiliar places. Difficulty communicating. You may have trouble: Finding the right word or naming an object. Forming a sentence that makes sense, or understanding what you read or hear. Changes in your behavior or personality. When this happens, you may: Lose interest in the things that you used to enjoy. Withdraw from social situations. Get angry more easily than usual. Act before thinking. How is this diagnosed? This condition is diagnosed based on: Your symptoms. Your health care provider may ask you and the people you spend time with, such as family and friends, about your symptoms. Evaluation of mental functions (neuropsychological testing). Your health care provider may refer you to a neurologist or mental health specialist to evaluate your mental functions in detail. To identify the cause of your condition, your health care provider may: Get a detailed medical history. Ask about use of alcohol, drugs, and prescription medicines. Do a physical exam. Order blood tests and brain imaging exams. How is this treated? Mild neurocognitive disorder that is caused by medicine use, drug use, infection, or another medical condition may improve when the cause is treated, or when medicines or drugs are stopped. If this disorder has another cause, it generally does not improve and may get worse. In these cases, the goal of treatment is to help you manage the loss of mental function. Treatments in these cases include: Medicine. Medicine mainly helps memory and behavior symptoms. Talk therapy. Talk therapy provides education, emotional support, memory aids, and other ways of making up for problems with mental function. Lifestyle changes, including: Getting regular  exercise. Eating a healthy diet   that includes omega-3 fatty acids. Challenging your thinking and memory skills. Having more social interaction. Follow these instructions at home: Eating and drinking  Drink enough fluid to keep your urine pale yellow. Eat a healthy diet that includes omega-3 fatty acids. These can be found in: Fish. Nuts. Leafy vegetables. Vegetable oils. If you drink alcohol: Limit how much you use to: 0-1 drink a day for women. 0-2 drinks a day for men. Be aware of how much alcohol is in your drink. In the [place] one drink equals one 12 oz bottle of beer (355 mL), one 5 oz glass of wine (148 mL), or one 1 oz glass of hard liquor (44 mL). Lifestyle  Get regular exercise as told by your health care provider. Do not use any products that contain nicotine or tobacco, such as cigarettes, e-cigarettes, and chewing tobacco. If you need help quitting, ask your health care provider. Practice ways to manage stress. If you need help managing stress, ask your health care provider. Continue to have social interaction. Keep your mind active with stimulating activities you enjoy, such as reading or playing games. Make sure to get quality sleep. Follow these tips: Avoid napping during the day. Keep your sleeping area dark and cool. Avoid exercising during the few hours before you go to bed. Avoid caffeine products in the evening. General instructions Take over-the-counter and prescription medicines only as told by your health care provider. Your health care provider may recommend that you avoid taking medicines that can affect thinking, such as pain medicines or sleep medicines. Work with your health care provider to find out what you need help with and what your safety needs are. Keep all follow-up visits. This is important. Where to find more information National Institute on Aging: www.nia.nih.gov Contact a health care provider if: You have any new symptoms. Get help right  away if: You develop new confusion or your confusion gets worse. You act in ways that place you or your family in danger. Summary Mild neurocognitive disorder is a disorder in which memory does not work as well as it should. Mild neurocognitive disorder can have many causes. It may be the first stage of dementia. To manage your condition, get regular exercise, keep your mind active, get quality sleep, and eat a healthy diet. This information is not intended to replace advice given to you by your health care provider. Make sure you discuss any questions you have with your health care provider. Document Revised: 04/30/2020 Document Reviewed: 04/30/2020 Elsevier Patient Education  2023 Elsevier Inc.  

## 2022-10-13 NOTE — Progress Notes (Signed)
Subjective: WT:UUEKCM changes PCP: Janora Norlander, DO KLK:[id number] Christine Cantrell is a 76 y.o. female presenting to clinic today for:  1. Memory changes  Patient is accompanied today's visit by her neighbor but one of her best friends actually messaged on my chart to let as noted that the patient has been having some concerning features of memory loss, specifically short-term memory loss.  She apparently got lost driving [place] a few weeks ago and had to stop and ask for directions.  She is also been a bit more confrontational which is not her personality.  The patient admits that she has been depressed over the last couple of months but cannot identify a reason why she would be depressed.  She has had no significant changes in her breast cancer history nor treatment.  No history of chemoradiation for breast cancer.  There is a family history of Alzheimer's disease in her mother, who passed away in her 59s.  Patient does not feel that she is having any significant memory changes but her friends have certainly appreciated changes in the patient.  She denies any urinary symptoms.  No neurologic changes including unilateral weakness, sensory changes or falls.  No visual disturbance reported.  No changes in hearing.  She admits that she has not been as social as she wants to be.   ROS: Per HPI  No Known Allergies Past Medical History:  Diagnosis Date   Abnormal EKG saw dr hochrein 3 yrs ago for   hx of left bundle branch block on ekg since 2002   Breast cancer ([place])    Depression    Diverticulosis large intestine w/o perforation or abscess w/bleeding 2016   Family history of lung cancer    Family history of pancreatic cancer    Family history of prostate cancer    GERD (gastroesophageal reflux disease)    hx of   High cholesterol    History of hiatal hernia    Hx of colonic polyps 2016   adenomatous   Hypertension    Hypothyroidism    Pancreatic cyst    Thyroid disease    hypothyroidism     Current Outpatient Medications:    anastrozole (ARIMIDEX) 1 MG tablet, Take 1 tablet (1 mg total) by mouth daily., Disp: 90 tablet, Rfl: 3   Coenzyme Q10-Vitamin E (QUNOL ULTRA COQ10 PO), Take 1 capsule by mouth daily after lunch. , Disp: , Rfl:    Krill Oil 500 MG CAPS, Take 500 mg by mouth daily after lunch. , Disp: , Rfl:    levothyroxine (SYNTHROID) 75 MCG tablet, Take 1 tablet (75 mcg total) by mouth daily before breakfast., Disp: 90 tablet, Rfl: 3   lisinopril (ZESTRIL) 10 MG tablet, Take 1 tablet (10 mg total) by mouth daily., Disp: 90 tablet, Rfl: 3   lisinopril-hydrochlorothiazide (ZESTORETIC) 20-12.5 MG tablet, Take 1 tablet by mouth daily. (NEEDS TO BE SEEN BEFORE NEXT REFILL), Disp: 30 tablet, Rfl: 0   Magnesium 250 MG TABS, Take 250 mg by mouth daily after lunch. , Disp: , Rfl:    Multiple Vitamin (MULTIVITAMIN WITH MINERALS) TABS tablet, Take 1 tablet by mouth daily after lunch. Women's One A Day Multivitamin , Disp: , Rfl:    simvastatin (ZOCOR) 20 MG tablet, Take 1 tablet (20 mg total) by mouth daily., Disp: 30 tablet, Rfl: 0 Social History   Socioeconomic History   Marital status: Single    Spouse name: Not on file   Number of children: 0   Years  of education: 16   Highest education level: Bachelor's degree (e.g., BA, AB, BS)  Occupational History   Occupation: retired    Comment: Therapist, nutritional  Tobacco Use   Smoking status: Never   Smokeless tobacco: Never  Vaping Use   Vaping Use: Never used  Substance and Sexual Activity   Alcohol use: Yes    Alcohol/week: 1.0 standard drink of alcohol    Types: 1 Glasses of wine per week    Comment: occasionally   Drug use: Never   Sexual activity: Not Currently    Partners: Male    Birth control/protection: Surgical  Other Topics Concern   Not on file  Social History Narrative   Lives alone.  No children   Her brother and sister live nearby   Social Determinants of Health   Financial Resource Strain: Low  Risk  (10/07/2021)   Overall Financial Resource Strain (CARDIA)    Difficulty of Paying Living Expenses: Not hard at all  Food Insecurity: No Food Insecurity (10/07/2021)   Hunger Vital Sign    Worried About Running Out of Food in the Last Year: Never true    Ran Out of Food in the Last Year: Never true  Transportation Needs: No Transportation Needs (10/07/2021)   PRAPARE - Hydrologist (Medical): No    Lack of Transportation (Non-Medical): No  Physical Activity: Sufficiently Active (10/07/2021)   Exercise Vital Sign    Days of Exercise per Week: 6 days    Minutes of Exercise per Session: 60 min  Stress: No Stress Concern Present (10/07/2021)   [place]    Feeling of Stress : Not at all  Social Connections: Moderately Integrated (10/07/2021)   Social Connection and Isolation Panel [NHANES]    Frequency of Communication with Friends and Family: More than three times a week    Frequency of Social Gatherings with Friends and Family: More than three times a week    Attends Religious Services: More than 4 times per year    Active Member of [employer] or Organizations: Yes    Attends Music therapist: More than 4 times per year    Marital Status: Never married  Intimate Partner Violence: Not At Risk (10/07/2021)   Humiliation, Afraid, Rape, and Kick questionnaire    Fear of Current or Ex-Partner: No    Emotionally Abused: No    Physically Abused: No    Sexually Abused: No   Family History  Problem Relation Age of Onset   High Cholesterol Mother    Hypertension Mother    Goiter Mother    High Cholesterol Father    Hypertension Father    Hypertension Brother    Lung disease Brother        chronic smoker   Heart disease Brother    Goiter Sister    Heart disease Brother    Hypertension Brother    Prostate cancer Brother 65   Lung cancer Maternal Uncle        smoker    Pancreatic cancer Nephew        dx. 40s/50s   Cancer Nephew        pancreatic vs. multiple myeloma; dx. 40s/50s   Colon cancer Neg Hx    Stomach cancer Neg Hx     Objective: Office vital signs reviewed. BP 138/68   Pulse (!) 121   Temp 98.8 F (37.1 C)   Ht 5' 6" (1.676  m)   Wt 153 lb (69.4 kg)   SpO2 98%   BMI 24.69 kg/m   Physical Examination:  General: Awake, alert, well nourished, No acute distress HEENT: Sclera white.  Moist mucous membranes Cardio: Initially tachycardic but heart rate was closer to 98 on exam Pulm: Normal work of breathing on room air Neuro: See MMSE.  Cranial nerves II through XII grossly intact but she does have some difficulty following commands, specifically with cerebellar testing in the upper extremity     10/13/2022   12:14 PM 07/15/2018    8:16 AM  MMSE - Mini Mental State Exam  Orientation to time 4 5  Orientation to Place 5 5  Registration 3 3  Attention/ Calculation 3 5  Recall 0 3  Language- name 2 objects 2 2  Language- repeat 0 1  Language- follow 3 step command 3 3  Language- read & follow direction 1 1  Write a sentence 1 1  Copy design 1 1  Total score 23 30     Assessment/ Plan: 76 y.o. female   MCI (mild cognitive impairment) with memory loss - Plan: TSH, T4, Free, CBC, CMP14+EGFR, RPR, HIV antibody (with reflex), Vitamin B12, Urinalysis, Routine w reflex microscopic, MR Brain Wo Contrast  Postoperative hypothyroidism - Plan: TSH, T4, Free  Need for immunization against influenza - Plan: Flu Vaccine QUAD High Dose(Fluad)  Certainly has a drop in her MMSE score which is very suggestive of MCI.  Check for metabolic etiology.  MRI brain has been ordered to further evaluate as well.  We discussed that if in fact there were no metabolic reasons for her to have these changes that we should strongly consider initiation of Namenda or similar.  We discussed the progressive nature of dementia.  May need to consider formal  evaluation by neurology as well  Influenza vaccination administered  No orders of the defined types were placed in this encounter.  No orders of the defined types were placed in this encounter.    Janora Norlander, DO [place] [phone]

## 2022-10-13 NOTE — Telephone Encounter (Signed)
From my completed note: "Certainly has a drop in her MMSE score which is very suggestive of MCI.  Check for metabolic etiology.  MRI brain has been ordered to further evaluate as well.  We discussed that if in fact there were no metabolic reasons for her to have these changes that we should strongly consider initiation of Namenda or similar.  We discussed the progressive nature of dementia.  May need to consider formal evaluation by neurology as well"

## 2022-10-13 NOTE — Addendum Note (Signed)
Addended by: Everlean Cherry on: 10/13/2022 03:07 PM   Modules accepted: Orders

## 2022-10-13 NOTE — Telephone Encounter (Signed)
Patient calling confused about her medicine and questioning if a new med was called in today after her appt. Please call back

## 2022-10-13 NOTE — Telephone Encounter (Signed)
Pt has an appt today and her friend called to let Dr Lajuana Ripple know that pt has been having a lot of memory issues and confusion and friend is very worried about her. Says she wanted to let Dr Lajuana Ripple know this before pt comes in so that she knows what is going on. Says pt got lost driving in [place] a few weeks ago and had to stop and ask for directions. Says pt has traveled in [place] many many times. Also says that pt has been confrontational which isnt like her. Says she complains of loss of appetite and difficulty sleeping and also says she has went out to buy several phones because she loses them and cant remember where she put them.

## 2022-10-13 NOTE — Telephone Encounter (Signed)
Lmtcb.

## 2022-10-15 LAB — URINE CULTURE

## 2022-10-22 LAB — CMP14+EGFR
ALT: 29 IU/L (ref 0–32)
AST: 35 IU/L (ref 0–40)
Albumin/Globulin Ratio: 1.6 (ref 1.2–2.2)
Albumin: 5 g/dL — ABNORMAL HIGH (ref 3.8–4.8)
Alkaline Phosphatase: 44 IU/L (ref 44–121)
BUN/Creatinine Ratio: 12 (ref 12–28)
BUN: 11 mg/dL (ref 8–27)
Bilirubin Total: 0.7 mg/dL (ref 0.0–1.2)
CO2: 18 mmol/L — ABNORMAL LOW (ref 20–29)
Calcium: 9.9 mg/dL (ref 8.7–10.3)
Chloride: 97 mmol/L (ref 96–106)
Creatinine, Ser: 0.92 mg/dL (ref 0.57–1.00)
Globulin, Total: 3.1 g/dL (ref 1.5–4.5)
Glucose: 296 mg/dL — ABNORMAL HIGH (ref 70–99)
Potassium: 4.3 mmol/L (ref 3.5–5.2)
Sodium: 144 mmol/L (ref 134–144)
Total Protein: 8.1 g/dL (ref 6.0–8.5)
eGFR: 64 mL/min/{1.73_m2} (ref >59)

## 2022-10-22 LAB — CBC
Hematocrit: 49.6 % — ABNORMAL HIGH (ref 34.0–46.6)
Hemoglobin: 17.5 g/dL — ABNORMAL HIGH (ref 11.1–15.9)
MCH: 34.1 pg — ABNORMAL HIGH (ref 26.6–33.0)
MCHC: 35.3 g/dL (ref 31.5–35.7)
MCV: 97 fL (ref 79–97)
Platelets: 174 10*3/uL (ref 150–450)
RBC: 5.13 x10E6/uL (ref 3.77–5.28)
RDW: 11.9 % (ref 11.7–15.4)
WBC: 6.4 10*3/uL (ref 3.4–10.8)

## 2022-10-22 LAB — T4, FREE: Free T4: 1.27 ng/dL (ref 0.82–1.77)

## 2022-10-22 LAB — VITAMIN B12: Vitamin B-12: 276 pg/mL (ref 232–1245)

## 2022-10-22 LAB — HIV ANTIBODY (ROUTINE TESTING W REFLEX): HIV Screen 4th Generation wRfx: NONREACTIVE

## 2022-10-22 LAB — RPR

## 2022-10-22 LAB — TSH: TSH: 1.64 u[IU]/mL (ref 0.450–4.500)

## 2022-10-27 ENCOUNTER — Other Ambulatory Visit: Payer: Self-pay | Admitting: Family Medicine

## 2022-10-27 DIAGNOSIS — E782 Mixed hyperlipidemia: Secondary | ICD-10-CM

## 2022-10-30 ENCOUNTER — Telehealth: Payer: Self-pay

## 2022-10-30 NOTE — Telephone Encounter (Signed)
Baldo Ash called and lets [place] know that she is severely worried about [place]- memory loss, confusion, problem solve, fatigue, gait, argumentative, roaming at night, not taking medication, she is just all around not herself. Baldo Ash states she come to visit her in [place] and there is something severely wrong

## 2022-11-03 ENCOUNTER — Telehealth: Payer: Self-pay | Admitting: Family Medicine

## 2022-11-03 NOTE — Telephone Encounter (Signed)
REFERRAL REQUEST Telephone Note  Have you been seen at our office for this problem? Not sure (Advise that they may need an appointment with their PCP before a referral can be done)  Reason for Referral: not sure Referral discussed with patient: not sure  Best contact number of patient for referral team:   [phone]      Has patient been seen by a specialist for this issue before: not sure  Patient provider preference for referral: none Patient location preference for referral: none   Patient notified that referrals can take up to a week or longer to process. If they haven't heard anything within a week they should call back and speak with the referral department.    Please call pt about her referral coming up om Wednesday.

## 2022-11-05 ENCOUNTER — Ambulatory Visit (HOSPITAL_COMMUNITY)
Admission: RE | Admit: 2022-11-05 | Discharge: 2022-11-05 | Disposition: A | Discharge status: Home / Self Care | Payer: Medicare Other | Source: Ambulatory Visit | Attending: Family Medicine | Admitting: Family Medicine

## 2022-11-05 ENCOUNTER — Telehealth: Payer: Self-pay | Admitting: Family Medicine

## 2022-11-05 ENCOUNTER — Encounter (HOSPITAL_COMMUNITY): Payer: Self-pay

## 2022-11-05 DIAGNOSIS — G3184 Mild cognitive impairment, so stated: Secondary | ICD-10-CM

## 2022-11-05 NOTE — Telephone Encounter (Signed)
Finally able to find DPR with Ms. Mallory's name on it for this patient.  I have updated her on the evaluation thus far and the fact that Ms. Soyars has canceled her MRI.  She certainly demonstrates levels of confusion that are quite concerning.  I think the MRI of the brain is quite important given the history of cancer.  Of course the possibilty of metastases has been considered.  She does not demonstrate neurologic changes besides the memory but this is something that should certainly be ruled out.  I am going to reach out to her brother today in efforts to coordinate her care.  Hopefully we can reschedule this MRI ASAP.

## 2022-11-10 ENCOUNTER — Telehealth: Payer: Self-pay

## 2022-11-10 NOTE — Telephone Encounter (Signed)
Called and spoke with patient's friend Garlon Hatchet. She states that patient has been very confused lately. She is scheduled for an MRI of the brain due to symptoms. I provided Baldo Ash with the phone # to radiology scheduling so that she can schedule patient's appt at their convenience. She is aware that this is a surveillance study and can be completed after MRI of brain. Baldo Ash verbalized understanding of all information and had no concerns at the end of the call.

## 2022-11-10 NOTE — Telephone Encounter (Signed)
-----   Message from Yevette Edwards, RN sent at 11/15/2021 10:54 AM EST ----- Regarding: MRCP MR ABD MRCP W WO contrast - cystic pancreatic lesion surveillance, the order is in epic

## 2022-11-11 ENCOUNTER — Telehealth: Payer: Self-pay | Admitting: Family Medicine

## 2022-11-11 NOTE — Telephone Encounter (Signed)
Discussed Christine Cantrell' care and recent cognitive assessment and concern for dementia with her brother Christine Cantrell.  I discussed need for MRI.  He is aware of rescheduled appt this Thursday at 2pm.  MMSE is suggestive of MCI and I will recommend evaluation with neurology next pending MRI brain results.  He notes she stayed with him for a few nights after she came back from her friend's home and he observed any "way out" behavior but he has seen her seem to be a little confused but nothing dangerous.    He worries that recent events were maybe blown out of proportion.  Discussed MRI has been rescheduled for this Thursday and he is aware of time.  Rital Cavey M. Lajuana Ripple, [facility]

## 2022-11-13 ENCOUNTER — Ambulatory Visit (HOSPITAL_BASED_OUTPATIENT_CLINIC_OR_DEPARTMENT_OTHER)
Admission: RE | Admit: 2022-11-13 | Discharge: 2022-11-13 | Disposition: A | Discharge status: Home / Self Care | Payer: Medicare Other | Source: Ambulatory Visit | Attending: Family Medicine | Admitting: Family Medicine

## 2022-11-13 DIAGNOSIS — G319 Degenerative disease of nervous system, unspecified: Secondary | ICD-10-CM | POA: Diagnosis not present

## 2022-11-13 DIAGNOSIS — R413 Other amnesia: Secondary | ICD-10-CM | POA: Diagnosis not present

## 2022-11-13 DIAGNOSIS — G3184 Mild cognitive impairment, so stated: Secondary | ICD-10-CM | POA: Insufficient documentation

## 2022-11-18 ENCOUNTER — Telehealth: Payer: Self-pay

## 2022-11-18 NOTE — Telephone Encounter (Signed)
[place] aware of labs

## 2022-11-19 NOTE — Telephone Encounter (Signed)
Please see MRI results from 11/16 with additional notes.

## 2022-11-26 ENCOUNTER — Ambulatory Visit (HOSPITAL_COMMUNITY)
Admission: RE | Admit: 2022-11-26 | Discharge: 2022-11-26 | Disposition: A | Discharge status: Home / Self Care | Payer: Medicare Other | Source: Ambulatory Visit | Attending: Gastroenterology | Admitting: Gastroenterology

## 2022-11-26 ENCOUNTER — Other Ambulatory Visit: Payer: Self-pay | Admitting: Gastroenterology

## 2022-11-26 DIAGNOSIS — K862 Cyst of pancreas: Secondary | ICD-10-CM

## 2022-11-26 DIAGNOSIS — I700 Atherosclerosis of aorta: Secondary | ICD-10-CM | POA: Diagnosis not present

## 2022-11-26 DIAGNOSIS — R935 Abnormal findings on diagnostic imaging of other abdominal regions, including retroperitoneum: Secondary | ICD-10-CM | POA: Diagnosis not present

## 2022-11-26 MED ORDER — GADOBUTROL 1 MMOL/ML IV SOLN
7.0000 mL | Freq: Once | INTRAVENOUS | Status: AC | PRN
  Administered 2022-11-26: 7 mL via INTRAVENOUS

## 2022-12-15 DIAGNOSIS — L570 Actinic keratosis: Secondary | ICD-10-CM | POA: Diagnosis not present

## 2022-12-15 DIAGNOSIS — L578 Other skin changes due to chronic exposure to nonionizing radiation: Secondary | ICD-10-CM | POA: Diagnosis not present

## 2022-12-15 DIAGNOSIS — L814 Other melanin hyperpigmentation: Secondary | ICD-10-CM | POA: Diagnosis not present

## 2022-12-15 DIAGNOSIS — L821 Other seborrheic keratosis: Secondary | ICD-10-CM | POA: Diagnosis not present

## 2022-12-15 DIAGNOSIS — Z09 Encounter for follow-up examination after completed treatment for conditions other than malignant neoplasm: Secondary | ICD-10-CM | POA: Diagnosis not present

## 2023-01-14 ENCOUNTER — Encounter: Payer: Self-pay | Admitting: Family Medicine

## 2023-01-14 ENCOUNTER — Ambulatory Visit (INDEPENDENT_AMBULATORY_CARE_PROVIDER_SITE_OTHER): Payer: Medicare Other | Admitting: Family Medicine

## 2023-01-14 VITALS — BP 161/82 | HR 98 | Temp 97.2°F | Ht 66.0 in | Wt 159.4 lb

## 2023-01-14 DIAGNOSIS — I10 Essential (primary) hypertension: Secondary | ICD-10-CM

## 2023-01-14 DIAGNOSIS — G3184 Mild cognitive impairment, so stated: Secondary | ICD-10-CM | POA: Diagnosis not present

## 2023-01-14 DIAGNOSIS — G8929 Other chronic pain: Secondary | ICD-10-CM

## 2023-01-14 DIAGNOSIS — G319 Degenerative disease of nervous system, unspecified: Secondary | ICD-10-CM | POA: Diagnosis not present

## 2023-01-14 DIAGNOSIS — M25561 Pain in right knee: Secondary | ICD-10-CM

## 2023-01-14 DIAGNOSIS — E782 Mixed hyperlipidemia: Secondary | ICD-10-CM

## 2023-01-14 MED ORDER — LISINOPRIL-HYDROCHLOROTHIAZIDE 20-12.5 MG PO TABS: 1.0000 | ORAL_TABLET | Freq: Every morning | ORAL | 3 refills | Status: DC

## 2023-01-14 MED ORDER — LISINOPRIL 10 MG PO TABS: 10.0000 mg | ORAL_TABLET | Freq: Every evening | ORAL | 3 refills | Status: DC

## 2023-01-14 MED ORDER — SIMVASTATIN 20 MG PO TABS: 20.0000 mg | ORAL_TABLET | Freq: Every day | ORAL | 3 refills | Status: DC

## 2023-01-14 MED ORDER — LEVOTHYROXINE SODIUM 75 MCG PO TABS: 75.0000 ug | ORAL_TABLET | Freq: Every day | ORAL | 3 refills | Status: DC

## 2023-01-14 NOTE — Progress Notes (Signed)
Subjective: CC:f/u MCi PCP: Janora Norlander, DO EVO:JJKKX Morro is a 77 y.o. female presenting to clinic today for:  1. MCI Noted on 10/13/2022 screening. MRI brain with atrophy but no other findings to explain.  She is accompanied to visit by her brother.  She has been doing well since last visit. Continues to live independently. No falls. Brother checks in with her.  She does admit to some loneliness, as most of her friends live in another state.  2. HTN Compliant with meds. No CP, SOB, dizziness  3. Right knee pain Reports some swelling and discomfort along medial right knee. She apparently tried to catch herself during a fall 10/13/2022 and that's when pain started.  She denies locking/ popping/ worse pain with pivoting or bending.  Has a knee brace and uses intermittently.   ROS: Per HPI  No Known Allergies Past Medical History:  Diagnosis Date   Abnormal EKG saw dr hochrein 3 yrs ago for   hx of left bundle branch block on ekg since 2002   Breast cancer ([place])    Depression    Diverticulosis large intestine w/o perforation or abscess w/bleeding 2016   Family history of lung cancer    Family history of pancreatic cancer    Family history of prostate cancer    GERD (gastroesophageal reflux disease)    hx of   High cholesterol    History of hiatal hernia    Hx of colonic polyps 2016   adenomatous   Hypertension    Hypothyroidism    Pancreatic cyst    Thyroid disease    hypothyroidism    Current Outpatient Medications:    anastrozole (ARIMIDEX) 1 MG tablet, Take 1 tablet (1 mg total) by mouth daily., Disp: 90 tablet, Rfl: 3   Coenzyme Q10-Vitamin E (QUNOL ULTRA COQ10 PO), Take 1 capsule by mouth daily after lunch. , Disp: , Rfl:    Krill Oil 500 MG CAPS, Take 500 mg by mouth daily after lunch. , Disp: , Rfl:    levothyroxine (SYNTHROID) 75 MCG tablet, Take 1 tablet (75 mcg total) by mouth daily before breakfast., Disp: 90 tablet, Rfl: 3   lisinopril (ZESTRIL)  10 MG tablet, Take 1 tablet (10 mg total) by mouth daily., Disp: 90 tablet, Rfl: 3   lisinopril-hydrochlorothiazide (ZESTORETIC) 20-12.5 MG tablet, Take 1 tablet by mouth daily. (NEEDS TO BE SEEN BEFORE NEXT REFILL), Disp: 30 tablet, Rfl: 0   Magnesium 250 MG TABS, Take 250 mg by mouth daily after lunch. , Disp: , Rfl:    Multiple Vitamin (MULTIVITAMIN WITH MINERALS) TABS tablet, Take 1 tablet by mouth daily after lunch. Women's One A Day Multivitamin , Disp: , Rfl:    simvastatin (ZOCOR) 20 MG tablet, TAKE 1 TABLET BY MOUTH EVERY DAY, Disp: 90 tablet, Rfl: 0 Social History   Socioeconomic History   Marital status: Single    Spouse name: Not on file   Number of children: 0   Years of education: 16   Highest education level: Bachelor's degree (e.g., BA, AB, BS)  Occupational History   Occupation: retired    Comment: Therapist, nutritional  Tobacco Use   Smoking status: Never   Smokeless tobacco: Never  Vaping Use   Vaping Use: Never used  Substance and Sexual Activity   Alcohol use: Yes    Alcohol/week: 1.0 standard drink of alcohol    Types: 1 Glasses of wine per week    Comment: occasionally   Drug use: Never  Sexual activity: Not Currently    Partners: Male    Birth control/protection: Surgical  Other Topics Concern   Not on file  Social History Narrative   Lives alone.  No children   Her brother and sister live nearby   Social Determinants of Health   Financial Resource Strain: Low Risk  (10/07/2021)   Overall Financial Resource Strain (CARDIA)    Difficulty of Paying Living Expenses: Not hard at all  Food Insecurity: No Food Insecurity (10/07/2021)   Hunger Vital Sign    Worried About Running Out of Food in the Last Year: Never true    Ran Out of Food in the Last Year: Never true  Transportation Needs: No Transportation Needs (10/07/2021)   PRAPARE - Hydrologist (Medical): No    Lack of Transportation (Non-Medical): No  Physical  Activity: Sufficiently Active (10/07/2021)   Exercise Vital Sign    Days of Exercise per Week: 6 days    Minutes of Exercise per Session: 60 min  Stress: No Stress Concern Present (10/07/2021)   [place]    Feeling of Stress : Not at all  Social Connections: Moderately Integrated (10/07/2021)   Social Connection and Isolation Panel [NHANES]    Frequency of Communication with Friends and Family: More than three times a week    Frequency of Social Gatherings with Friends and Family: More than three times a week    Attends Religious Services: More than 4 times per year    Active Member of [employer] or Organizations: Yes    Attends Music therapist: More than 4 times per year    Marital Status: Never married  Intimate Partner Violence: Not At Risk (10/07/2021)   Humiliation, Afraid, Rape, and Kick questionnaire    Fear of Current or Ex-Partner: No    Emotionally Abused: No    Physically Abused: No    Sexually Abused: No   Family History  Problem Relation Age of Onset   High Cholesterol Mother    Hypertension Mother    Goiter Mother    High Cholesterol Father    Hypertension Father    Hypertension Brother    Lung disease Brother        chronic smoker   Heart disease Brother    Goiter Sister    Heart disease Brother    Hypertension Brother    Prostate cancer Brother 75   Lung cancer Maternal Uncle        smoker   Pancreatic cancer Nephew        dx. 40s/50s   Cancer Nephew        pancreatic vs. multiple myeloma; dx. 40s/50s   Colon cancer Neg Hx    Stomach cancer Neg Hx     Objective: Office vital signs reviewed. BP (!) 161/82   Pulse 98   Temp (!) 97.2 F (36.2 C)   Ht '5\' 6"'$  (1.676 m)   Wt 159 lb 6.4 oz (72.3 kg)   SpO2 98%   BMI 25.73 kg/m   Physical Examination:  General: Awake, alert, well nourished, No acute distress HEENT: sclera white, MMM Cardio: regular rate and rhythm, S1S2  heard, no murmurs appreciated Pulm: normal WOB on room air, CTAB MSK: ambulating independently.  Right knee with mild effusion medially.  No TTP to patella, patellar tendon, joint line or posterior popliteal fossa. Neuro: interacts with provider appropriately.  Assessment/ Plan: 77 y.o. female  MCI (mild cognitive impairment) with memory loss  Cerebral atrophy (HCC)  Essential hypertension - Plan: lisinopril (ZESTRIL) 10 MG tablet, lisinopril-hydrochlorothiazide (ZESTORETIC) 20-12.5 MG tablet  Mixed hyperlipidemia - Plan: simvastatin (ZOCOR) 20 MG tablet  Chronic pain of right knee - Plan: Ambulatory referral to Orthopedic Surgery  Discussed namenda vs aricept. We had a frank discussion about the progressive nature of dementia and I encouraged her to complete advanced directive.  Packet provided to patient today.  She is considering meds but wants to investigate further.  At this time, I suspect much of the abnormal observed behavior to be related to an acute delirium episode given travel/ time change.  She is not a danger to herself or others and I do not see a reason she cannot live independently at this time.  BP not at goal. Meds renewed. Encouraged to follow up in 2 weeks with nurse for recheck. See me in 2-3 months for full physical with fasting labs and to discuss med/ advance directive  Referral to Dr Lorin Mercy as requested for that right knee. Suspect meniscal injury.  Voltaren gel, ice, brace for now.  No orders of the defined types were placed in this encounter.  No orders of the defined types were placed in this encounter.    Janora Norlander, DO [place] [phone]

## 2023-01-14 NOTE — Patient Instructions (Addendum)
Namenda or Aricept to help reduce progression of memory loss. Voltaren gel to right knee up to 4 times daily as needed for swelling/ pain Ice, brace Referral to Dr Lorin Mercy place. Blood pressure too high.  Goal is less 150/90. Come in for BP check with nurse in 2 weeks.

## 2023-01-20 ENCOUNTER — Telehealth: Payer: Self-pay | Admitting: Family Medicine

## 2023-01-20 NOTE — Telephone Encounter (Signed)
Patient calling to clarify medications. Please call back.

## 2023-01-20 NOTE — Telephone Encounter (Signed)
Left vm for cb

## 2023-01-28 ENCOUNTER — Ambulatory Visit (INDEPENDENT_AMBULATORY_CARE_PROVIDER_SITE_OTHER): Payer: Medicare Other | Admitting: *Deleted

## 2023-01-28 VITALS — BP 169/79

## 2023-01-28 DIAGNOSIS — I10 Essential (primary) hypertension: Secondary | ICD-10-CM

## 2023-01-28 NOTE — Progress Notes (Signed)
Patient is aware that evening dose of lisinopril is to be increased from 10 mg to 20 mg

## 2023-01-29 ENCOUNTER — Telehealth: Payer: Self-pay | Admitting: Family Medicine

## 2023-02-03 NOTE — Telephone Encounter (Signed)
Triage contacted patient regarding B/P readings and change in medications.

## 2023-03-13 ENCOUNTER — Ambulatory Visit (INDEPENDENT_AMBULATORY_CARE_PROVIDER_SITE_OTHER): Payer: Medicare Other | Admitting: Family Medicine

## 2023-03-13 ENCOUNTER — Encounter: Payer: Self-pay | Admitting: Family Medicine

## 2023-03-13 ENCOUNTER — Encounter: Payer: Medicare Other | Admitting: Family Medicine

## 2023-03-13 VITALS — BP 148/89 | HR 80 | Temp 98.7°F | Ht 66.0 in | Wt 156.0 lb

## 2023-03-13 DIAGNOSIS — R739 Hyperglycemia, unspecified: Secondary | ICD-10-CM

## 2023-03-13 DIAGNOSIS — Z0001 Encounter for general adult medical examination with abnormal findings: Secondary | ICD-10-CM | POA: Diagnosis not present

## 2023-03-13 DIAGNOSIS — Z0000 Encounter for general adult medical examination without abnormal findings: Secondary | ICD-10-CM

## 2023-03-13 DIAGNOSIS — E890 Postprocedural hypothyroidism: Secondary | ICD-10-CM

## 2023-03-13 DIAGNOSIS — G3184 Mild cognitive impairment, so stated: Secondary | ICD-10-CM | POA: Diagnosis not present

## 2023-03-13 DIAGNOSIS — E782 Mixed hyperlipidemia: Secondary | ICD-10-CM

## 2023-03-13 DIAGNOSIS — G319 Degenerative disease of nervous system, unspecified: Secondary | ICD-10-CM

## 2023-03-13 DIAGNOSIS — I10 Essential (primary) hypertension: Secondary | ICD-10-CM | POA: Diagnosis not present

## 2023-03-13 LAB — BAYER DCA HB A1C WAIVED: HB A1C (BAYER DCA - WAIVED): 5.8 % — ABNORMAL HIGH (ref 4.8–5.6)

## 2023-03-13 MED ORDER — MEMANTINE HCL 5 MG PO TABS: 5.0000 mg | ORAL_TABLET | Freq: Two times a day (BID) | ORAL | 2 refills | Status: DC

## 2023-03-13 NOTE — Progress Notes (Signed)
Christine Cantrell is a 77 y.o. female presents to office today for annual physical exam examination.    Concerns today include: 1.  Follow-up memory Patient reports memory has been stable.  She really has not identified any additional memory loss but her friends and family sometimes do notice things.  She reports being totally able to perform all ADLs and IADLs.  Her brother lives nearby and they need up occasionally.  She was walking 3 to 4 miles per day but her friend, whom she walked with, had to take a break from this.  They are going to start doing this again soon however.  She is willing to start medications for her memory.  She was noted to be MCI at last visit and does not care which when I select today.  She does not want the memory loss to progressive course.  2.  Hypertension with hyperlipidemia Patient is taking her hypertension medication in the morning but has forgotten to take the evening dose.  She will start doing this.  Denies any dizziness, chest pain, shortness of breath or edema.  Compliant with Zocor  3.  Hypothyroidism Compliant with Synthroid 75 mcg daily.  No reports of tremor, heart palpitations, changes in bowel habits.  Occupation: retired, Substance use: none Diet: balanced, Exercise: walks Last eye exam: UTD Last dental exam: UTD Last colonoscopy: UTD Last mammogram: UTD Last pap smear: n/a Refills needed today: none Immunizations needed: Immunization History  Administered Date(s) Administered   Fluad Quad(high Dose 65+) 08/27/2019, 08/27/2019, 09/08/2021, 10/13/2022   Influenza, High Dose Seasonal PF 09/07/2018   Influenza-Unspecified 09/26/2015, 09/17/2016, 09/25/2017, 09/01/2020   PFIZER(Purple Top)SARS-COV-2 Vaccination 01/20/2020, 02/10/2020, 08/28/2020, 03/30/2021   PNEUMOCOCCAL CONJUGATE-20 01/20/2022   Pfizer Covid-19 Vaccine Bivalent Booster 48yrs & up 09/11/2021   Pneumococcal Polysaccharide-23 09/25/2017   Zoster Recombinat (Shingrix) 06/10/2017,  09/03/2017     Past Medical History:  Diagnosis Date   Abnormal EKG saw dr hochrein 3 yrs ago for   hx of left bundle branch block on ekg since 2002   Breast cancer (HCC)    Depression    Diverticulosis large intestine w/o perforation or abscess w/bleeding 2016   Family history of lung cancer    Family history of pancreatic cancer    Family history of prostate cancer    GERD (gastroesophageal reflux disease)    hx of   High cholesterol    History of hiatal hernia    Hx of colonic polyps 2016   adenomatous   Hypertension    Hypothyroidism    Pancreatic cyst    Thyroid disease    hypothyroidism   Social History   Socioeconomic History   Marital status: Single    Spouse name: Not on file   Number of children: 0   Years of education: 16   Highest education level: Bachelor's degree (e.g., BA, AB, BS)  Occupational History   Occupation: retired    Comment: Chief Strategy Officer  Tobacco Use   Smoking status: Never   Smokeless tobacco: Never  Vaping Use   Vaping Use: Never used  Substance and Sexual Activity   Alcohol use: Yes    Alcohol/week: 1.0 standard drink of alcohol    Types: 1 Glasses of wine per week    Comment: occasionally   Drug use: Never   Sexual activity: Not Currently    Partners: Male    Birth control/protection: Surgical  Other Topics Concern   Not on file  Social History Narrative   Lives  alone.  No children   Her brother and sister live nearby   Social Determinants of Health   Financial Resource Strain: Low Risk  (10/07/2021)   Overall Financial Resource Strain (CARDIA)    Difficulty of Paying Living Expenses: Not hard at all  Food Insecurity: No Food Insecurity (10/07/2021)   Hunger Vital Sign    Worried About Running Out of Food in the Last Year: Never true    Ran Out of Food in the Last Year: Never true  Transportation Needs: No Transportation Needs (10/07/2021)   PRAPARE - Administrator, Civil Service (Medical): No     Lack of Transportation (Non-Medical): No  Physical Activity: Sufficiently Active (10/07/2021)   Exercise Vital Sign    Days of Exercise per Week: 6 days    Minutes of Exercise per Session: 60 min  Stress: No Stress Concern Present (10/07/2021)   [employer] of Occupational Health - Occupational Stress Questionnaire    Feeling of Stress : Not at all  Social Connections: Moderately Integrated (10/07/2021)   Social Connection and Isolation Panel [NHANES]    Frequency of Communication with Friends and Family: More than three times a week    Frequency of Social Gatherings with Friends and Family: More than three times a week    Attends Religious Services: More than 4 times per year    Active Member of Clubs or Organizations: Yes    Attends Banker Meetings: More than 4 times per year    Marital Status: Never married  Intimate Partner Violence: Not At Risk (10/07/2021)   Humiliation, Afraid, Rape, and Kick questionnaire    Fear of Current or Ex-Partner: No    Emotionally Abused: No    Physically Abused: No    Sexually Abused: No   Past Surgical History:  Procedure Laterality Date   ABDOMINAL HYSTERECTOMY  2002   complete for plyps   BREAST LUMPECTOMY WITH RADIOACTIVE SEED LOCALIZATION Right 08/22/2020   Procedure: RIGHT BREAST LUMPECTOMY WITH RADIOACTIVE SEED LOCALIZATION;  Surgeon: Almond Lint, MD;  Location: [facility] OR;  Service: General;  Laterality: Right;   ESOPHAGOGASTRODUODENOSCOPY N/A 09/30/2018   Procedure: ESOPHAGOGASTRODUODENOSCOPY (EGD);  Surgeon: Rachael Fee, MD;  Location: [facility];  Service: Endoscopy;  Laterality: N/A;   EUS N/A 09/30/2018   Procedure: UPPER ENDOSCOPIC ULTRASOUND (EUS) RADIAL;  Surgeon: Rachael Fee, MD;  Location: [facility];  Service: Endoscopy;  Laterality: N/A;   LIVER BIOPSY     normal result   thryoid needle biopsy  1990's   THYROIDECTOMY  1982   Family History  Problem Relation Age of Onset   High Cholesterol Mother     Hypertension Mother    Goiter Mother    High Cholesterol Father    Hypertension Father    Hypertension Brother    Lung disease Brother        chronic smoker   Heart disease Brother    Goiter Sister    Heart disease Brother    Hypertension Brother    Prostate cancer Brother 72   Lung cancer Maternal Uncle        smoker   Pancreatic cancer Nephew        dx. 40s/50s   Cancer Nephew        pancreatic vs. multiple myeloma; dx. 40s/50s   Colon cancer Neg Hx    Stomach cancer Neg Hx     Current Outpatient Medications:    anastrozole (ARIMIDEX) 1 MG tablet, Take 1 tablet (  1 mg total) by mouth daily., Disp: 90 tablet, Rfl: 3   Coenzyme Q10-Vitamin E (QUNOL ULTRA COQ10 PO), Take 1 capsule by mouth daily after lunch. , Disp: , Rfl:    Krill Oil 500 MG CAPS, Take 500 mg by mouth daily after lunch. , Disp: , Rfl:    levothyroxine (SYNTHROID) 75 MCG tablet, Take 1 tablet (75 mcg total) by mouth daily before breakfast., Disp: 90 tablet, Rfl: 3   lisinopril (ZESTRIL) 10 MG tablet, Take 1 tablet (10 mg total) by mouth every evening., Disp: 90 tablet, Rfl: 3   lisinopril-hydrochlorothiazide (ZESTORETIC) 20-12.5 MG tablet, Take 1 tablet by mouth in the morning., Disp: 90 tablet, Rfl: 3   Magnesium 250 MG TABS, Take 250 mg by mouth daily after lunch. , Disp: , Rfl:    Multiple Vitamin (MULTIVITAMIN WITH MINERALS) TABS tablet, Take 1 tablet by mouth daily after lunch. Women's One A Day Multivitamin , Disp: , Rfl:    simvastatin (ZOCOR) 20 MG tablet, Take 1 tablet (20 mg total) by mouth daily., Disp: 90 tablet, Rfl: 3  No Known Allergies   ROS: Review of Systems Pertinent items noted in HPI and remainder of comprehensive ROS otherwise negative.    Physical exam BP (!) 148/89   Pulse 80   Temp 98.7 F (37.1 C)   Ht 5\' 6"  (1.676 m)   Wt 156 lb (70.8 kg)   SpO2 97%   BMI 25.18 kg/m  General appearance: alert, cooperative, appears stated age, and no distress Head: Normocephalic, without  obvious abnormality, atraumatic Eyes: negative findings: lids and lashes normal, conjunctivae and sclerae normal, corneas clear, and pupils equal, round, reactive to light and accomodation Ears: normal TM's and external ear canals both ears Nose: Nares normal. Septum midline. Mucosa normal. No drainage or sinus tenderness. Throat: lips, mucosa, and tongue normal; teeth and gums normal Neck: no adenopathy, no carotid bruit, supple, symmetrical, trachea midline, and thyroid not enlarged, symmetric, no tenderness/mass/nodules Back: symmetric, no curvature. ROM normal. No CVA tenderness. Lungs: clear to auscultation bilaterally Heart: regular rate and rhythm, S1, S2 normal, no murmur, click, rub or gallop Abdomen: soft, non-tender; bowel sounds normal; no masses,  no organomegaly Extremities: extremities normal, atraumatic, no cyanosis or edema Pulses: 2+ and symmetric Skin:  Multiple pigmented nevi/lentigo Lymph nodes: Cervical, supraclavicular, and axillary nodes normal. Neurologic: Follows commands, conversive. Psych: Pleasant, interactive.  Does not appear to be responding to internal stimuli     03/14/2023    8:37 AM 10/13/2022   12:14 PM 07/15/2018    8:16 AM  MMSE - Mini Mental State Exam  Orientation to time 1 4 5   Orientation to Place 5 5 5   Registration 2 3 3   Attention/ Calculation 3 3 5   Recall 3 0 3  Language- name 2 objects 2 2 2   Language- repeat 1 0 1  Language- follow 3 step command 3 3 3   Language- read & follow direction 1 1 1   Write a sentence 1 1 1   Copy design 1 1 1   Total score 23 23 30     Assessment/ Plan: Christine Cantrell here for annual physical exam.   Annual physical exam  Essential hypertension - Plan: CMP14+EGFR  Cerebral atrophy (HCC) - Plan: CMP14+EGFR  MCI (mild cognitive impairment) with memory loss - Plan: CMP14+EGFR, memantine (NAMENDA) 5 MG tablet  Mixed hyperlipidemia - Plan: CMP14+EGFR, Lipid Panel  Postoperative hypothyroidism - Plan:  TSH, T4, Free  Elevated serum glucose - Plan: Bayer DCA Hb A1c  Waived  Blood pressure better upon recheck.  We discussed resuming her nighttime low-dose lisinopril.  Continue daytime lisinopril combo with HCTZ  Start Namenda 5 mg twice daily.  We will advance to 10 mg at next visit pending tolerance.  Check renal function, liver enzymes  Check fasting lipid.  Continue statin  Check thyroid levels.  Continue replacement  A1c did not demonstrate diabetes today.  Counseled on healthy lifestyle choices, including diet (rich in fruits, vegetables and lean meats and low in salt and simple carbohydrates) and exercise (at least 30 minutes of moderate physical activity daily).  Patient to follow up in 2-4 months for memory recheck  Amaury Kuzel M. Nadine Counts, DO

## 2023-03-14 LAB — CMP14+EGFR
ALT: 18 IU/L (ref 0–32)
AST: 22 IU/L (ref 0–40)
Albumin/Globulin Ratio: 1.8 (ref 1.2–2.2)
Albumin: 4.4 g/dL (ref 3.8–4.8)
Alkaline Phosphatase: 37 IU/L — ABNORMAL LOW (ref 44–121)
BUN/Creatinine Ratio: 10 — ABNORMAL LOW (ref 12–28)
BUN: 9 mg/dL (ref 8–27)
Bilirubin Total: 0.6 mg/dL (ref 0.0–1.2)
CO2: 25 mmol/L (ref 20–29)
Calcium: 9.5 mg/dL (ref 8.7–10.3)
Chloride: 99 mmol/L (ref 96–106)
Creatinine, Ser: 0.87 mg/dL (ref 0.57–1.00)
Globulin, Total: 2.4 g/dL (ref 1.5–4.5)
Glucose: 126 mg/dL — ABNORMAL HIGH (ref 70–99)
Potassium: 4.4 mmol/L (ref 3.5–5.2)
Sodium: 138 mmol/L (ref 134–144)
Total Protein: 6.8 g/dL (ref 6.0–8.5)
eGFR: 68 mL/min/{1.73_m2} (ref >59)

## 2023-03-14 LAB — T4, FREE: Free T4: 1.19 ng/dL (ref 0.82–1.77)

## 2023-03-14 LAB — LIPID PANEL
Chol/HDL Ratio: 2.4 ratio (ref 0.0–4.4)
Cholesterol, Total: 243 mg/dL — ABNORMAL HIGH (ref 100–199)
HDL: 102 mg/dL (ref >39)
LDL Chol Calc (NIH): 114 mg/dL — ABNORMAL HIGH (ref 0–99)
Triglycerides: 161 mg/dL — ABNORMAL HIGH (ref 0–149)
VLDL Cholesterol Cal: 27 mg/dL (ref 5–40)

## 2023-03-14 LAB — TSH: TSH: 1.31 u[IU]/mL (ref 0.450–4.500)

## 2023-04-01 ENCOUNTER — Telehealth: Payer: Self-pay | Admitting: Family Medicine

## 2023-04-01 NOTE — Telephone Encounter (Signed)
I have spoken to the patient and her brother multiple times.  She does not demonstrate danger to herself or others.  I cannot simply fax her medical record without her permission to an outside facility.  If her friend feels Adult Protective Services needs to be involved, she can absolutely file an anonymous report herself and they will go out and assess her independently.  Patient is not driving.  Her brother has brought her to last appointments.

## 2023-04-04 ENCOUNTER — Other Ambulatory Visit: Payer: Self-pay | Admitting: Family Medicine

## 2023-04-04 DIAGNOSIS — G3184 Mild cognitive impairment, so stated: Secondary | ICD-10-CM

## 2023-04-15 ENCOUNTER — Telehealth: Payer: Self-pay | Admitting: Family Medicine

## 2023-04-15 NOTE — Telephone Encounter (Signed)
Judeth Cornfield called from DSS to speak with pts PCP or nurse. Has concerns about patient.  Can call stephanie at [phone] ext [phone]

## 2023-04-16 NOTE — Telephone Encounter (Signed)
Attempted to call , if she calls back please get additional information , questions or concerns so we can fwd to pcp

## 2023-04-20 NOTE — Telephone Encounter (Signed)
Concerns about memory and patient is not taking her medication properly. Would like to speak to PCP or nurse about this and discuss getting a "bubble pack" for medication so it is easier for her to remember. Please call back.

## 2023-04-21 ENCOUNTER — Other Ambulatory Visit: Payer: Self-pay | Admitting: Pharmacist

## 2023-04-21 DIAGNOSIS — I10 Essential (primary) hypertension: Secondary | ICD-10-CM

## 2023-04-21 NOTE — Progress Notes (Signed)
   04/21/2023 Name: Christine Cantrell MRN: [id number] DOB: 05-07-1946   PCP message sent to discuss medication management efforts.  Discussed pill packs and medication review/reconciliation performed.  Will place [id number] to set up pill packaging with the local pharmacy.  Patient has [facility] medicare.  No allergies noted at this time.  Patient is struggling with mild cognitive impairment, therefore DPRs noted on file: Patient authorizes [facility] to release information to:  Gibson Ramp, friend, [phone]  and Lesha Jager, brother, at [phone].  May leave voicemail on home answering machine at [phone].  Will continue to follow with patient & caregivers.  Kieth Brightly, PharmD, BCACP Clinical Pharmacist, [facility]

## 2023-04-23 ENCOUNTER — Telehealth: Payer: Self-pay

## 2023-04-23 NOTE — Progress Notes (Signed)
  Care Coordination  Note  04/23/2023 Name: Christine Cantrell MRN: [id number] DOB: 09-27-1946  Christine Cantrell is a 77 y.o. year old female who is a primary care patient of Raliegh Ip, DO. I reached out to Christine Cantrell by phone today to offer care coordination services.      Christine Cantrell was given information about Care Coordination services today including:  The Care Coordination services include support from the care team which includes your Nurse Coordinator, Clinical Social Worker, or Pharmacist.  The Care Coordination team is here to help remove barriers to the health concerns and goals most important to you. Care Coordination services are voluntary and the patient may decline or stop services at any time by request to their care team member.   Patient agreed to services and verbal consent obtained.   Follow up plan: Telephone appointment with care coordination team member scheduled for:05/07/2023  Christine Cantrell, RMA Care Guide [facility]  [place], [place] [address] Direct Dial: [phone] [email or web address]

## 2023-05-01 ENCOUNTER — Telehealth: Payer: Self-pay

## 2023-05-01 NOTE — Telephone Encounter (Signed)
[facility] Child psychotherapist called stated she has tried to reach the office multiple times - there was no calls documented   Is concerned about pts memory - wanting to do a bubble pack for her medications to help her with taking them   Pt has a f/u apt for memory next month

## 2023-05-06 ENCOUNTER — Telehealth: Payer: Self-pay | Admitting: Family Medicine

## 2023-05-06 DIAGNOSIS — G3184 Mild cognitive impairment, so stated: Secondary | ICD-10-CM

## 2023-05-06 NOTE — Telephone Encounter (Signed)
NA/NVM this was sent to the pharmacy last month on 04/06/23 for a 3 mos supply

## 2023-05-06 NOTE — Telephone Encounter (Signed)
  Prescription Request  05/06/2023  Is this a "Controlled Substance" medicine? NO  Have you seen your PCP in the last 2 weeks? NO  If YES, route message to pool  -  If NO, patient needs to be scheduled for appointment.  What is the name of the medication or equipment? Memantine 5 mg and Aricept  Have you contacted your pharmacy to request a refill? no   Which pharmacy would you like this sent to? [facility] in [place]   Patient notified that their request is being sent to the clinical staff for review and that they should receive a response within 2 business days.

## 2023-05-07 ENCOUNTER — Ambulatory Visit (INDEPENDENT_AMBULATORY_CARE_PROVIDER_SITE_OTHER): Payer: Medicare Other | Admitting: Pharmacist

## 2023-05-07 ENCOUNTER — Telehealth: Payer: Self-pay | Admitting: Family Medicine

## 2023-05-07 ENCOUNTER — Encounter: Payer: Self-pay | Admitting: Pharmacist

## 2023-05-07 DIAGNOSIS — Z0000 Encounter for general adult medical examination without abnormal findings: Secondary | ICD-10-CM

## 2023-05-07 NOTE — Progress Notes (Signed)
   05/07/2023 Name: Christine Cantrell MRN: [id number] DOB: 1946/07/06   Patient declined help and stated her medications were "just fine".  I called DPRs on file with no answer.  Please let me know if I can help this patient and who we need to contact.  Arranging pill packs will need to involve the patient and family (or SW).  It is a very detailed process that sometimes takes months to sync.  We would also have to switch pharmacies and there is a potential fee involved.  I'm happy to help in the future as needed.   Kieth Brightly, PharmD, BCACP Clinical Pharmacist, [facility]

## 2023-05-07 NOTE — Telephone Encounter (Signed)
Patient declined help and stated her medications were "just fine".  I called DPRs on file with no answer.  Please let me know if I can help this patient and who we need to contact.  Arranging pill packs will need to involve the patient and family (or SW/DSS).  It is a very detailed process that sometimes takes months to sync.  We would also have to switch pharmacies and there is a potential fee involved.  I'm happy to help in the future as needed.

## 2023-05-11 NOTE — Telephone Encounter (Signed)
If DSS calls again, please take down name and number of social worker and I can call back as long as this is compliant with HIPAA.  Will cc to Toniann Fail to verify.

## 2023-05-11 NOTE — Telephone Encounter (Signed)
I have not gotten any information from DSS but I think Christine Cantrell may have received a call recently?

## 2023-05-11 NOTE — Telephone Encounter (Signed)
Attempted to call no answer - there was no call document showing anyone has called her

## 2023-05-12 ENCOUNTER — Telehealth: Payer: Self-pay | Admitting: Family Medicine

## 2023-05-12 NOTE — Telephone Encounter (Signed)
I just wanted to note this information in the chart for Dr Nadine Counts.  Patients memory is getting worse. She calls the office almost everyday to see what day and time her appt is scheduled for. Yesterday she came in and was given an appt card and call at least twice that I know of. Today she has called the office 4 times that I know of. These are just 2 recent examples but this has been going on for a while now. Just seems to be getting worse. Just wanted to make sure Dr Nadine Counts is aware.

## 2023-05-12 NOTE — Telephone Encounter (Signed)
Called patient to schedule Medicare Annual Wellness Visit (AWV). No voicemail available to leave a message.  Last date of AWV: 10/07/2021   Please schedule an appointment at any time with Vernona Rieger, [facility]. .  If any questions, please contact me at [phone].  Thank you,  Judeth Cornfield,  AMB Clinical Support [facility] AWV Program Direct Dial ??[phone]

## 2023-05-13 NOTE — Telephone Encounter (Signed)
Kelci, can we get her in sooner?

## 2023-05-13 NOTE — Telephone Encounter (Signed)
I have spoken with pts son Maurine Minister. He was made aware that pt came in the office and an appt card was given and that she called the office multiple times to confirm.  Son declined making an appt at this time and would like to discuss with pt if she would like an appt for her memory.  Pt next appt is 6/17 for 58m follow up and to discuss memory. Will send to Dr. Nadine Counts to make aware.

## 2023-05-15 ENCOUNTER — Encounter: Payer: Medicare Other | Admitting: Family Medicine

## 2023-06-15 ENCOUNTER — Ambulatory Visit: Payer: Medicare Other | Admitting: Family Medicine

## 2023-06-15 ENCOUNTER — Ambulatory Visit (INDEPENDENT_AMBULATORY_CARE_PROVIDER_SITE_OTHER): Payer: Medicare Other | Admitting: Family Medicine

## 2023-06-15 ENCOUNTER — Encounter: Payer: Self-pay | Admitting: Family Medicine

## 2023-06-15 VITALS — BP 153/83 | HR 77 | Temp 98.5°F | Ht 66.0 in | Wt 157.0 lb

## 2023-06-15 DIAGNOSIS — G3184 Mild cognitive impairment, so stated: Secondary | ICD-10-CM

## 2023-06-15 DIAGNOSIS — G319 Degenerative disease of nervous system, unspecified: Secondary | ICD-10-CM

## 2023-06-15 MED ORDER — MEMANTINE HCL 5 MG PO TABS: 5.0000 mg | ORAL_TABLET | Freq: Two times a day (BID) | ORAL | 3 refills | Status: DC

## 2023-06-15 NOTE — Progress Notes (Signed)
Subjective: CC: Follow-up [facility] PCP: Christine Ip, DO ZOX:WRUEA Cantrell is a 77 y.o. female presenting to clinic today for:  1.  Mild cognitive impairment Patient arrives to the office by herself today.  She notes that she has been doing well since her last visit.  She is compliant with all medications.  She continues to live independently with her brother just down the road.  She continues to stay active, denies any issues with mobility, continence or other IADLs and ADLs.  She denies need for any additional services at home right now.  She admits that she has not been as physically active since her last visit as she had hoped to be and plans on starting back on a walking regimen.  She continues to stay in contact with Christine Cantrell, her friend that lives in the [place].   ROS: Per HPI  No Known Allergies Past Medical History:  Diagnosis Date   Abnormal EKG saw dr hochrein 3 yrs ago for   hx of left bundle branch block on ekg since 2002   Breast cancer [facility])    Depression    Diverticulosis large intestine w/o perforation or abscess w/bleeding 2016   Family history of lung cancer    Family history of pancreatic cancer    Family history of prostate cancer    GERD (gastroesophageal reflux disease)    hx of   High cholesterol    History of hiatal hernia    Hx of colonic polyps 2016   adenomatous   Hypertension    Hypothyroidism    Pancreatic cyst    Thyroid disease    hypothyroidism    Current Outpatient Medications:    anastrozole (ARIMIDEX) 1 MG tablet, Take 1 tablet (1 mg total) by mouth daily., Disp: 90 tablet, Rfl: 3   Coenzyme Q10-Vitamin E (QUNOL ULTRA COQ10 PO), Take 1 capsule by mouth daily after lunch. , Disp: , Rfl:    Krill Oil 500 MG CAPS, Take 500 mg by mouth daily after lunch. , Disp: , Rfl:    levothyroxine (SYNTHROID) 75 MCG tablet, Take 1 tablet (75 mcg total) by mouth daily before breakfast., Disp: 90 tablet, Rfl: 3   lisinopril (ZESTRIL) 10 MG tablet,  Take 1 tablet (10 mg total) by mouth every evening., Disp: 90 tablet, Rfl: 3   lisinopril-hydrochlorothiazide (ZESTORETIC) 20-12.5 MG tablet, Take 1 tablet by mouth in the morning., Disp: 90 tablet, Rfl: 3   Magnesium 250 MG TABS, Take 250 mg by mouth daily after lunch. , Disp: , Rfl:    memantine (NAMENDA) 5 MG tablet, TAKE 1 TABLET (5 MG TOTAL) BY MOUTH 2 (TWO) TIMES DAILY. FOR MEMORY, Disp: 180 tablet, Rfl: 0   Multiple Vitamin (MULTIVITAMIN WITH MINERALS) TABS tablet, Take 1 tablet by mouth daily after lunch. Women's One A Day Multivitamin , Disp: , Rfl:    simvastatin (ZOCOR) 20 MG tablet, Take 1 tablet (20 mg total) by mouth daily., Disp: 90 tablet, Rfl: 3 Social History   Socioeconomic History   Marital status: Single    Spouse name: Not on file   Number of children: 0   Years of education: 16   Highest education level: Bachelor's degree (e.g., BA, AB, BS)  Occupational History   Occupation: retired    Comment: Chief Strategy Officer  Tobacco Use   Smoking status: Never   Smokeless tobacco: Never  Vaping Use   Vaping Use: Never used  Substance and Sexual Activity   Alcohol use: Yes  Alcohol/week: 1.0 standard drink of alcohol    Types: 1 Glasses of wine per week    Comment: occasionally   Drug use: Never   Sexual activity: Not Currently    Partners: Male    Birth control/protection: Surgical  Other Topics Concern   Not on file  Social History Narrative   Lives alone.  No children   Her brother and sister live nearby   Social Determinants of Health   Financial Resource Strain: Low Risk  (10/07/2021)   Overall Financial Resource Strain (CARDIA)    Difficulty of Paying Living Expenses: Not hard at all  Food Insecurity: No Food Insecurity (10/07/2021)   Hunger Vital Sign    Worried About Running Out of Food in the Last Year: Never true    Ran Out of Food in the Last Year: Never true  Transportation Needs: No Transportation Needs (10/07/2021)   PRAPARE - Therapist, art (Medical): No    Lack of Transportation (Non-Medical): No  Physical Activity: Sufficiently Active (10/07/2021)   Exercise Vital Sign    Days of Exercise per Week: 6 days    Minutes of Exercise per Session: 60 min  Stress: No Stress Concern Present (10/07/2021)   [employer] of Occupational Health - Occupational Stress Questionnaire    Feeling of Stress : Not at all  Social Connections: Moderately Integrated (10/07/2021)   Social Connection and Isolation Panel [NHANES]    Frequency of Communication with Friends and Family: More than three times a week    Frequency of Social Gatherings with Friends and Family: More than three times a week    Attends Religious Services: More than 4 times per year    Active Member of [employer] or Organizations: Yes    Attends Engineer, structural: More than 4 times per year    Marital Status: Never married  Intimate Partner Violence: Not At Risk (10/07/2021)   Humiliation, Afraid, Rape, and Kick questionnaire    Fear of Current or Ex-Partner: No    Emotionally Abused: No    Physically Abused: No    Sexually Abused: No   Family History  Problem Relation Age of Onset   High Cholesterol Mother    Hypertension Mother    Goiter Mother    High Cholesterol Father    Hypertension Father    Hypertension Brother    Lung disease Brother        chronic smoker   Heart disease Brother    Goiter Sister    Heart disease Brother    Hypertension Brother    Prostate cancer Brother 72   Lung cancer Maternal Uncle        smoker   Pancreatic cancer Nephew        dx. 40s/50s   Cancer Nephew        pancreatic vs. multiple myeloma; dx. 40s/50s   Colon cancer Neg Hx    Stomach cancer Neg Hx     Objective: Office vital signs reviewed. BP (!) 153/83   Pulse 77   Temp 98.5 F (36.9 C)   Ht 5\' 6"  (1.676 m)   Wt 157 lb (71.2 kg)   SpO2 96%   BMI 25.34 kg/m   Physical Examination:  General: Awake, alert, well nourished,  No acute distress HEENT: sclera white, MMM Cardio: regular rate and rhythm, S1S2 heard, no murmurs appreciated Pulm: clear to auscultation bilaterally, no wheezes, rhonchi or rales; normal work of breathing on room air Neuro:  see below     06/15/2023    1:01 PM 03/14/2023    8:37 AM 10/13/2022   12:14 PM  MMSE - Mini Mental State Exam  Orientation to time 2 1 4   Orientation to Place 5 5 5   Registration 2 2 3   Attention/ Calculation 5 3 3   Recall 2 3 0  Language- name 2 objects 2 2 2   Language- repeat 1 1 0  Language- follow 3 step command 2 3 3   Language- read & follow direction 1 1 1   Write a sentence 1 1 1   Copy design 1 1 1   Total score 24 23 23     Assessment/ Plan: 77 y.o. female   MCI (mild cognitive impairment) with memory loss - Plan: memantine (NAMENDA) 5 MG tablet  Cerebral atrophy (HCC)  MCI is stable.  Tolerating Namenda without difficulty.  No plans for change to regimen at this time.  We discussed home needs which were none at this time.  I know that her friend is very involved in her medical care and I discussed with the patient that my goal is to keep her as independent as possible as long as she is safe to do so.  I see no barriers to her being home and continuing self-care with close monitoring at this time.  I will see her back again in the next 4 to 6 months, sooner if concerns arise  No orders of the defined types were placed in this encounter.  No orders of the defined types were placed in this encounter.    Christine Ip, DO [facility] [phone]

## 2023-06-29 ENCOUNTER — Other Ambulatory Visit: Payer: Self-pay | Admitting: Family Medicine

## 2023-06-30 DIAGNOSIS — Z1231 Encounter for screening mammogram for malignant neoplasm of breast: Secondary | ICD-10-CM | POA: Diagnosis not present

## 2023-07-06 ENCOUNTER — Other Ambulatory Visit: Payer: Self-pay | Admitting: Hematology and Oncology

## 2023-07-08 NOTE — Progress Notes (Signed)
Patient Care Team: Raliegh Ip, DO as PCP - General (Family Medicine) Almond Lint, MD as Consulting Physician (General Surgery) Serena Croissant, MD as Consulting Physician (Hematology and Oncology) Antony Blackbird, MD as Consulting Physician (Radiation Oncology) Manning Charity, OD as Referring Physician (Optometry)  DIAGNOSIS: No diagnosis found.  SUMMARY OF ONCOLOGIC HISTORY: Oncology History  Malignant neoplasm of upper-inner quadrant of right breast in female, estrogen receptor positive (HCC)  07/06/2020 Initial Diagnosis   Screening mammogram detected a 0.8cm right breast mass. Diagnostic mammogram showed the mass measuring 0.7cm, with no right axillary adenopathy. 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   07/11/2020 Cancer Staging   Staging form: Breast, AJCC 8th Edition - Clinical stage from 07/11/2020: Stage IA (cT1b, cN0, cM0, G2, ER+, PR+, HER2-)   08/22/2020 Surgery   Right lumpectomy Christine Cantrell) ([id number]): IDC, 1.1cm, grade 2, clear margins. No regional lymph nodes were examined.   08/2020 - 08/2025 Anti-estrogen oral therapy   Anatrozole   11/04/2020 Cancer Staging   Staging form: Breast, AJCC 8th Edition - Pathologic: Stage IA (pT1c, pN0, cM0, G2, ER+, PR+, HER2-)     CHIEF COMPLIANT:   INTERVAL HISTORY: Christine Cantrell is a   ALLERGIES:  has No Known Allergies.  MEDICATIONS:  Current Outpatient Medications  Medication Sig Dispense Refill   anastrozole (ARIMIDEX) 1 MG tablet TAKE 1 TABLET BY MOUTH EVERY DAY 90 tablet 3   Coenzyme Q10-Vitamin E (QUNOL ULTRA COQ10 PO) Take 1 capsule by mouth daily after lunch.      Krill Oil 500 MG CAPS Take 500 mg by mouth daily after lunch.      levothyroxine (SYNTHROID) 75 MCG tablet TAKE 1 TABLET BY MOUTH DAILY BEFORE BREAKFAST. 90 tablet 2   lisinopril (ZESTRIL) 10 MG tablet Take 1 tablet (10 mg total) by mouth every evening. 90 tablet 3   lisinopril-hydrochlorothiazide  (ZESTORETIC) 20-12.5 MG tablet Take 1 tablet by mouth in the morning. 90 tablet 3   Magnesium 250 MG TABS Take 250 mg by mouth daily after lunch.      memantine (NAMENDA) 5 MG tablet Take 1 tablet (5 mg total) by mouth 2 (two) times daily. For memory 180 tablet 3   Multiple Vitamin (MULTIVITAMIN WITH MINERALS) TABS tablet Take 1 tablet by mouth daily after lunch. Women's One A Day Multivitamin      simvastatin (ZOCOR) 20 MG tablet Take 1 tablet (20 mg total) by mouth daily. 90 tablet 3   No current facility-administered medications for this visit.    PHYSICAL EXAMINATION: ECOG PERFORMANCE STATUS: {CHL ONC ECOG PS:[phone]}  There were no vitals filed for this visit. There were no vitals filed for this visit.  BREAST:*** No palpable masses or nodules in either right or left breasts. No palpable axillary supraclavicular or infraclavicular adenopathy no breast tenderness or nipple discharge. (exam performed in the presence of a chaperone)  LABORATORY DATA:  I have reviewed the data as listed    Latest Ref Rng & Units 03/13/2023   11:06 AM 10/13/2022    2:21 PM 07/31/2021    9:00 AM  CMP  Glucose 70 - 99 mg/dL 161  096  045   BUN 8 - 27 mg/dL 9  11  13    Creatinine 0.57 - 1.00 mg/dL 4.09  8.11  9.14   Sodium 134 - 144 mmol/L 138  144  135   Potassium 3.5 - 5.2 mmol/L 4.4  4.3  4.6  Chloride 96 - 106 mmol/L 99  97  95   CO2 20 - 29 mmol/L 25  18  23    Calcium 8.7 - 10.3 mg/dL 9.5  9.9  9.6   Total Protein 6.0 - 8.5 g/dL 6.8  8.1  6.8   Total Bilirubin 0.0 - 1.2 mg/dL 0.6  0.7  0.9   Alkaline Phos 44 - 121 IU/L 37  44  33   AST 0 - 40 IU/L 22  35  30   ALT 0 - 32 IU/L 18  29  26      Lab Results  Component Value Date   WBC 6.4 10/13/2022   HGB 17.5 (H) 10/13/2022   HCT 49.6 (H) 10/13/2022   MCV 97 10/13/2022   PLT 174 10/13/2022   NEUTROABS 3.9 07/11/2020    ASSESSMENT & PLAN:  No problem-specific Assessment & Plan notes found for this encounter.    No orders of the  defined types were placed in this encounter.  The patient has a good understanding of the overall plan. she agrees with it. she will call with any problems that may develop before the next visit here. Total time spent: 30 mins including face to face time and time spent for planning, charting and co-ordination of care   Sherlyn Lick, CMA 07/08/23    [name]  ***

## 2023-07-09 ENCOUNTER — Other Ambulatory Visit: Payer: Self-pay

## 2023-07-09 ENCOUNTER — Inpatient Hospital Stay: Payer: Medicare Other | Attending: Hematology and Oncology | Admitting: Hematology and Oncology

## 2023-07-09 ENCOUNTER — Encounter: Payer: Self-pay | Admitting: Family Medicine

## 2023-07-09 VITALS — BP 147/65 | HR 104 | Temp 97.8°F | Resp 16 | Wt 156.9 lb

## 2023-07-09 DIAGNOSIS — Z170 Estrogen receptor positive status [ER+]: Secondary | ICD-10-CM | POA: Diagnosis not present

## 2023-07-09 DIAGNOSIS — C50211 Malignant neoplasm of upper-inner quadrant of right female breast: Secondary | ICD-10-CM | POA: Insufficient documentation

## 2023-07-09 DIAGNOSIS — Z79899 Other long term (current) drug therapy: Secondary | ICD-10-CM | POA: Insufficient documentation

## 2023-07-09 DIAGNOSIS — Z79811 Long term (current) use of aromatase inhibitors: Secondary | ICD-10-CM | POA: Insufficient documentation

## 2023-07-09 DIAGNOSIS — Z780 Asymptomatic menopausal state: Secondary | ICD-10-CM | POA: Diagnosis not present

## 2023-07-09 NOTE — Assessment & Plan Note (Signed)
07/06/2020:Screening mammogram detected a 0.8cm right breast mass. Diagnostic mammogram showed the mass measuring 0.7cm, with no right axillary adenopathy. 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 T1BN0 stage Ia   08/22/2020:Right lumpectomy ([facility]): IDC, 1.1cm, grade 2, clear margins.  ER 95%, PR 95%, Ki-67 30%, IHC equivocal, FISH negative Refused radiation therapy.   Treatment plan: adjuvant antiestrogen therapy with anastrozole 1 mg daily x5 years started 09/04/2020   Anastrozole Toxicities: Denies any hot flashes or arthralgias or myalgias.   Breast Cancer Surveillance: 1. Breast Exam 07/09/2023: Benign 2. Mammogram: 06/25/2022 and [facility]: Benign  MRCP 11/27/2022: Stable simple cystic lesion posterior aspect of pancreatic head similar to 2019 compatible with small pancreatic pseudocyst (follows with Dr. Adela Lank)   Return to clinic in 1 year for follow-up

## 2023-10-04 ENCOUNTER — Other Ambulatory Visit: Payer: Self-pay | Admitting: Family Medicine

## 2023-10-04 DIAGNOSIS — I10 Essential (primary) hypertension: Secondary | ICD-10-CM

## 2023-10-05 NOTE — Telephone Encounter (Signed)
She was taking the combo in am and plain at night.  To my knowledge she has continued to do this.

## 2023-10-06 ENCOUNTER — Other Ambulatory Visit: Payer: Self-pay | Admitting: *Deleted

## 2023-10-14 DIAGNOSIS — H02834 Dermatochalasis of left upper eyelid: Secondary | ICD-10-CM | POA: Diagnosis not present

## 2023-10-14 DIAGNOSIS — H5213 Myopia, bilateral: Secondary | ICD-10-CM | POA: Diagnosis not present

## 2023-10-14 DIAGNOSIS — H2513 Age-related nuclear cataract, bilateral: Secondary | ICD-10-CM | POA: Diagnosis not present

## 2023-10-14 DIAGNOSIS — H02831 Dermatochalasis of right upper eyelid: Secondary | ICD-10-CM | POA: Diagnosis not present

## 2023-10-14 DIAGNOSIS — H353131 Nonexudative age-related macular degeneration, bilateral, early dry stage: Secondary | ICD-10-CM | POA: Diagnosis not present

## 2023-10-23 ENCOUNTER — Telehealth: Payer: Self-pay | Admitting: Family Medicine

## 2023-11-02 ENCOUNTER — Telehealth: Payer: Self-pay

## 2023-11-02 ENCOUNTER — Ambulatory Visit: Payer: Medicare Other

## 2023-11-02 VITALS — Ht 64.0 in | Wt 156.0 lb

## 2023-11-02 DIAGNOSIS — Z0000 Encounter for general adult medical examination without abnormal findings: Secondary | ICD-10-CM

## 2023-11-02 DIAGNOSIS — K862 Cyst of pancreas: Secondary | ICD-10-CM

## 2023-11-02 NOTE — Patient Instructions (Signed)
Ms. Elks , Thank you for taking time to come for your Medicare Wellness Visit. I appreciate your ongoing commitment to your health goals. Please review the following plan we discussed and let me know if I can assist you in the future.   Referrals/Orders/Follow-Ups/Clinician Recommendations: Aim for 30 minutes of exercise or brisk walking, 6-8 glasses of water, and 5 servings of fruits and vegetables each day.   This is a list of the screening recommended for you and due dates:  Health Maintenance  Topic Date Due   Colon Cancer Screening  03/27/2022   Flu Shot  07/30/2023   COVID-19 Vaccine (6 - 2023-24 season) 08/30/2023   Medicare Annual Wellness Visit  11/01/2024   Mammogram  06/29/2025   DEXA scan (bone density measurement)  09/05/2025   Pneumonia Vaccine  Completed   Hepatitis C Screening  Completed   Zoster (Shingles) Vaccine  Completed   HPV Vaccine  Aged Out   DTaP/Tdap/Td vaccine  Discontinued    Advanced directives: (Provided) Advance directive discussed with you today. I have provided a copy for you to complete at home and have notarized. Once this is complete, please bring a copy in to our office so we can scan it into your chart. Information on Advanced Care Planning can be found at [facility] Advance Health Care Directives Advance Health Care Directives ([email or web address]    Next Medicare Annual Wellness Visit scheduled for next year: Yes  Insert Preventive Care attachment Insert FALL PREVENTION attachment if needed

## 2023-11-02 NOTE — Progress Notes (Signed)
Subjective:   Christine Cantrell is a 77 y.o. female who presents for Medicare Annual (Subsequent) preventive examination.  Visit Complete: Virtual I connected with  Christine Cantrell on 11/02/23 by a audio enabled telemedicine application and verified that I am speaking with the correct person using two identifiers.  Patient Location: Home  Provider Location: Home Office  I discussed the limitations of evaluation and management by telemedicine. The patient expressed understanding and agreed to proceed.  Vital Signs: Because this visit was a virtual/telehealth visit, some criteria may be missing or patient reported. Any vitals not documented were not able to be obtained and vitals that have been documented are patient reported.  Patient Medicare AWV questionnaire was completed by the patient on 11/02/2023; I have confirmed that all information answered by patient is correct and no changes since this date.  Cardiac Risk Factors include: advanced age (>101men, >16 women);dyslipidemia;hypertension     Objective:    Today's Vitals   11/02/23 1005  Weight: 156 lb (70.8 kg)  Height: 5\' 4"  (1.626 m)   Body mass index is 26.78 kg/m.     11/02/2023   10:08 AM 04/02/2022    2:15 PM 10/07/2021    9:43 AM 11/30/2020    1:29 PM 10/05/2020    9:31 AM 08/22/2020    6:03 AM 08/16/2020    9:14 AM  Advanced Directives  Does Patient Have a Medical Advance Directive? No No No No No No No  Would patient like information on creating a medical advance directive? Yes (MAU/Ambulatory/Procedural Areas - Information given)  No - Patient declined No - Patient declined No - Patient declined No - Patient declined No - Patient declined    Current Medications (verified) Outpatient Encounter Medications as of 11/02/2023  Medication Sig   anastrozole (ARIMIDEX) 1 MG tablet TAKE 1 TABLET BY MOUTH EVERY DAY   Coenzyme Q10-Vitamin E (QUNOL ULTRA COQ10 PO) Take 1 capsule by mouth daily after lunch.    Krill Oil 500 MG  CAPS Take 500 mg by mouth daily after lunch.    levothyroxine (SYNTHROID) 75 MCG tablet TAKE 1 TABLET BY MOUTH DAILY BEFORE BREAKFAST.   lisinopril (ZESTRIL) 10 MG tablet Take 1 tablet (10 mg total) by mouth every evening.   lisinopril-hydrochlorothiazide (ZESTORETIC) 20-12.5 MG tablet TAKE 1 TABLET BY MOUTH EVERY DAY IN THE MORNING   Magnesium 250 MG TABS Take 250 mg by mouth daily after lunch.    memantine (NAMENDA) 5 MG tablet Take 1 tablet (5 mg total) by mouth 2 (two) times daily. For memory   Multiple Vitamin (MULTIVITAMIN WITH MINERALS) TABS tablet Take 1 tablet by mouth daily after lunch. Women's One A Day Multivitamin    simvastatin (ZOCOR) 20 MG tablet Take 1 tablet (20 mg total) by mouth daily.   No facility-administered encounter medications on file as of 11/02/2023.    Allergies (verified) Patient has no known allergies.   History: Past Medical History:  Diagnosis Date   Abnormal EKG saw dr hochrein 3 yrs ago for   hx of left bundle branch block on ekg since 2002   Breast cancer [facility])    Depression    Diverticulosis large intestine w/o perforation or abscess w/bleeding 2016   Family history of lung cancer    Family history of pancreatic cancer    Family history of prostate cancer    GERD (gastroesophageal reflux disease)    hx of   High cholesterol    History of hiatal hernia  Hx of colonic polyps 2016   adenomatous   Hypertension    Hypothyroidism    Pancreatic cyst    Thyroid disease    hypothyroidism   Past Surgical History:  Procedure Laterality Date   ABDOMINAL HYSTERECTOMY  2002   complete for plyps   BREAST LUMPECTOMY WITH RADIOACTIVE SEED LOCALIZATION Right 08/22/2020   Procedure: RIGHT BREAST LUMPECTOMY WITH RADIOACTIVE SEED LOCALIZATION;  Surgeon: Almond Lint, MD;  Location: [facility] OR;  Service: General;  Laterality: Right;   ESOPHAGOGASTRODUODENOSCOPY N/A 09/30/2018   Procedure: ESOPHAGOGASTRODUODENOSCOPY (EGD);  Surgeon: Rachael Fee, MD;   Location: [facility];  Service: Endoscopy;  Laterality: N/A;   EUS N/A 09/30/2018   Procedure: UPPER ENDOSCOPIC ULTRASOUND (EUS) RADIAL;  Surgeon: Rachael Fee, MD;  Location: [facility];  Service: Endoscopy;  Laterality: N/A;   LIVER BIOPSY     normal result   thryoid needle biopsy  1990's   THYROIDECTOMY  1982   Family History  Problem Relation Age of Onset   High Cholesterol Mother    Hypertension Mother    Goiter Mother    High Cholesterol Father    Hypertension Father    Hypertension Brother    Lung disease Brother        chronic smoker   Heart disease Brother    Goiter Sister    Heart disease Brother    Hypertension Brother    Prostate cancer Brother 18   Lung cancer Maternal Uncle        smoker   Pancreatic cancer Nephew        dx. 40s/50s   Cancer Nephew        pancreatic vs. multiple myeloma; dx. 40s/50s   Colon cancer Neg Hx    Stomach cancer Neg Hx    Social History   Socioeconomic History   Marital status: Single    Spouse name: Not on file   Number of children: 0   Years of education: 16   Highest education level: Bachelor's degree (e.g., BA, AB, BS)  Occupational History   Occupation: retired    Comment: Chief Strategy Officer  Tobacco Use   Smoking status: Never   Smokeless tobacco: Never  Vaping Use   Vaping status: Never Used  Substance and Sexual Activity   Alcohol use: Yes    Alcohol/week: 1.0 standard drink of alcohol    Types: 1 Glasses of wine per week    Comment: occasionally   Drug use: Never   Sexual activity: Not Currently    Partners: Male    Birth control/protection: Surgical  Other Topics Concern   Not on file  Social History Narrative   Lives alone.  No children   Her brother and sister live nearby   Social Determinants of Health   Financial Resource Strain: Low Risk  (11/02/2023)   Overall Financial Resource Strain (CARDIA)    Difficulty of Paying Living Expenses: Not hard at all  Food Insecurity: No Food Insecurity  (11/02/2023)   Hunger Vital Sign    Worried About Running Out of Food in the Last Year: Never true    Ran Out of Food in the Last Year: Never true  Transportation Needs: No Transportation Needs (11/02/2023)   PRAPARE - Administrator, Civil Service (Medical): No    Lack of Transportation (Non-Medical): No  Physical Activity: Sufficiently Active (11/02/2023)   Exercise Vital Sign    Days of Exercise per Week: 5 days    Minutes of Exercise per Session:  60 min  Stress: No Stress Concern Present (11/02/2023)   [employer] of Occupational Health - Occupational Stress Questionnaire    Feeling of Stress : Not at all  Social Connections: Moderately Isolated (11/02/2023)   Social Connection and Isolation Panel [NHANES]    Frequency of Communication with Friends and Family: More than three times a week    Frequency of Social Gatherings with Friends and Family: More than three times a week    Attends Religious Services: More than 4 times per year    Active Member of [employer] or Organizations: No    Attends Engineer, structural: Never    Marital Status: Never married    Tobacco Counseling Counseling given: Not Answered   Clinical Intake:  Pre-visit preparation completed: Yes  Pain : No/denies pain     Nutritional Risks: None Diabetes: No  How often do you need to have someone help you when you read instructions, pamphlets, or other written materials from your doctor or pharmacy?: 1 - Never  Interpreter Needed?: No  Information entered by :: Renie Ora, LPN   Activities of Daily Living    11/02/2023   10:08 AM  In your present state of health, do you have any difficulty performing the following activities:  Hearing? 0  Vision? 0  Difficulty concentrating or making decisions? 0  Walking or climbing stairs? 0  Dressing or bathing? 0  Doing errands, shopping? 0  Preparing Food and eating ? N  Using the Toilet? N  In the past six months, have you  accidently leaked urine? N  Do you have problems with loss of bowel control? N  Managing your Medications? N  Managing your Finances? N  Housekeeping or managing your Housekeeping? N    Patient Care Team: Raliegh Ip, DO as PCP - General (Family Medicine) Almond Lint, MD as Consulting Physician (General Surgery) Serena Croissant, MD as Consulting Physician (Hematology and Oncology) Antony Blackbird, MD as Consulting Physician (Radiation Oncology) Manning Charity, OD as Referring Physician (Optometry)  Indicate any recent Medical Services you may have received from other than [facility] providers in the past year (date may be approximate).     Assessment:   This is a routine wellness examination for Kelcey.  Hearing/Vision screen Vision Screening - Comments:: Wears rx glasses - up to date with routine eye exams with  Dr.johnson    Goals Addressed             This Visit's Progress    DIET - INCREASE WATER INTAKE   On track    Try to drink 6-8 glasses of water daily.       Depression Screen    11/02/2023   10:07 AM 06/15/2023   12:45 PM 03/13/2023   10:24 AM 01/14/2023    2:57 PM 10/13/2022   12:13 PM 05/12/2022   10:14 AM 03/31/2022   10:38 AM  PHQ 2/9 Scores  PHQ - 2 Score 0 0 0 0 0 0 0  PHQ- 9 Score  0 0   0 0    Fall Risk    11/02/2023   10:05 AM 06/15/2023   12:42 PM 03/13/2023   10:24 AM 01/14/2023    2:57 PM 10/13/2022   12:13 PM  Fall Risk   Falls in the past year? 0 0 0 0 0  Number falls in past yr: 0 0 0    Injury with Fall? 0 0 0    Risk for fall due to : No  Fall Risks No Fall Risks No Fall Risks    Follow up Falls prevention discussed Education provided Education provided      MEDICARE RISK AT HOME: Medicare Risk at Home Any stairs in or around the home?: No If so, are there any without handrails?: No Home free of loose throw rugs in walkways, pet beds, electrical cords, etc?: Yes Adequate lighting in your home to reduce risk of falls?: Yes Life alert?:  No Use of a cane, walker or w/c?: No Grab bars in the bathroom?: Yes Shower chair or bench in shower?: Yes Elevated toilet seat or a handicapped toilet?: Yes  TIMED UP AND GO:  Was the test performed?  No    Cognitive Function:    06/15/2023    1:01 PM 03/14/2023    8:37 AM 10/13/2022   12:14 PM 07/15/2018    8:16 AM  MMSE - Mini Mental State Exam  Orientation to time 2 1 4 5   Orientation to Place 5 5 5 5   Registration 2 2 3 3   Attention/ Calculation 5 3 3 5   Recall 2 3 0 3  Language- name 2 objects 2 2 2 2   Language- repeat 1 1 0 1  Language- follow 3 step command 2 3 3 3   Language- read & follow direction 1 1 1 1   Write a sentence 1 1 1 1   Copy design 1 1 1 1   Total score 24 23 23 30         11/02/2023   10:08 AM 10/05/2020    9:33 AM 10/05/2019    9:41 AM  6CIT Screen  What Year? 0 points 0 points 0 points  What month? 0 points  0 points  What time? 0 points 0 points 0 points  Count back from 20 0 points 0 points 0 points  Months in reverse 0 points 0 points 0 points  Repeat phrase 0 points 0 points 2 points  Total Score 0 points  2 points    Immunizations Immunization History  Administered Date(s) Administered   Fluad Quad(high Dose 65+) 08/27/2019, 08/27/2019, 09/08/2021, 10/13/2022   Influenza, High Dose Seasonal PF 09/07/2018   Influenza-Unspecified 09/26/2015, 09/17/2016, 09/25/2017, 09/01/2020   PFIZER(Purple Top)SARS-COV-2 Vaccination 01/20/2020, 02/10/2020, 08/28/2020, 03/30/2021   PNEUMOCOCCAL CONJUGATE-20 01/20/2022   Pfizer Covid-19 Vaccine Bivalent Booster 40yrs & up 09/11/2021   Pneumococcal Polysaccharide-23 09/25/2017   Zoster Recombinant(Shingrix) 06/10/2017, 09/03/2017    TDAP status: Due, Education has been provided regarding the importance of this vaccine. Advised may receive this vaccine at local pharmacy or Health Dept. Aware to provide a copy of the vaccination record if obtained from local pharmacy or Health Dept. Verbalized acceptance  and understanding.  Flu Vaccine status: Due, Education has been provided regarding the importance of this vaccine. Advised may receive this vaccine at local pharmacy or Health Dept. Aware to provide a copy of the vaccination record if obtained from local pharmacy or Health Dept. Verbalized acceptance and understanding.  Pneumococcal vaccine status: Up to date  Covid-19 vaccine status: Completed vaccines  Qualifies for Shingles Vaccine? Yes   Zostavax completed No   Shingrix Completed?: No.    Education has been provided regarding the importance of this vaccine. Patient has been advised to call insurance company to determine out of pocket expense if they have not yet received this vaccine. Advised may also receive vaccine at local pharmacy or Health Dept. Verbalized acceptance and understanding.  Screening Tests Health Maintenance  Topic Date Due   Colonoscopy  03/27/2022   INFLUENZA VACCINE  07/30/2023   COVID-19 Vaccine (6 - 2023-24 season) 08/30/2023   Medicare Annual Wellness (AWV)  11/01/2024   MAMMOGRAM  06/29/2025   DEXA SCAN  09/05/2025   Pneumonia Vaccine 29+ Years old  Completed   Hepatitis C Screening  Completed   Zoster Vaccines- Shingrix  Completed   HPV VACCINES  Aged Out   DTaP/Tdap/Td  Discontinued    Health Maintenance  Health Maintenance Due  Topic Date Due   Colonoscopy  03/27/2022   INFLUENZA VACCINE  07/30/2023   COVID-19 Vaccine (6 - 2023-24 season) 08/30/2023    Colorectal cancer screening: No longer required.   Mammogram status: No longer required due to age.  Bone Density status: Ordered 07/09/2023. Pt provided with contact info and advised to call to schedule appt.  Lung Cancer Screening: (Low Dose CT Chest recommended if Age 44-80 years, 20 pack-year currently smoking OR have quit w/in 15years.) does not qualify.   Lung Cancer Screening Referral: n/a  Additional Screening:  Hepatitis C Screening: does not qualify; Completed  09/01/2018  Vision Screening: Recommended annual ophthalmology exams for early detection of glaucoma and other disorders of the eye. Is the patient up to date with their annual eye exam?  Yes  Who is the provider or what is the name of the office in which the patient attends annual eye exams? Dr.johnson  If pt is not established with a provider, would they like to be referred to a provider to establish care? No .   Dental Screening: Recommended annual dental exams for proper oral hygiene   Community Resource Referral / Chronic Care Management: CRR required this visit?  No   CCM required this visit?  No     Plan:     I have personally reviewed and noted the following in the patient's chart:   Medical and social history Use of alcohol, tobacco or illicit drugs  Current medications and supplements including opioid prescriptions. Patient is not currently taking opioid prescriptions. Functional ability and status Nutritional status Physical activity Advanced directives List of other physicians Hospitalizations, surgeries, and ER visits in previous 12 months Vitals Screenings to include cognitive, depression, and falls Referrals and appointments  In addition, I have reviewed and discussed with patient certain preventive protocols, quality metrics, and best practice recommendations. A written personalized care plan for preventive services as well as general preventive health recommendations were provided to patient.     Lorrene Reid, LPN   [id number]   After Visit Summary: (MyChart) Due to this being a telephonic visit, the after visit summary with patients personalized plan was offered to patient via MyChart   Nurse Notes: none

## 2023-11-02 NOTE — Telephone Encounter (Signed)
Order placed for MRCP for pancreatic cyst.  Scheduled at [facility] on  Mon 11-25 at 4:00pm. Arr at 3:30pm NPO 4 hours. MyChart message and letter mailed to patient

## 2023-11-02 NOTE — Telephone Encounter (Signed)
-----   Message from [facility] Miles H sent at 11/27/2022  6:04 PM EST ----- Regarding: MRCP due MRCP due at end of Nov 2024 for cystic lesion of  the pancreas

## 2023-11-18 ENCOUNTER — Other Ambulatory Visit: Payer: Self-pay | Admitting: Gastroenterology

## 2023-11-18 ENCOUNTER — Ambulatory Visit (HOSPITAL_COMMUNITY)
Admission: RE | Admit: 2023-11-18 | Discharge: 2023-11-18 | Disposition: A | Discharge status: Home / Self Care | Payer: Medicare Other | Source: Ambulatory Visit | Attending: Gastroenterology | Admitting: Gastroenterology

## 2023-11-18 DIAGNOSIS — K862 Cyst of pancreas: Secondary | ICD-10-CM | POA: Insufficient documentation

## 2023-11-18 DIAGNOSIS — I700 Atherosclerosis of aorta: Secondary | ICD-10-CM | POA: Diagnosis not present

## 2023-11-18 MED ORDER — GADOBUTROL 1 MMOL/ML IV SOLN
7.0000 mL | Freq: Once | INTRAVENOUS | Status: AC | PRN
  Administered 2023-11-18: 7 mL via INTRAVENOUS

## 2023-11-23 ENCOUNTER — Ambulatory Visit (HOSPITAL_COMMUNITY): Payer: Medicare Other

## 2024-01-11 ENCOUNTER — Ambulatory Visit: Payer: Self-pay | Admitting: Family Medicine

## 2024-01-11 NOTE — Telephone Encounter (Signed)
  Chief Complaint: Confusion Symptoms: concerning memory issues, can't remember where she is. Can't remember to take medications.  Frequency: has been going on for year but has increased severely.  Pertinent Negatives: Patient denies CP, SOB Disposition: [] ED /[] Urgent Care (no appt availability in office) / [x] Appointment(In office/virtual)/ []  [facility]/ [] Home Care/ [] Refused Recommended Disposition /[] [facility]/ []  Follow-up with PCP Additional Notes: Christine Cantrell, who is on the DPR, called with concerns of continued and increased confusion and concerning memory issues. Confusion has been ongoing for over a year.  Christine is concerned that patient isn't remembering to take her medications correctly. Christine talks to patient every day with concerns that have been popping up-financial. Christine is concerned that patient shouldn't be living by herself anymore nor should she be driving. Brother Christine Cantrell was called and he stated that she has some confusion issues but drives fine. Christine Cantrell states he doesn't believe her confusion has increased. Her brother lives nears by and gave education on if patient were to need the ED setting. Christine Cantrell states he will be going to check on his sister later today. Patient has an appointment tomorrow with her PCP. Both Christine and Christine Cantrell verbalized understanding and all questions were answered.   Copied from CRM [id number]. Topic: Clinical - Red Word Triage >> Jan 11, 2024 12:44 PM Alfonso ORN wrote: Red Word that prompted transfer to Nurse Triage: Christine Cantrell is a friend  on dpr want to speak with provider Christine Cantrell or nurse regarding worsen conditions odd behavior  , patient suppose to be on 7 medication , having confusion not been trueful what's going , example confusing where she is at Reason for Disposition  [1] Longstanding confusion (e.g., dementia, stroke) AND [2] NO worsening or change  Answer Assessment - Initial  Assessment Questions 1. LEVEL OF CONSCIOUSNESS: How is he (she, the patient) acting right now? (e.g., alert-oriented, confused, lethargic, stuporous, comatose)     Confusion 2. ONSET: When did the confusion start?  (minutes, hours, days)     Has been going on for a year 3. PATTERN Does this come and go, or has it been constant since it started?  Is it present now?     Increasing with confusion 4. ALCOHOL or DRUGS: Has he been drinking alcohol or taking any drugs?      No 5. NARCOTIC MEDICINES: Has he been receiving any narcotic medications? (e.g., morphine, Vicodin)     No 6. CAUSE: What do you think is causing the confusion?      Has been going on for a year according to friend who is the [facility].  7. OTHER SYMPTOMS: Are there any other symptoms? (e.g., difficulty breathing, headache, fever, weakness)     No other symptoms  Protocols used: Confusion - Delirium-A-AH

## 2024-01-12 ENCOUNTER — Ambulatory Visit (INDEPENDENT_AMBULATORY_CARE_PROVIDER_SITE_OTHER): Payer: Medicare Other | Admitting: Family Medicine

## 2024-01-12 ENCOUNTER — Encounter: Payer: Self-pay | Admitting: Family Medicine

## 2024-01-12 VITALS — BP 144/71 | HR 100 | Temp 98.6°F | Ht 64.0 in | Wt 164.4 lb

## 2024-01-12 DIAGNOSIS — I10 Essential (primary) hypertension: Secondary | ICD-10-CM

## 2024-01-12 DIAGNOSIS — G3184 Mild cognitive impairment, so stated: Secondary | ICD-10-CM

## 2024-01-12 DIAGNOSIS — R7303 Prediabetes: Secondary | ICD-10-CM | POA: Diagnosis not present

## 2024-01-12 DIAGNOSIS — Z0001 Encounter for general adult medical examination with abnormal findings: Secondary | ICD-10-CM | POA: Diagnosis not present

## 2024-01-12 DIAGNOSIS — E782 Mixed hyperlipidemia: Secondary | ICD-10-CM | POA: Diagnosis not present

## 2024-01-12 DIAGNOSIS — E890 Postprocedural hypothyroidism: Secondary | ICD-10-CM

## 2024-01-12 DIAGNOSIS — G319 Degenerative disease of nervous system, unspecified: Secondary | ICD-10-CM

## 2024-01-12 DIAGNOSIS — Z0000 Encounter for general adult medical examination without abnormal findings: Secondary | ICD-10-CM

## 2024-01-12 LAB — BAYER DCA HB A1C WAIVED: HB A1C (BAYER DCA - WAIVED): 5.9 % — ABNORMAL HIGH (ref 4.8–5.6)

## 2024-01-12 MED ORDER — SIMVASTATIN 20 MG PO TABS: 20.0000 mg | ORAL_TABLET | Freq: Every day | ORAL | 3 refills | Status: AC

## 2024-01-12 MED ORDER — MEMANTINE HCL 5 MG PO TABS: 5.0000 mg | ORAL_TABLET | Freq: Two times a day (BID) | ORAL | 3 refills | Status: AC

## 2024-01-12 MED ORDER — LISINOPRIL-HYDROCHLOROTHIAZIDE 20-12.5 MG PO TABS: 1.0000 | ORAL_TABLET | Freq: Every day | ORAL | 3 refills | Status: AC

## 2024-01-12 MED ORDER — LEVOTHYROXINE SODIUM 75 MCG PO TABS: 75.0000 ug | ORAL_TABLET | Freq: Every day | ORAL | 3 refills | Status: AC

## 2024-01-12 NOTE — Progress Notes (Signed)
 Christine Cantrell is a 78 y.o. female presents to office today for annual physical exam examination.    Concerns today include: 1.  None.  She reports that she has been doing very well.  She has been compliant with all medications.  Continues to be independent in ADLs and IADLs.  No falls since 2 years ago.  She tries to remain physically active by walking around parks.  She has a primarily plant-based diet with occasional splurges on things like sausage biscuits.  However, most of her protein comes in the form of yogurt and other dairy products.  She drinks primarily water  and tea.  Her brother still checks in on her and lives down the road from her.  She reports her mood has been really good and that has been worrying about less stuff.  She denies any issues managing her finances, meals, self-care.  She denies any fecal or urinary incontinence.  Occupation: Retired, Marital status: Single, Substance use: Rare wine with supper when she goes out Health Maintenance Due  Topic Date Due   Colonoscopy  03/27/2022   INFLUENZA VACCINE  07/30/2023   COVID-19 Vaccine (6 - 2024-25 season) 08/30/2023   Refills needed today: All  Immunization History  Administered Date(s) Administered   Fluad Quad(high Dose 65+) 08/27/2019, 08/27/2019, 09/08/2021, 10/13/2022   Influenza, High Dose Seasonal PF 09/07/2018   Influenza-Unspecified 09/26/2015, 09/17/2016, 09/25/2017, 09/01/2020   PFIZER(Purple Top)SARS-COV-2 Vaccination 01/20/2020, 02/10/2020, 08/28/2020, 03/30/2021   PNEUMOCOCCAL CONJUGATE-20 01/20/2022   Pfizer Covid-19 Vaccine Bivalent Booster 55yrs & up 09/11/2021   Pneumococcal Polysaccharide-23 09/25/2017   Zoster Recombinant(Shingrix) 06/10/2017, 09/03/2017   Past Medical History:  Diagnosis Date   Abnormal EKG saw dr hochrein 3 yrs ago for   hx of left bundle branch block on ekg since 2002   Breast cancer (HCC)    Depression    Diverticulosis large intestine w/o perforation or abscess  w/bleeding 2016   Family history of lung cancer    Family history of pancreatic cancer    Family history of prostate cancer    GERD (gastroesophageal reflux disease)    hx of   High cholesterol    History of hiatal hernia    Hx of colonic polyps 2016   adenomatous   Hypertension    Hypothyroidism    Pancreatic cyst    Thyroid  disease    hypothyroidism   Social History   Socioeconomic History   Marital status: Single    Spouse name: Not on file   Number of children: 0   Years of education: 16   Highest education level: Bachelor's degree (e.g., BA, AB, BS)  Occupational History   Occupation: retired    Comment: chief strategy officer  Tobacco Use   Smoking status: Never   Smokeless tobacco: Never  Vaping Use   Vaping status: Never Used  Substance and Sexual Activity   Alcohol use: Yes    Alcohol/week: 1.0 standard drink of alcohol    Types: 1 Glasses of wine per week    Comment: occasionally   Drug use: Never   Sexual activity: Not Currently    Partners: Male    Birth control/protection: Surgical  Other Topics Concern   Not on file  Social History Narrative   Lives alone.  No children   Her brother and sister live nearby   Social Drivers of Health   Financial Resource Strain: Low Risk  (11/02/2023)   Overall Financial Resource Strain (CARDIA)    Difficulty of Paying Living Expenses:  Not hard at all  Food Insecurity: No Food Insecurity (11/02/2023)   Hunger Vital Sign    Worried About Running Out of Food in the Last Year: Never true    Ran Out of Food in the Last Year: Never true  Transportation Needs: No Transportation Needs (11/02/2023)   PRAPARE - Administrator, Civil Service (Medical): No    Lack of Transportation (Non-Medical): No  Physical Activity: Sufficiently Active (11/02/2023)   Exercise Vital Sign    Days of Exercise per Week: 5 days    Minutes of Exercise per Session: 60 min  Stress: No Stress Concern Present (11/02/2023)   [employer] - Occupational Stress Questionnaire    Feeling of Stress : Not at all  Social Connections: Moderately Isolated (11/02/2023)   Social Connection and Isolation Panel [NHANES]    Frequency of Communication with Friends and Family: More than three times a week    Frequency of Social Gatherings with Friends and Family: More than three times a week    Attends Religious Services: More than 4 times per year    Active Member of [employer] or Organizations: No    Attends Banker Meetings: Never    Marital Status: Never married  Intimate Partner Violence: Not At Risk (11/02/2023)   Humiliation, Afraid, Rape, and Kick questionnaire    Fear of Current or Ex-Partner: No    Emotionally Abused: No    Physically Abused: No    Sexually Abused: No   Past Surgical History:  Procedure Laterality Date   ABDOMINAL HYSTERECTOMY  2002   complete for plyps   BREAST LUMPECTOMY WITH RADIOACTIVE SEED LOCALIZATION Right 08/22/2020   Procedure: RIGHT BREAST LUMPECTOMY WITH RADIOACTIVE SEED LOCALIZATION;  Surgeon: Aron Shoulders, MD;  Location: [facility];  Service: General;  Laterality: Right;   ESOPHAGOGASTRODUODENOSCOPY N/A 09/30/2018   Procedure: ESOPHAGOGASTRODUODENOSCOPY (EGD);  Surgeon: Teressa Toribio SQUIBB, MD;  Location: [facility];  Service: Endoscopy;  Laterality: N/A;   EUS N/A 09/30/2018   Procedure: UPPER ENDOSCOPIC ULTRASOUND (EUS) RADIAL;  Surgeon: Teressa Toribio SQUIBB, MD;  Location: [facility];  Service: Endoscopy;  Laterality: N/A;   LIVER BIOPSY     normal result   thryoid needle biopsy  1990's   THYROIDECTOMY  1982   Family History  Problem Relation Age of Onset   High Cholesterol Mother    Hypertension Mother    Goiter Mother    High Cholesterol Father    Hypertension Father    Hypertension Brother    Lung disease Brother        chronic smoker   Heart disease Brother    Goiter Sister    Heart disease Brother    Hypertension Brother    Prostate cancer  Brother 72   Lung cancer Maternal Uncle        smoker   Pancreatic cancer Nephew        dx. 40s/50s   Cancer Nephew        pancreatic vs. multiple myeloma; dx. 40s/50s   Colon cancer Neg Hx    Stomach cancer Neg Hx     Current Outpatient Medications:    anastrozole  (ARIMIDEX ) 1 MG tablet, TAKE 1 TABLET BY MOUTH EVERY DAY, Disp: 90 tablet, Rfl: 3   Coenzyme Q10-Vitamin E  (QUNOL ULTRA COQ10 PO), Take 1 capsule by mouth daily after lunch. , Disp: , Rfl:    Krill Oil 500 MG CAPS, Take 500 mg by mouth daily after lunch. ,  Disp: , Rfl:    levothyroxine  (SYNTHROID ) 75 MCG tablet, TAKE 1 TABLET BY MOUTH DAILY BEFORE BREAKFAST., Disp: 90 tablet, Rfl: 2   lisinopril  (ZESTRIL ) 10 MG tablet, Take 1 tablet (10 mg total) by mouth every evening., Disp: 90 tablet, Rfl: 3   lisinopril -hydrochlorothiazide  (ZESTORETIC ) 20-12.5 MG tablet, TAKE 1 TABLET BY MOUTH EVERY DAY IN THE MORNING, Disp: 90 tablet, Rfl: 3   Magnesium  250 MG TABS, Take 250 mg by mouth daily after lunch. , Disp: , Rfl:    memantine  (NAMENDA ) 5 MG tablet, Take 1 tablet (5 mg total) by mouth 2 (two) times daily. For memory, Disp: 180 tablet, Rfl: 3   Multiple Vitamin (MULTIVITAMIN WITH MINERALS) TABS tablet, Take 1 tablet by mouth daily after lunch. Women's One A Day Multivitamin , Disp: , Rfl:    simvastatin  (ZOCOR ) 20 MG tablet, Take 1 tablet (20 mg total) by mouth daily., Disp: 90 tablet, Rfl: 3  No Known Allergies   ROS: Review of Systems Pertinent items noted in HPI and remainder of comprehensive ROS otherwise negative.    Physical exam BP (!) 144/71   Pulse 100   Temp 98.6 F (37 C)   Ht 5' 4 (1.626 m)   Wt 164 lb 6.4 oz (74.6 kg)   SpO2 94%   BMI 28.22 kg/m  General appearance: alert, cooperative, appears stated age, and no distress Head: Normocephalic, without obvious abnormality, atraumatic Eyes: negative findings: lids and lashes normal, conjunctivae and sclerae normal, corneas clear, and pupils equal, round,  reactive to light and accomodation Ears: normal TM's and external ear canals both ears Nose: Nares normal. Septum midline. Mucosa normal. No drainage or sinus tenderness. Throat: lips, mucosa, and tongue normal; teeth and gums normal Neck: no adenopathy, supple, symmetrical, trachea midline, and thyroid  not enlarged, symmetric, no tenderness/mass/nodules Back: symmetric, no curvature. ROM normal. No CVA tenderness. Lungs: clear to auscultation bilaterally Heart: regular rate and rhythm, S1, S2 normal, no murmur, click, rub or gallop Abdomen: soft, non-tender; bowel sounds normal; no masses,  no organomegaly Extremities: extremities normal, atraumatic, no cyanosis or edema Pulses: 2+ and symmetric Skin:  Several senile purpura and solar lentigo noted throughout the upper extremities and lower extremities Lymph nodes: Cervical, supraclavicular, and axillary nodes normal. Neurologic: MMSE as below.  She is very pleasant, conversive and responds to questioning appropriately. Psych: Mood is stable.  She does appeared to be responding to internal stimuli     01/12/2024   10:53 AM 06/15/2023    1:01 PM 03/14/2023    8:37 AM  MMSE - Mini Mental State Exam  Orientation to time 0 2 1  Orientation to Place 5 5 5   Registration 3 2 2   Attention/ Calculation 2 5 3   Recall 3 2 3   Language- name 2 objects 2 2 2   Language- repeat 1 1 1   Language- follow 3 step command 2 2 3   Language- read & follow direction 1 1 1   Write a sentence 1 1 1   Copy design 1 1 1   Total score 21 24 23        01/12/2024   10:53 AM 11/02/2023   10:07 AM 06/15/2023   12:45 PM  Depression screen PHQ 2/9  Decreased Interest 0 0 0  Down, Depressed, Hopeless 0 0 0  PHQ - 2 Score 0 0 0  Altered sleeping 0  0  Tired, decreased energy 0  0  Change in appetite 0  0  Feeling bad or failure about yourself  0  0  Trouble concentrating 0  0  Moving slowly or fidgety/restless 0  0  Suicidal thoughts 0  0  PHQ-9 Score 0  0   Difficult doing work/chores Not difficult at all  Not difficult at all      01/12/2024   10:53 AM 06/15/2023   12:46 PM 03/13/2023   10:24 AM 01/14/2023    2:57 PM  GAD 7 : Generalized Anxiety Score  Nervous, Anxious, on Edge 0 0 0 0  Control/stop worrying 0 0 0 0  Worry too much - different things 0 0 0 0  Trouble relaxing 0 0 0 0  Restless 0 0 0 0  Easily annoyed or irritable 0 0 0 0  Afraid - awful might happen 0 0 0 0  Total GAD 7 Score 0 0 0 0  Anxiety Difficulty  Not difficult at all Not difficult at all Not difficult at all     Assessment/ Plan: Christine Cantrell here for annual physical exam.   Annual physical exam  MCI (mild cognitive impairment) with memory loss - Plan: CMP14+EGFR, CBC, memantine  (NAMENDA ) 5 MG tablet, Ambulatory referral to Neurology  Cerebral atrophy (HCC)  Essential hypertension - Plan: CMP14+EGFR, lisinopril -hydrochlorothiazide  (ZESTORETIC ) 20-12.5 MG tablet  Mixed hyperlipidemia - Plan: CMP14+EGFR, Lipid Panel, simvastatin  (ZOCOR ) 20 MG tablet  Postoperative hypothyroidism - Plan: TSH + free T4  Prediabetes - Plan: Bayer DCA Hb A1c Waived  Nonfasting labs were collected.  Her MMSE, while still falling in MCI range, has declined from 24-21 today.  She has been compliant with Namenda .  I did discuss with her strong consideration for seeing neurology at this point given borderline level.  She was amenable to this and I have placed a referral for her.  Will check renal function, liver enzymes, sugar, thyroid  levels, lipid panel  She will continue all medications as prescribed and refills have been sent to appropriate pharmacy.  Counseled on healthy lifestyle choices, including diet (rich in fruits, vegetables and lean meats and low in salt and simple carbohydrates) and exercise (at least 30 minutes of moderate physical activity daily).  Patient to follow up 29m  Christine Cantrell M. Jolinda, DO

## 2024-01-12 NOTE — Patient Instructions (Signed)
 Your memory test is a little worse than last year.  Technically still in "Mild" range but since it has dropped, we talked about seeing a neurologist for additional support.

## 2024-01-13 ENCOUNTER — Telehealth: Payer: Self-pay

## 2024-01-13 NOTE — Telephone Encounter (Signed)
 Spoke to patient's friend Soyla Duverney.  She notes ongoing concerns about the patient's state of health.  She reports that despite her reports of healthful eating at home she in fact is not eating even regularly.  She has been mismanaging finances and got final notices on her taxes last year after forgetting to pay it despite multiple reminders.  She often seems confused in the evening time.  She also worries about use of alcohol.  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  The patient has not admitted to utilizing alcohol but she often has slurred speech in the evening when her friend talks to her.  I informed her of upcoming neurologic evaluation given decline in MMSE.  However, after further discussion the patient did not even remember seeing me yesterday nor having blood work.

## 2024-01-13 NOTE — Telephone Encounter (Signed)
 Dr Bonnell Butcher called and spoke with charlotte

## 2024-01-13 NOTE — Telephone Encounter (Signed)
   Copied from CRM [id number]. Topic: Clinical - Medical Advice >> Jan 13, 2024  4:15 PM Ivette P wrote: Reason for CRM: Soyla Duverney called in pertaining to Pt not being responsive and not knowing what happened during her visit. Soyla Duverney is asking for a phone call to understand what pt needs to do, if anything different.   Requesting callback at [phone]

## 2024-01-15 LAB — CBC
Hematocrit: 46.5 % (ref 34.0–46.6)
Hemoglobin: 16 g/dL — ABNORMAL HIGH (ref 11.1–15.9)
MCH: 33.5 pg — ABNORMAL HIGH (ref 26.6–33.0)
MCHC: 34.4 g/dL (ref 31.5–35.7)
MCV: 97 fL (ref 79–97)
Platelets: 175 10*3/uL (ref 150–450)
RBC: 4.78 x10E6/uL (ref 3.77–5.28)
RDW: 12.5 % (ref 11.7–15.4)
WBC: 5.4 10*3/uL (ref 3.4–10.8)

## 2024-01-15 LAB — TSH+FREE T4
Free T4: 1.03 ng/dL (ref 0.82–1.77)
TSH: 1.65 u[IU]/mL (ref 0.450–4.500)

## 2024-01-15 LAB — CMP14+EGFR
ALT: 20 [IU]/L (ref 0–32)
AST: 24 [IU]/L (ref 0–40)
Albumin: 4.5 g/dL (ref 3.8–4.8)
Alkaline Phosphatase: 44 [IU]/L (ref 44–121)
BUN/Creatinine Ratio: 16 (ref 12–28)
BUN: 13 mg/dL (ref 8–27)
Bilirubin Total: 0.7 mg/dL (ref 0.0–1.2)
CO2: 22 mmol/L (ref 20–29)
Calcium: 9.6 mg/dL (ref 8.7–10.3)
Chloride: 97 mmol/L (ref 96–106)
Creatinine, Ser: 0.82 mg/dL (ref 0.57–1.00)
Globulin, Total: 2.6 g/dL (ref 1.5–4.5)
Glucose: 185 mg/dL — ABNORMAL HIGH (ref 70–99)
Potassium: 4.2 mmol/L (ref 3.5–5.2)
Sodium: 140 mmol/L (ref 134–144)
Total Protein: 7.1 g/dL (ref 6.0–8.5)
eGFR: 73 mL/min/{1.73_m2} (ref >59)

## 2024-01-15 LAB — LIPID PANEL
Chol/HDL Ratio: 2.5 {ratio} (ref 0.0–4.4)
Cholesterol, Total: 261 mg/dL — ABNORMAL HIGH (ref 100–199)
HDL: 105 mg/dL (ref >39)
LDL Chol Calc (NIH): 136 mg/dL — ABNORMAL HIGH (ref 0–99)
Triglycerides: 122 mg/dL (ref 0–149)
VLDL Cholesterol Cal: 20 mg/dL (ref 5–40)

## 2024-01-18 ENCOUNTER — Ambulatory Visit: Payer: Self-pay | Admitting: Family Medicine

## 2024-01-18 DIAGNOSIS — I10 Essential (primary) hypertension: Secondary | ICD-10-CM

## 2024-01-18 NOTE — Telephone Encounter (Signed)
Please call Ms Christine Cantrell and review meds. She is NOT on aricept. She is on Namenda.  Her refills are already at the pharmacy, as I made sure they had fills when I saw her at her CPE. I have already spoken to Ms Christine Cantrell at length about mental concerns and referral.  I would recommend she contact [name]' brother to make sure she goes to appointment, as this is outside of my control.

## 2024-01-18 NOTE — Telephone Encounter (Signed)
Attempted to call mail box is full.

## 2024-01-18 NOTE — Telephone Encounter (Signed)
This RN returned the patient's call regarding medication questions. Patient stated that she found someone else to help her and that she did not need additional help at this time.   Copied from CRM [id number]. Topic: Clinical - Prescription Issue >> Jan 18, 2024 12:45 PM Ivette P wrote: Reason for CRM: Pt is confused about medications and would like someone to call her and explain her medications. Shoshanna [phone] Reason for Disposition  General information question, no triage required and triager able to answer question  Protocols used: Information Only Call - No Triage-A-AH

## 2024-01-18 NOTE — Telephone Encounter (Signed)
Chief Complaint: Heavy concerns regarding patient Symptoms: memory management, medication management Disposition: [] ED /[] Urgent Care (no appt availability in office) / [] Appointment(In office/virtual)/ []  [facility]/ [] Home Care/ [] Refused Recommended Disposition /[] [facility]/ [x]  Follow-up with PCP Additional Notes: Patient's friend Gibson Ramp called in with very heavy concern about patient's memory and ability to manage medications/referral. Christine Cantrell states patient spoke with her today and patient was unsure on what medications she should be taking and doesn't know what dosage to take. Christine Cantrell knows patient is completely out of Lisinopril (submitted refill request). Christine Cantrell is also concerned that patient is supposed to also be on Aricept but this is not showing on Medication Management list. Christine Cantrell states patient is so disoriented she has been doing things out of character (driving places and unsure of where she is, etc). Requesting Dr. Nadine Counts to please call Christine Cantrell asap at landline - [phone] to review exact medication list and referral information for Neurology as she wants to ensure patient goes to appointment. Christine Cantrell is very heavily concerned about patient's ability to manage day to day tasks and to have full orientation to present reality to ensure she is safe.         Reason for Disposition  [1] Caller is not with the adult (patient) AND [2] probable NON-URGENT symptoms  Answer Assessment - Initial Assessment Questions 1. REASON FOR CALL or QUESTION: "What is your reason for calling today?" or "How can I best help you?" or "What question do you have that I can help answer?"     Friend Christine Cantrell has very serious concerns and would like to discuss with Dr. Nadine Counts medication management and referral for Neuro.  Protocols used: Information Only Call - No Triage-A-AH

## 2024-02-08 ENCOUNTER — Telehealth: Payer: Self-pay | Admitting: Family Medicine

## 2024-02-08 NOTE — Telephone Encounter (Signed)
Copied from CRM [id number]. Topic: Referral - Status >> Feb 08, 2024  1:57 PM Geroge Baseman wrote: Reason for CRM: Patient calling to check on status of referral to neurology. I gave her the number to the location and transferred her over. She said she was wanting it to be at a location is [place] but she doesn't think it went to the right place. Please follow up with patient.

## 2024-02-08 NOTE — Telephone Encounter (Signed)
 This patient has dementia. Please call her brother for appointment. His number is on file.

## 2024-02-08 NOTE — Telephone Encounter (Signed)
 I called brother, he was unaware of who called but I made sure he understood neurology referral was placed and will be in [place]. Will speak with her tomorrow.

## 2024-02-16 ENCOUNTER — Encounter: Payer: Self-pay | Admitting: Gastroenterology

## 2024-02-29 ENCOUNTER — Ambulatory Visit (INDEPENDENT_AMBULATORY_CARE_PROVIDER_SITE_OTHER): Payer: Medicare Other | Admitting: Family Medicine

## 2024-02-29 VITALS — BP 181/85 | HR 78 | Temp 97.9°F | Ht 64.0 in | Wt 161.8 lb

## 2024-02-29 DIAGNOSIS — I10 Essential (primary) hypertension: Secondary | ICD-10-CM | POA: Diagnosis not present

## 2024-02-29 DIAGNOSIS — G3184 Mild cognitive impairment, so stated: Secondary | ICD-10-CM

## 2024-02-29 DIAGNOSIS — G319 Degenerative disease of nervous system, unspecified: Secondary | ICD-10-CM

## 2024-02-29 NOTE — Progress Notes (Signed)
 Subjective: CC:" Med check" PCP: Raliegh Ip, DO ZOX:WRUEA Glace is a 78 y.o. female presenting to clinic today for:  1.  Hypertension Patient reports compliance with antihypertensives.  Reports no vision changes, chest pain, shortness of breath, edema or falls.  2.  MCI She reports that she did not ever get a phone call that she recalls for an appointment for neurology.  She reports no concerning symptoms or signs.  She is compliant with Namenda.  According to the telephone encounters, her brother was contacted and they were made aware that there was a neuro referral in place and they were supposed to speak with her about this.  This was on February 08, 2024.  However, no apparent rescheduling made after many attempts to reach patient.  She tells me today however she is amenable to this appointment.   ROS: Per HPI  No Known Allergies Past Medical History:  Diagnosis Date   Abnormal EKG saw dr hochrein 3 yrs ago for   hx of left bundle branch block on ekg since 2002   Breast cancer [facility])    Depression    Diverticulosis large intestine w/o perforation or abscess w/bleeding 2016   Family history of lung cancer    Family history of pancreatic cancer    Family history of prostate cancer    GERD (gastroesophageal reflux disease)    hx of   High cholesterol    History of hiatal hernia    Hx of colonic polyps 2016   adenomatous   Hypertension    Hypothyroidism    Pancreatic cyst    Thyroid disease    hypothyroidism    Current Outpatient Medications:    anastrozole (ARIMIDEX) 1 MG tablet, TAKE 1 TABLET BY MOUTH EVERY DAY, Disp: 90 tablet, Rfl: 3   Coenzyme Q10-Vitamin E (QUNOL ULTRA COQ10 PO), Take 1 capsule by mouth daily after lunch. , Disp: , Rfl:    Krill Oil 500 MG CAPS, Take 500 mg by mouth daily after lunch. , Disp: , Rfl:    levothyroxine (SYNTHROID) 75 MCG tablet, Take 1 tablet (75 mcg total) by mouth daily before breakfast., Disp: 90 tablet, Rfl: 3    lisinopril-hydrochlorothiazide (ZESTORETIC) 20-12.5 MG tablet, Take 1 tablet by mouth daily., Disp: 90 tablet, Rfl: 3   Magnesium 250 MG TABS, Take 250 mg by mouth daily after lunch. , Disp: , Rfl:    memantine (NAMENDA) 5 MG tablet, Take 1 tablet (5 mg total) by mouth 2 (two) times daily. For memory, Disp: 180 tablet, Rfl: 3   Multiple Vitamin (MULTIVITAMIN WITH MINERALS) TABS tablet, Take 1 tablet by mouth daily after lunch. Women's One A Day Multivitamin , Disp: , Rfl:    simvastatin (ZOCOR) 20 MG tablet, Take 1 tablet (20 mg total) by mouth daily., Disp: 90 tablet, Rfl: 3 Social History   Socioeconomic History   Marital status: Single    Spouse name: Not on file   Number of children: 0   Years of education: 16   Highest education level: Bachelor's degree (e.g., BA, AB, BS)  Occupational History   Occupation: retired    Comment: Chief Strategy Officer  Tobacco Use   Smoking status: Never   Smokeless tobacco: Never  Vaping Use   Vaping status: Never Used  Substance and Sexual Activity   Alcohol use: Yes    Alcohol/week: 1.0 standard drink of alcohol    Types: 1 Glasses of wine per week    Comment: occasionally   Drug use:  Never   Sexual activity: Not Currently    Partners: Male    Birth control/protection: Surgical  Other Topics Concern   Not on file  Social History Narrative   Lives alone.  No children   Her brother and sister live nearby   Social Drivers of Health   Financial Resource Strain: Low Risk  (02/29/2024)   Overall Financial Resource Strain (CARDIA)    Difficulty of Paying Living Expenses: Not hard at all  Food Insecurity: No Food Insecurity (02/29/2024)   Hunger Vital Sign    Worried About Running Out of Food in the Last Year: Never true    Ran Out of Food in the Last Year: Never true  Transportation Needs: No Transportation Needs (02/29/2024)   PRAPARE - Administrator, Civil Service (Medical): No    Lack of Transportation (Non-Medical): No  Physical  Activity: Sufficiently Active (02/29/2024)   Exercise Vital Sign    Days of Exercise per Week: 5 days    Minutes of Exercise per Session: 60 min  Stress: No Stress Concern Present (02/29/2024)   [employer] of Occupational Health - Occupational Stress Questionnaire    Feeling of Stress : Only a little  Social Connections: Moderately Integrated (02/29/2024)   Social Connection and Isolation Panel [NHANES]    Frequency of Communication with Friends and Family: More than three times a week    Frequency of Social Gatherings with Friends and Family: Three times a week    Attends Religious Services: More than 4 times per year    Active Member of Clubs or Organizations: Yes    Attends Banker Meetings: Never    Marital Status: Never married  Intimate Partner Violence: Not At Risk (11/02/2023)   Humiliation, Afraid, Rape, and Kick questionnaire    Fear of Current or Ex-Partner: No    Emotionally Abused: No    Physically Abused: No    Sexually Abused: No   Family History  Problem Relation Age of Onset   High Cholesterol Mother    Hypertension Mother    Goiter Mother    High Cholesterol Father    Hypertension Father    Hypertension Brother    Lung disease Brother        chronic smoker   Heart disease Brother    Goiter Sister    Heart disease Brother    Hypertension Brother    Prostate cancer Brother 72   Lung cancer Maternal Uncle        smoker   Pancreatic cancer Nephew        dx. 40s/50s   Cancer Nephew        pancreatic vs. multiple myeloma; dx. 40s/50s   Colon cancer Neg Hx    Stomach cancer Neg Hx     Objective: Office vital signs reviewed. BP (!) 181/85   Pulse 78   Temp 97.9 F (36.6 C)   Ht 5\' 4"  (1.626 m)   Wt 161 lb 12.8 oz (73.4 kg)   SpO2 96%   BMI 27.77 kg/m   Physical Examination:  General: Awake, alert, nontoxic-appearing female, No acute distress HEENT: sclera white, MMM Cardio: regular rate with occasional extra beat, S1S2 heard, no  murmurs appreciated Pulm: clear to auscultation bilaterally, no wheezes, rhonchi or rales; normal work of breathing on room air Psych:' Does not appear to be responding to internal stimuli Neuro: Converses appropriately with provider but clearly does not recall having been seen in January  Assessment/ Plan: 78  y.o. female   MCI (mild cognitive impairment) with memory loss  Cerebral atrophy (HCC)  Primary hypertension  I do fear that she has more than what our MMSE scoring is showing.  She routinely has not recalled our visits, lab draws or vaccination appointments.  Additionally, the telephone note suggest that she had no recollection of referral but today when speaking with her she is very amenable and open to having this referral.  I did give her a printed copy of the referral with information on how to contact that office for an appointment.  I do think that she is going to need additional assistance at home and hopefully her brother would be willing to step into provide this.  I have talked to her friend that lives in the [place], [place], at length in the past but sadly she is not able to come and help the patient more than she does from a distance.   Her blood pressure is not at goal today.  She reports having taken the blood pressure medicine however, it is drastically higher than it was when we saw each other in January.  She will return in a couple of weeks to have blood pressure repeated with nurse  Raliegh Ip, DO [facility] [phone]

## 2024-03-14 ENCOUNTER — Ambulatory Visit

## 2024-03-14 NOTE — Progress Notes (Signed)
 Pt came in today for b/p check. Pt denies any symptoms  B/p - 131/68  - 142/67 Hr 98 O2 96

## 2024-03-24 DIAGNOSIS — L821 Other seborrheic keratosis: Secondary | ICD-10-CM | POA: Diagnosis not present

## 2024-03-24 DIAGNOSIS — X32XXXA Exposure to sunlight, initial encounter: Secondary | ICD-10-CM | POA: Diagnosis not present

## 2024-03-24 DIAGNOSIS — D225 Melanocytic nevi of trunk: Secondary | ICD-10-CM | POA: Diagnosis not present

## 2024-03-24 DIAGNOSIS — L814 Other melanin hyperpigmentation: Secondary | ICD-10-CM | POA: Diagnosis not present

## 2024-03-24 DIAGNOSIS — L570 Actinic keratosis: Secondary | ICD-10-CM | POA: Diagnosis not present

## 2024-03-24 DIAGNOSIS — L538 Other specified erythematous conditions: Secondary | ICD-10-CM | POA: Diagnosis not present

## 2024-03-24 DIAGNOSIS — L820 Inflamed seborrheic keratosis: Secondary | ICD-10-CM | POA: Diagnosis not present

## 2024-06-16 ENCOUNTER — Telehealth: Payer: Self-pay | Admitting: Family Medicine

## 2024-06-17 ENCOUNTER — Telehealth: Payer: Self-pay

## 2024-06-17 NOTE — Telephone Encounter (Signed)
 Copied from CRM [id number]. Topic: Appointments - Appointment Info/Confirmation >> Jun 17, 2024  9:27 AM Christine Cantrell wrote: Patient/patient representative is calling for information regarding an appointment. >> Jun 17, 2024 12:48 PM Christine Cantrell wrote: Patient's friend and DPR Christine Cantrell called to provide a wellness update regarding Christine Cantrell.  Christine Cantrell advised that patient gets all cleaned up for her appointment and then performs wellness at appointments. Patient made Christine Cantrell a [place] but will not give her access to After Visit Forms.   Sleeping with a loaded gun under her pillow. She [place] Patient cannot swim. Suicidal ideation. Dark moods. Paranoia. No object permanence.   Friend is incredibly concerned about patient's moment to moment safety. She's driving and getting tickets. She has no familial support that isn't also suffering for the same issues. Patient is not taking her dementia medications.   Patient has been in steady decline since last year.   At patient's appointment tomorrow, please ask about these things. Please call Christine Cantrell back.

## 2024-06-17 NOTE — Telephone Encounter (Signed)
 Christine Cantrell called the police department to have them do a well fair check on patient.     Returned call to [place] who lives in [place] - she is on [place] to speak with.  Soyla Duverney wanted to call and give Dr. Crissie Dome a report for patient's appointment on Monday.  When showing a lot of concern due to the previous message about patient having suicidal ideations  and sleeping with a loaded gun under her pillow Soyla Duverney stated this was not emergent and states patient is always like this.  Explained to her the measures we had to take with doing a welfare check for patients safety and wellbeing.  States that was fine but the patient would either not go to the door or act like she was fine when the police came.  States that she is not taking any of her medications.  Is not able to work her tv, work her alarm and use her cell phone at times.  States patient called her last night and told her she got a speeding ticket.  States she will just drive around.  Also wanted you to know that her depression has gotten worse.  Soyla Duverney is not sure what she can do since patient has no POA or living will.  No children.  Living family Is a sister who has dementia and a brother who sweeps everything under the rug.  What steps do we need to take since patient should not be living on her own?  Adult protective services ?  States that patient is not willing to go to a home but it is in her best interest and safety for patient.  Soyla Duverney is available Monday morning before patients appointment.

## 2024-06-20 ENCOUNTER — Ambulatory Visit (INDEPENDENT_AMBULATORY_CARE_PROVIDER_SITE_OTHER): Admitting: Family Medicine

## 2024-06-20 ENCOUNTER — Encounter: Payer: Self-pay | Admitting: Family Medicine

## 2024-06-20 VITALS — BP 161/85 | HR 82 | Temp 98.3°F | Ht 64.0 in | Wt 157.0 lb

## 2024-06-20 DIAGNOSIS — R1013 Epigastric pain: Secondary | ICD-10-CM | POA: Diagnosis not present

## 2024-06-20 DIAGNOSIS — E890 Postprocedural hypothyroidism: Secondary | ICD-10-CM | POA: Diagnosis not present

## 2024-06-20 DIAGNOSIS — G3184 Mild cognitive impairment, so stated: Secondary | ICD-10-CM | POA: Diagnosis not present

## 2024-06-20 DIAGNOSIS — R739 Hyperglycemia, unspecified: Secondary | ICD-10-CM | POA: Diagnosis not present

## 2024-06-20 LAB — BAYER DCA HB A1C WAIVED: HB A1C (BAYER DCA - WAIVED): 5.8 % — ABNORMAL HIGH (ref 4.8–5.6)

## 2024-06-20 NOTE — Telephone Encounter (Signed)
 Attempted to call Roselie x2 (goes directly to a full voicemail), sadly could not call before pt's 815am appt today.  I have spoken to [place] at [facility] Adult protective services, as I have observed a palpable decline in patient's cognitive ability/ judgement.  She has come into the office at least 2 more times after our office visit today with no recollection of our discussion.  I'm quite concerned about her driving with this very obvious short term memory loss and it causes concern for self neglect and mismanagement of medications.  Since I believe that it is possible she is a danger to herself, I think she at least needs an evaluation with APS.  Eva will contact me with updates on her case but if Ms Mariel calls back, please inform her that I have already reached out to APS and that they would likely be contacting her soon as well for additional information.

## 2024-06-20 NOTE — Progress Notes (Signed)
 Subjective: RR:dunfjry pain PCP: Jolinda Norene HERO, DO YEP:Christine Cantrell is a 78 y.o. female presenting to clinic today for:  1. Stomach pain She reports this actually was very short-lived and resolved.  She has not had any recurrent abdominal pain, nausea, vomiting, blood in stool, changes in bowel habits.  She reports eating plenty of fruits and vegetables and drinking enough water  2.  Memory changes She still has not seen the neurologist and does not remember why she has not reached out to them yet.  Her friend Roselie, who contacted us  separately, continues to have quite a bit of concerns about her decline and is worried that she is not taking her medications nor taking care of herself.  The patient admits that she does have isolating behaviors and spends a lot of time by herself.  She tries to stay physically active by going on walks.  She continues to engage with her own sister by going out to breakfast occasionally.  She is amenable to seeing a neurologist and would like to get this information again so that she can get this set up.   ROS: Per HPI  No Known Allergies Past Medical History:  Diagnosis Date   Abnormal EKG saw dr hochrein 3 yrs ago for   hx of left bundle branch block on ekg since 2002   Breast cancer [facility])    Depression    Diverticulosis large intestine w/o perforation or abscess w/bleeding 2016   Family history of lung cancer    Family history of pancreatic cancer    Family history of prostate cancer    GERD (gastroesophageal reflux disease)    hx of   High cholesterol    History of hiatal hernia    Hx of colonic polyps 2016   adenomatous   Hypertension    Hypothyroidism    Pancreatic cyst    Thyroid  disease    hypothyroidism    Current Outpatient Medications:    anastrozole  (ARIMIDEX ) 1 MG tablet, TAKE 1 TABLET BY MOUTH EVERY DAY, Disp: 90 tablet, Rfl: 3   Coenzyme Q10-Vitamin E (QUNOL ULTRA COQ10 PO), Take 1 capsule by mouth daily after lunch.  , Disp: , Rfl:    Krill Oil 500 MG CAPS, Take 500 mg by mouth daily after lunch. , Disp: , Rfl:    levothyroxine  (SYNTHROID ) 75 MCG tablet, Take 1 tablet (75 mcg total) by mouth daily before breakfast., Disp: 90 tablet, Rfl: 3   lisinopril -hydrochlorothiazide  (ZESTORETIC ) 20-12.5 MG tablet, Take 1 tablet by mouth daily., Disp: 90 tablet, Rfl: 3   Magnesium 250 MG TABS, Take 250 mg by mouth daily after lunch. , Disp: , Rfl:    memantine  (NAMENDA ) 5 MG tablet, Take 1 tablet (5 mg total) by mouth 2 (two) times daily. For memory, Disp: 180 tablet, Rfl: 3   Multiple Vitamin (MULTIVITAMIN WITH MINERALS) TABS tablet, Take 1 tablet by mouth daily after lunch. Women's One A Day Multivitamin , Disp: , Rfl:    simvastatin  (ZOCOR ) 20 MG tablet, Take 1 tablet (20 mg total) by mouth daily., Disp: 90 tablet, Rfl: 3 Social History   Socioeconomic History   Marital status: Single    Spouse name: Not on file   Number of children: 0   Years of education: 16   Highest education level: Bachelor's degree (e.g., BA, AB, BS)  Occupational History   Occupation: retired    Comment: Chief Strategy Officer  Tobacco Use   Smoking status: Never   Smokeless tobacco: Never  Vaping Use   Vaping status: Never Used  Substance and Sexual Activity   Alcohol use: Yes    Alcohol/week: 1.0 standard drink of alcohol    Types: 1 Glasses of wine per week    Comment: occasionally   Drug use: Never   Sexual activity: Not Currently    Partners: Male    Birth control/protection: Surgical  Other Topics Concern   Not on file  Social History Narrative   Lives alone.  No children   Her brother and sister live nearby   Social Drivers of Health   Financial Resource Strain: Low Risk  (02/29/2024)   Overall Financial Resource Strain (CARDIA)    Difficulty of Paying Living Expenses: Not hard at all  Food Insecurity: No Food Insecurity (02/29/2024)   Hunger Vital Sign    Worried About Running Out of Food in the Last Year: Never true     Ran Out of Food in the Last Year: Never true  Transportation Needs: No Transportation Needs (02/29/2024)   PRAPARE - Administrator, Civil Service (Medical): No    Lack of Transportation (Non-Medical): No  Physical Activity: Sufficiently Active (02/29/2024)   Exercise Vital Sign    Days of Exercise per Week: 5 days    Minutes of Exercise per Session: 60 min  Stress: No Stress Concern Present (02/29/2024)   [employer] of Occupational Health - Occupational Stress Questionnaire    Feeling of Stress : Only a little  Social Connections: Moderately Integrated (02/29/2024)   Social Connection and Isolation Panel    Frequency of Communication with Friends and Family: More than three times a week    Frequency of Social Gatherings with Friends and Family: Three times a week    Attends Religious Services: More than 4 times per year    Active Member of Clubs or Organizations: Yes    Attends Banker Meetings: Never    Marital Status: Never married  Intimate Partner Violence: Not At Risk (11/02/2023)   Humiliation, Afraid, Rape, and Kick questionnaire    Fear of Current or Ex-Partner: No    Emotionally Abused: No    Physically Abused: No    Sexually Abused: No   Family History  Problem Relation Age of Onset   High Cholesterol Mother    Hypertension Mother    Goiter Mother    High Cholesterol Father    Hypertension Father    Hypertension Brother    Lung disease Brother        chronic smoker   Heart disease Brother    Goiter Sister    Heart disease Brother    Hypertension Brother    Prostate cancer Brother 72   Lung cancer Maternal Uncle        smoker   Pancreatic cancer Nephew        dx. 40s/50s   Cancer Nephew        pancreatic vs. multiple myeloma; dx. 40s/50s   Colon cancer Neg Hx    Stomach cancer Neg Hx     Objective: Office vital signs reviewed. BP (!) 161/85   Pulse 82   Temp 98.3 F (36.8 C)   Ht 5' 4 (1.626 m)   Wt 157 lb (71.2 kg)    SpO2 96%   BMI 26.95 kg/m   Physical Examination:  General: Awake, alert, nontoxic elderly female, No acute distress HEENT: sclera white, MMM Cardio: regular rate and rhythm, S1S2 heard, no murmurs appreciated Pulm: clear to auscultation  bilaterally, no wheezes, rhonchi or rales; normal work of breathing on room air GI: Nondistended, nontender abdomen Neuro: Responds to questions appropriately and engages with the provider.  She does not appear to be responding to internal stimuli  Assessment/ Plan: 79 y.o. female   MCI (mild cognitive impairment) with memory loss  Elevated serum glucose - Plan: Bayer DCA Hb A1c Waived, CANCELED: Bayer DCA Hb A1c Waived  Postoperative hypothyroidism - Plan: TSH + free T4, CMP14+EGFR  Epigastric pain - Plan: CANCELED: CMP14+EGFR, CANCELED: Lipase, CANCELED: CBC with Differential  Plan to reach out to both social services and to her friend Roselie today and I will addend my note accordingly based on this conversations.  I have given her the neuro referral information again but it sounds like there is concerned that she is not safe to be at home independently anymore so I will try and see what social services assessment has been of her  I collected some labs today including her thyroid  levels given concerns that she may not be taking her medications appropriately.  Her epigastric pain has totally resolved independently and she had no focal findings on exam so I canceled the labs that had planned for her to look for pancreatitis etc.  She has close follow-up scheduled in the middle of July   Airyn Ellzey M Shawntia Mangal, DO [facility] [phone]

## 2024-06-21 ENCOUNTER — Ambulatory Visit: Payer: Self-pay | Admitting: Family Medicine

## 2024-06-21 ENCOUNTER — Telehealth: Payer: Self-pay | Admitting: Family Medicine

## 2024-06-21 LAB — CMP14+EGFR
ALT: 21 IU/L (ref 0–32)
AST: 26 IU/L (ref 0–40)
Albumin: 4.2 g/dL (ref 3.8–4.8)
Alkaline Phosphatase: 34 IU/L — ABNORMAL LOW (ref 44–121)
BUN/Creatinine Ratio: 10 — ABNORMAL LOW (ref 12–28)
BUN: 9 mg/dL (ref 8–27)
Bilirubin Total: 0.7 mg/dL (ref 0.0–1.2)
CO2: 22 mmol/L (ref 20–29)
Calcium: 9.2 mg/dL (ref 8.7–10.3)
Chloride: 101 mmol/L (ref 96–106)
Creatinine, Ser: 0.89 mg/dL (ref 0.57–1.00)
Globulin, Total: 2.6 g/dL (ref 1.5–4.5)
Glucose: 111 mg/dL — ABNORMAL HIGH (ref 70–99)
Potassium: 4.5 mmol/L (ref 3.5–5.2)
Sodium: 140 mmol/L (ref 134–144)
Total Protein: 6.8 g/dL (ref 6.0–8.5)
eGFR: 65 mL/min/{1.73_m2} (ref >59)

## 2024-06-21 LAB — TSH+FREE T4
Free T4: 1.07 ng/dL (ref 0.82–1.77)
TSH: 1.2 u[IU]/mL (ref 0.450–4.500)

## 2024-06-21 NOTE — Telephone Encounter (Signed)
 Received word from [place] (ext. [phone]) today that APS has accepted Christine Cantrell' case.  She has been assigned to Leotis Kitty, Child psychotherapist, ext [phone]) with [facility] Social Services.

## 2024-06-22 ENCOUNTER — Telehealth: Payer: Self-pay | Admitting: Family Medicine

## 2024-06-22 NOTE — Telephone Encounter (Signed)
 Brandi from Adult Protective Services calls to inform that she has been trying to get up with Ms. Arenz for the last couple days and has been unsuccessful.  She just wanted to call and touch base to make sure that she had not been hospitalized.  I informed her that she was actually here yesterday retrieving some medications.  She is going to try and contact Ms. Mayer family members to see if maybe they have been in communication with her recently

## 2024-07-08 ENCOUNTER — Telehealth: Payer: Self-pay | Admitting: Family Medicine

## 2024-07-08 NOTE — Telephone Encounter (Signed)
 Patient came in to see when her appt was. I wrote it on a card for her and gave it to her. Told her it was 07-12-2024 @ 11:00. Patient stated she was glad she came in because she lost the other card I gave her.

## 2024-07-11 ENCOUNTER — Inpatient Hospital Stay: Payer: Medicare Other | Attending: Hematology and Oncology | Admitting: Hematology and Oncology

## 2024-07-11 ENCOUNTER — Telehealth: Payer: Self-pay | Admitting: Family Medicine

## 2024-07-11 NOTE — Telephone Encounter (Signed)
 I reached out to [place] today to get updates on Christine Cantrell case.  She notes that her home environment appears to be safe and acceptable for independent living.  At this time she does believe her to have capacity and this ties her hands for any further intervention.  However, she acknowledges that patient is in fact demented and at this point we simply have to wait for her to continue to decline before they can intervene.

## 2024-07-11 NOTE — Assessment & Plan Note (Deleted)
 07/06/2020:Screening mammogram detected a 0.8cm right breast mass. Diagnostic mammogram showed the mass measuring 0.7cm, with no right axillary adenopathy. 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 T1BN0 stage Ia   08/22/2020:Right lumpectomy ([facility]): IDC, 1.1cm, grade 2, clear margins.  ER 95%, PR 95%, Ki-67 30%, IHC equivocal, FISH negative Refused radiation therapy.   Treatment plan: adjuvant antiestrogen therapy with anastrozole  1 mg daily x5 years started 09/04/2020   Anastrozole  Toxicities: Denies any hot flashes or arthralgias or myalgias.   Breast Cancer Surveillance: 1. Breast Exam 07/11/2024: Benign 2. 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 Leigh)   Return to clinic in 1 year for follow-up

## 2024-07-12 ENCOUNTER — Ambulatory Visit: Payer: Medicare Other | Admitting: Family Medicine

## 2024-07-12 ENCOUNTER — Encounter: Payer: Self-pay | Admitting: Family Medicine

## 2024-07-12 ENCOUNTER — Telehealth: Payer: Self-pay | Admitting: Family Medicine

## 2024-07-12 VITALS — BP 115/58 | HR 100 | Temp 98.2°F | Ht 64.0 in | Wt 159.2 lb

## 2024-07-12 DIAGNOSIS — G3184 Mild cognitive impairment, so stated: Secondary | ICD-10-CM

## 2024-07-12 DIAGNOSIS — G319 Degenerative disease of nervous system, unspecified: Secondary | ICD-10-CM

## 2024-07-12 DIAGNOSIS — Z23 Encounter for immunization: Secondary | ICD-10-CM

## 2024-07-12 NOTE — Patient Instructions (Signed)
 Please call to schedule a visit with the memory doctor.  This referral has been ordered since January but you keep forgetting to schedule a visit. I really want them to assess where your memory loss is and how to get it better.  [facility] [address], [address] [place] [address] [phone]

## 2024-07-12 NOTE — Telephone Encounter (Signed)
 Patient comes back to look for her glasses. States they are here in the back. Rilla went with patient to look for them.

## 2024-07-12 NOTE — Progress Notes (Signed)
 Subjective: CC: Follow-up MCI PCP: Jolinda Christine HERO, DO YEP:Christine Cantrell is a 78 y.o. female presenting to clinic today for:  1.  Cognitive impairment with memory loss Since her last visit patient has started being monitored by Adult Protective Services.  There was concern for impairment in capacity as she showed signs of not remembering visits, lab draws etc.  Short-term memory seems to be an issue.  She still has not sought care at the neurology office as recommended at last visit, which she was agreeable to.  She voices today that she did not have recollection of that conversation but is amenable to the appointment  She reports no difficulty with self-feeding, self dressing or self-care.  She voices no concerns today.   ROS: Per HPI  No Known Allergies Past Medical History:  Diagnosis Date   Abnormal EKG saw dr hochrein 3 yrs ago for   hx of left bundle branch block on ekg since 2002   Breast cancer [facility])    Depression    Diverticulosis large intestine w/o perforation or abscess w/bleeding 2016   Family history of lung cancer    Family history of pancreatic cancer    Family history of prostate cancer    GERD (gastroesophageal reflux disease)    hx of   High cholesterol    History of hiatal hernia    Hx of colonic polyps 2016   adenomatous   Hypertension    Hypothyroidism    Pancreatic cyst    Thyroid  disease    hypothyroidism    Current Outpatient Medications:    anastrozole  (ARIMIDEX ) 1 MG tablet, TAKE 1 TABLET BY MOUTH EVERY DAY, Disp: 90 tablet, Rfl: 3   Coenzyme Q10-Vitamin E (QUNOL ULTRA COQ10 PO), Take 1 capsule by mouth daily after lunch. , Disp: , Rfl:    Krill Oil 500 MG CAPS, Take 500 mg by mouth daily after lunch. , Disp: , Rfl:    levothyroxine  (SYNTHROID ) 75 MCG tablet, Take 1 tablet (75 mcg total) by mouth daily before breakfast., Disp: 90 tablet, Rfl: 3   lisinopril -hydrochlorothiazide  (ZESTORETIC ) 20-12.5 MG tablet, Take 1 tablet by mouth daily.,  Disp: 90 tablet, Rfl: 3   Magnesium 250 MG TABS, Take 250 mg by mouth daily after lunch. , Disp: , Rfl:    memantine  (NAMENDA ) 5 MG tablet, Take 1 tablet (5 mg total) by mouth 2 (two) times daily. For memory, Disp: 180 tablet, Rfl: 3   Multiple Vitamin (MULTIVITAMIN WITH MINERALS) TABS tablet, Take 1 tablet by mouth daily after lunch. Women's One A Day Multivitamin , Disp: , Rfl:    simvastatin  (ZOCOR ) 20 MG tablet, Take 1 tablet (20 mg total) by mouth daily., Disp: 90 tablet, Rfl: 3 Social History   Socioeconomic History   Marital status: Single    Spouse name: Not on file   Number of children: 0   Years of education: 16   Highest education level: Bachelor's degree (e.g., BA, AB, BS)  Occupational History   Occupation: retired    Comment: Chief Strategy Officer  Tobacco Use   Smoking status: Never   Smokeless tobacco: Never  Vaping Use   Vaping status: Never Used  Substance and Sexual Activity   Alcohol use: Yes    Alcohol/week: 1.0 standard drink of alcohol    Types: 1 Glasses of wine per week    Comment: occasionally   Drug use: Never   Sexual activity: Not Currently    Partners: Male    Birth control/protection: Surgical  Other Topics Concern   Not on file  Social History Narrative   Lives alone.  No children   Her brother and sister live nearby   Social Drivers of Health   Financial Resource Strain: Low Risk  (02/29/2024)   Overall Financial Resource Strain (CARDIA)    Difficulty of Paying Living Expenses: Not hard at all  Food Insecurity: No Food Insecurity (02/29/2024)   Hunger Vital Sign    Worried About Running Out of Food in the Last Year: Never true    Ran Out of Food in the Last Year: Never true  Transportation Needs: No Transportation Needs (02/29/2024)   PRAPARE - Administrator, Civil Service (Medical): No    Lack of Transportation (Non-Medical): No  Physical Activity: Sufficiently Active (02/29/2024)   Exercise Vital Sign    Days of Exercise per  Week: 5 days    Minutes of Exercise per Session: 60 min  Stress: No Stress Concern Present (02/29/2024)   [employer] of Occupational Health - Occupational Stress Questionnaire    Feeling of Stress : Only a little  Social Connections: Moderately Integrated (02/29/2024)   Social Connection and Isolation Panel    Frequency of Communication with Friends and Family: More than three times a week    Frequency of Social Gatherings with Friends and Family: Three times a week    Attends Religious Services: More than 4 times per year    Active Member of Clubs or Organizations: Yes    Attends Banker Meetings: Never    Marital Status: Never married  Intimate Partner Violence: Not At Risk (11/02/2023)   Humiliation, Afraid, Rape, and Kick questionnaire    Fear of Current or Ex-Partner: No    Emotionally Abused: No    Physically Abused: No    Sexually Abused: No   Family History  Problem Relation Age of Onset   High Cholesterol Mother    Hypertension Mother    Goiter Mother    High Cholesterol Father    Hypertension Father    Hypertension Brother    Lung disease Brother        chronic smoker   Heart disease Brother    Goiter Sister    Heart disease Brother    Hypertension Brother    Prostate cancer Brother 72   Lung cancer Maternal Uncle        smoker   Pancreatic cancer Nephew        dx. 40s/50s   Cancer Nephew        pancreatic vs. multiple myeloma; dx. 40s/50s   Colon cancer Neg Hx    Stomach cancer Neg Hx     Objective: Office vital signs reviewed. BP (!) 115/58   Pulse 100   Temp 98.2 F (36.8 C)   Ht 5' 4 (1.626 m)   Wt 159 lb 3.2 oz (72.2 kg)   SpO2 96%   BMI 27.33 kg/m   Physical Examination:  General: Awake, alert, well-appearing but slightly disheveled elderly female, No acute distress HEENT: Sclera white.  Moist mucous membranes Cardio: regular rate and rhythm, S1S2 heard, no murmurs appreciated Pulm: clear to auscultation bilaterally, no  wheezes, rhonchi or rales; normal work of breathing on room air Neuro: Pleasant, interactive.     01/12/2024   10:53 AM 06/15/2023    1:01 PM 03/14/2023    8:37 AM  MMSE - Mini Mental State Exam  Orientation to time 0 2 1  Orientation to Place 5 5  5  Registration 3 2 2   Attention/ Calculation 2 5 3   Recall 3 2 3   Language- name 2 objects 2 2 2   Language- repeat 1 1 1   Language- follow 3 step command 2 2 3   Language- read & follow direction 1 1 1   Write a sentence 1 1 1   Copy design 1 1 1   Total score 21 24 23      Assessment/ Plan: 78 y.o. female   MCI (mild cognitive impairment) with memory loss  Cerebral atrophy (HCC)  Again reinforced need for neurologic evaluation.  Adult Protective Services is involved but have no plans to proceed with any court interventions at this time as patient still appears to have capacity according to their evaluation.  Tetanus shot administered  Christine CHRISTELLA Fielding, DO [facility] [phone]

## 2024-07-12 NOTE — Telephone Encounter (Signed)
 Patient comes back in @ 9:40 to see when her appt is and told patient it was 11:00. She asked if she could get checked in because it was cooler in the office.

## 2024-07-12 NOTE — Telephone Encounter (Signed)
 Pt came in around 9:00am today asking, if she has an appt today w/G[facility]. I told her that she did & it was at 11:00am today. She is going to go get something to eat & she will be back at 10:45am-11:00am.

## 2024-07-12 NOTE — Telephone Encounter (Signed)
 Patient comes back after her appt to look for her glasses. Patient went back to see if she left them in the room. I asked her if she might have put them in her pocketbook and states she already looked to see if they were in her pocketbook. Said she don't understand what happened to them.

## 2024-07-13 ENCOUNTER — Telehealth: Payer: Self-pay | Admitting: Family Medicine

## 2024-07-13 NOTE — Telephone Encounter (Signed)
 Patient called to let us  know that she lost her glasses and wants to know if we have seen any here at the office. Explained to E2C2 that we have not found any glasses laying around and to relay the message to patient.

## 2024-07-13 NOTE — Telephone Encounter (Signed)
 Nope Christine Cantrell has looked for them. She wasn't wearing them when she came in as far as I know

## 2024-07-15 ENCOUNTER — Telehealth: Payer: Self-pay | Admitting: Family Medicine

## 2024-07-15 NOTE — Telephone Encounter (Signed)
 Pt came in today asking for her next apt. An appt reminder was given.

## 2024-07-26 DIAGNOSIS — Z1231 Encounter for screening mammogram for malignant neoplasm of breast: Secondary | ICD-10-CM | POA: Diagnosis not present

## 2024-07-26 LAB — HM MAMMOGRAPHY

## 2024-07-27 ENCOUNTER — Telehealth: Payer: Self-pay | Admitting: Family Medicine

## 2024-07-27 NOTE — Telephone Encounter (Signed)
 Pt came in asking about her Mammogram results from last week. I did  not anything in her chart. And she had her glasses on, so she must have found her glasses that she had misplaced last time.

## 2024-07-27 NOTE — Telephone Encounter (Signed)
 Reviewed chart - previous mammogram have been done with [facility]. Results have not been received by our office at this time. Imaging centers typically contact patient with mammogram results and or send a letter to them. If patient has not received the results she should contact [facility]. If the patient is needing something else regarding the mammogram please send me a detailed message. I attempted to contact patient by phone, there was no answer and no voicemail.

## 2024-07-28 ENCOUNTER — Other Ambulatory Visit: Payer: Self-pay | Admitting: Hematology and Oncology

## 2024-07-29 ENCOUNTER — Telehealth: Payer: Self-pay | Admitting: Family Medicine

## 2024-07-29 NOTE — Telephone Encounter (Signed)
 Pt doesn't need appt. She just came in and asked when her next one was. It was just and fyi for dr g

## 2024-07-29 NOTE — Telephone Encounter (Signed)
 Contacted patient and read normal result. Patient asked for a hard copy.  I informed the [facility] has ownership of documentation and that she would need to contact them.

## 2024-07-29 NOTE — Telephone Encounter (Signed)
 Pt came in the office wanting to know when her next appointment was scheduled for.

## 2024-08-05 ENCOUNTER — Telehealth: Payer: Self-pay | Admitting: Family Medicine

## 2024-08-05 NOTE — Telephone Encounter (Signed)
 Ms Jerrell calls to inform me a new DSS/APS case was opened on Ms Appenzeller and she inquired about patient's cognitive ability.  Discussed that patient has had gradual decline over the last couple of years.  She frequently forgets when she comes in for appointments, forgets that she saw providers/ had labs done/ etc.  She has been referred to neurology for more definitive diagnosis but sadly she seems to forget why she is supposed to see them and sometimes becomes irritable about the referral.  Her mood is somewhat labile according to front office staff.  Though she has always been pleasant and amenable to referral during our visits.  Very concerned about progression of short term memory loss.  Roselie, Ms Rostad' friend, has been trying to help the patient but she [place] in another [place] so her efforts are limited.  She has had first hand account of marked memory changes in the patient when the patient spent some time with her.  She has a brother who is somewhat involved as well who may be someone to call regarding her mental status.

## 2024-08-05 NOTE — Telephone Encounter (Signed)
 FL2 faxed to [facility], [facility] at [phone]

## 2024-08-08 ENCOUNTER — Ambulatory Visit: Admitting: Family Medicine

## 2024-08-08 ENCOUNTER — Encounter: Payer: Self-pay | Admitting: Family Medicine

## 2024-08-08 VITALS — BP 169/96 | HR 93 | Temp 97.5°F | Ht 66.0 in | Wt 155.4 lb

## 2024-08-08 DIAGNOSIS — L570 Actinic keratosis: Secondary | ICD-10-CM

## 2024-08-08 DIAGNOSIS — I10 Essential (primary) hypertension: Secondary | ICD-10-CM

## 2024-08-08 NOTE — Patient Instructions (Signed)
 Cryoablation, Care After The following information offers guidance on how to care for yourself after your procedure. Your health care provider may also give you more specific instructions. If you have problems or questions, contact your health care provider. What can I expect after the procedure? After the procedure, it is common to have: Redness or blisters near the area treated. Mild pain and swelling. Follow these instructions at home: Treatment area care  If you have an incision, follow instructions from your health care provider about how to take care of it. Make sure you: Wash your hands with soap and water  for at least 20 seconds before and after you change your bandage (dressing). If soap and water  are not available, use hand sanitizer. Change your dressing as told by your health care provider. Leave stitches (sutures), skin glue, or adhesive strips in place. These skin closures may need to stay in place for 2 weeks or longer. If adhesive strip edges start to loosen and curl up, you may trim the loose edges. Do not remove adhesive strips completely unless your health care provider tells you to do that. Check your treatment area every day for signs of infection. Check for: More redness, swelling, or pain. Fluid or blood. Warmth. Pus or a bad smell. Keep the treated area clean and dry. Keep it covered with a dressing until it has healed. Clean the area with soap and water  as told by your health care provider. If your dressing gets wet, change it right away. Activity  Follow instructions from your health care provider about what activities are safe for you. You may have to avoid lifting. Ask your health care provider how much you can safely lift. If you were given a sedative during the procedure, it can affect you for several hours. Do not drive or operate machinery until your health care provider says that it is safe. General instructions Take over-the-counter and prescription  medicines only as told by your health care provider. Do not use any products that contain nicotine or tobacco. These products include cigarettes, chewing tobacco, and vaping devices, such as e-cigarettes. These can delay incision healing. If you need help quitting, ask your health care provider. Do not take baths, swim, or use a hot tub until your health care provider approves. Ask your health care provider if you may take showers. You may only be allowed to take sponge baths. Keep all follow-up visits. Your health care provider may need to check that treatment worked and that there were no problems caused by the procedure. Contact a health care provider if: You have more pain. You have a fever. You have nausea or vomiting. You have any signs of infection. You do not have a bowel movement for 2 days. You cannot urinate, or you cannot control when you urinate or have a bowel movement (have incontinence). You develop impotence. Get help right away if: You have severe pain. You have trouble swallowing or breathing. You are very weak or dizzy. You have chest pain or shortness of breath. These symptoms may be an emergency. Get help right away. Call 911. Do not wait to see if the symptoms will go away. Do not drive yourself to the hospital. This information is not intended to replace advice given to you by your health care provider. Make sure you discuss any questions you have with your health care provider. Document Revised: 05/30/2022 Document Reviewed: 05/30/2022 Elsevier Patient Education  2024 [employer].

## 2024-08-08 NOTE — Progress Notes (Signed)
 Subjective: CC: Skin lesion PCP: Jolinda Norene HERO, DO YEP:Christine Cantrell is a 78 y.o. female presenting to clinic today for:  1.  Skin lesion Patient reports that she thinks the lesion on left forearm has been present for a few weeks but she is been picking at it and it has been bleeding more easily.  She does not report any pain.  No swelling.  No preceding injury.  Has not used anything for treatment thus far   ROS: Per HPI  No Known Allergies Past Medical History:  Diagnosis Date   Abnormal EKG saw dr hochrein 3 yrs ago for   hx of left bundle branch block on ekg since 2002   Breast cancer [facility]    Depression    Diverticulosis large intestine w/o perforation or abscess w/bleeding 2016   Family history of lung cancer    Family history of pancreatic cancer    Family history of prostate cancer    GERD (gastroesophageal reflux disease)    hx of   High cholesterol    History of hiatal hernia    Hx of colonic polyps 2016   adenomatous   Hypertension    Hypothyroidism    Pancreatic cyst    Thyroid  disease    hypothyroidism    Current Outpatient Medications:    anastrozole  (ARIMIDEX ) 1 MG tablet, TAKE 1 TABLET BY MOUTH EVERY DAY, Disp: 90 tablet, Rfl: 3   Coenzyme Q10-Vitamin E (QUNOL ULTRA COQ10 PO), Take 1 capsule by mouth daily after lunch. , Disp: , Rfl:    Krill Oil 500 MG CAPS, Take 500 mg by mouth daily after lunch. , Disp: , Rfl:    levothyroxine  (SYNTHROID ) 75 MCG tablet, Take 1 tablet (75 mcg total) by mouth daily before breakfast., Disp: 90 tablet, Rfl: 3   lisinopril -hydrochlorothiazide  (ZESTORETIC ) 20-12.5 MG tablet, Take 1 tablet by mouth daily., Disp: 90 tablet, Rfl: 3   Magnesium 250 MG TABS, Take 250 mg by mouth daily after lunch. , Disp: , Rfl:    memantine  (NAMENDA ) 5 MG tablet, Take 1 tablet (5 mg total) by mouth 2 (two) times daily. For memory, Disp: 180 tablet, Rfl: 3   Multiple Vitamin (MULTIVITAMIN WITH MINERALS) TABS tablet, Take 1 tablet by mouth  daily after lunch. Women's One A Day Multivitamin , Disp: , Rfl:    simvastatin  (ZOCOR ) 20 MG tablet, Take 1 tablet (20 mg total) by mouth daily., Disp: 90 tablet, Rfl: 3 Social History   Socioeconomic History   Marital status: Single    Spouse name: Not on file   Number of children: 0   Years of education: 16   Highest education level: Bachelor's degree (e.g., BA, AB, BS)  Occupational History   Occupation: retired    Comment: Chief Strategy Officer  Tobacco Use   Smoking status: Never   Smokeless tobacco: Never  Vaping Use   Vaping status: Never Used  Substance and Sexual Activity   Alcohol use: Yes    Alcohol/week: 1.0 standard drink of alcohol    Types: 1 Glasses of wine per week    Comment: occasionally   Drug use: Never   Sexual activity: Not Currently    Partners: Male    Birth control/protection: Surgical  Other Topics Concern   Not on file  Social History Narrative   Lives alone.  No children   Her brother and sister live nearby   Social Drivers of Health   Financial Resource Strain: Low Risk  (02/29/2024)   Overall  Financial Resource Strain (CARDIA)    Difficulty of Paying Living Expenses: Not hard at all  Food Insecurity: No Food Insecurity (02/29/2024)   Hunger Vital Sign    Worried About Running Out of Food in the Last Year: Never true    Ran Out of Food in the Last Year: Never true  Transportation Needs: No Transportation Needs (02/29/2024)   PRAPARE - Administrator, Civil Service (Medical): No    Lack of Transportation (Non-Medical): No  Physical Activity: Sufficiently Active (02/29/2024)   Exercise Vital Sign    Days of Exercise per Week: 5 days    Minutes of Exercise per Session: 60 min  Stress: No Stress Concern Present (02/29/2024)   [employer] of Occupational Health - Occupational Stress Questionnaire    Feeling of Stress : Only a little  Social Connections: Moderately Integrated (02/29/2024)   Social Connection and Isolation Panel     Frequency of Communication with Friends and Family: More than three times a week    Frequency of Social Gatherings with Friends and Family: Three times a week    Attends Religious Services: More than 4 times per year    Active Member of Clubs or Organizations: Yes    Attends Banker Meetings: Never    Marital Status: Never married  Intimate Partner Violence: Not At Risk (11/02/2023)   Humiliation, Afraid, Rape, and Kick questionnaire    Fear of Current or Ex-Partner: No    Emotionally Abused: No    Physically Abused: No    Sexually Abused: No   Family History  Problem Relation Age of Onset   High Cholesterol Mother    Hypertension Mother    Goiter Mother    High Cholesterol Father    Hypertension Father    Hypertension Brother    Lung disease Brother        chronic smoker   Heart disease Brother    Goiter Sister    Heart disease Brother    Hypertension Brother    Prostate cancer Brother 72   Lung cancer Maternal Uncle        smoker   Pancreatic cancer Nephew        dx. 40s/50s   Cancer Nephew        pancreatic vs. multiple myeloma; dx. 40s/50s   Colon cancer Neg Hx    Stomach cancer Neg Hx     Objective: Office vital signs reviewed. BP (!) 169/96   Pulse 93   Temp (!) 97.5 F (36.4 C)   Ht 5' 6 (1.676 m)   Wt 155 lb 6 oz (70.5 kg)   SpO2 92%   BMI 25.08 kg/m   Physical Examination:  General: Awake, alert, nontoxic female, No acute distress Cardio: RRR Pulm: normal WOB on room air Skin: Multiple areas of hyperpigmentation and hypopigmentation on the on the bilateral forearms.  She has a hyperkeratotic lesion on the dorsal aspect of the mid left forearm that is concerning for actinic keratosis versus squamous cell carcinoma.  There is some dried blood over it.  No central umbilication or rolled borders.  No visible hypervascularity with the naked eye  Cryotherapy Procedure:  Risks and benefits of procedure were reviewed with the patient.  Written  consent obtained and scanned into the chart.  Lesion of concern was identified and located on left forearm.  Liquid nitrogen was applied to area of concern and extending out 1 millimeters beyond the border of the lesion.  Treated area  was allowed to come back to room temperature before treating 2 more times.  Patient tolerated procedure well and there were no immediate complications.  Home care instructions were reviewed with the patient and a handout was provided.   Assessment/ Plan: 78 y.o. 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  Home care instructions were reviewed and a handout was provided.  May need to consider referral to dermatology pending response  Blood pressure not controlled but had not taken her blood pressure medications prior to arrival.  Advised 2-week follow-up with nurse for blood pressure recheck   Christine Michiels CHRISTELLA Fielding, DO [facility] [phone]

## 2024-08-09 ENCOUNTER — Telehealth: Payer: Self-pay | Admitting: Family Medicine

## 2024-08-09 NOTE — Telephone Encounter (Signed)
 Pt came in wanting to know if Dr KANDICE was referring her to dermatalogist for place on arm

## 2024-08-09 NOTE — Telephone Encounter (Signed)
 Pt came in to office for appointment reminder

## 2024-08-16 ENCOUNTER — Inpatient Hospital Stay: Attending: Hematology and Oncology | Admitting: Hematology and Oncology

## 2024-08-16 VITALS — BP 183/76 | HR 87 | Temp 98.0°F | Resp 20 | Ht 66.0 in | Wt 157.1 lb

## 2024-08-16 DIAGNOSIS — Z170 Estrogen receptor positive status [ER+]: Secondary | ICD-10-CM | POA: Insufficient documentation

## 2024-08-16 DIAGNOSIS — Z1721 Progesterone receptor positive status: Secondary | ICD-10-CM | POA: Insufficient documentation

## 2024-08-16 DIAGNOSIS — Z1732 Human epidermal growth factor receptor 2 negative status: Secondary | ICD-10-CM | POA: Diagnosis not present

## 2024-08-16 DIAGNOSIS — Z79811 Long term (current) use of aromatase inhibitors: Secondary | ICD-10-CM | POA: Diagnosis not present

## 2024-08-16 DIAGNOSIS — C50211 Malignant neoplasm of upper-inner quadrant of right female breast: Secondary | ICD-10-CM | POA: Diagnosis not present

## 2024-08-16 NOTE — Progress Notes (Signed)
 Patient Care Team: Jolinda Norene HERO, DO as PCP - General (Family Medicine) Aron Shoulders, MD as Consulting Physician (General Surgery) Odean Potts, MD as Consulting Physician (Hematology and Oncology) Shannon Agent, MD as Consulting Physician (Radiation Oncology) Robinson Mayo, OD as Referring Physician (Optometry)  DIAGNOSIS:  Encounter Diagnosis  Name Primary?   Malignant neoplasm of upper-inner quadrant of right breast in female, estrogen receptor positive (HCC) Yes    SUMMARY OF ONCOLOGIC HISTORY: Oncology History  Malignant neoplasm of upper-inner quadrant of right breast in female, estrogen receptor positive (HCC)  07/06/2020 Initial Diagnosis   Screening mammogram detected a 0.8cm right breast mass. Diagnostic mammogram showed the mass measuring 0.7cm, with no right axillary adenopathy. 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   07/11/2020 Cancer Staging   Staging form: Breast, AJCC 8th Edition - Clinical stage from 07/11/2020: Stage IA (cT1b, cN0, cM0, G2, ER+, PR+, HER2-)   08/22/2020 Surgery   Right lumpectomy [facility]) ([id number]): IDC, 1.1cm, grade 2, clear margins. No regional lymph nodes were examined.   08/2020 - 08/2025 Anti-estrogen oral therapy   Anatrozole   11/04/2020 Cancer Staging   Staging form: Breast, AJCC 8th Edition - Pathologic: Stage IA (pT1c, pN0, cM0, G2, ER+, PR+, HER2-)     CHIEF COMPLIANT: Follow-up of breast cancer on anastrozole  therapy  HISTORY OF PRESENT ILLNESS:  History of Present Illness Christine Cantrell is an 78 year old female with breast cancer who presents for follow-up.  She is currently on anastrozole  therapy, with one year remaining. She experiences no breast pain or discomfort. There are no side effects such as hot flashes or joint stiffness from the medication. Her blood pressure has been elevated recently, with a reading of 169/96 and a pulse rate of 93, which she attributes to  stress. She has previously monitored her blood pressure at home and [facility] [place].     ALLERGIES:  has no known allergies.  MEDICATIONS:  Current Outpatient Medications  Medication Sig Dispense Refill   anastrozole  (ARIMIDEX ) 1 MG tablet TAKE 1 TABLET BY MOUTH EVERY DAY 90 tablet 3   Coenzyme Q10-Vitamin E (QUNOL ULTRA COQ10 PO) Take 1 capsule by mouth daily after lunch.      Krill Oil 500 MG CAPS Take 500 mg by mouth daily after lunch.      levothyroxine  (SYNTHROID ) 75 MCG tablet Take 1 tablet (75 mcg total) by mouth daily before breakfast. 90 tablet 3   lisinopril -hydrochlorothiazide  (ZESTORETIC ) 20-12.5 MG tablet Take 1 tablet by mouth daily. 90 tablet 3   Magnesium 250 MG TABS Take 250 mg by mouth daily after lunch.      memantine  (NAMENDA ) 5 MG tablet Take 1 tablet (5 mg total) by mouth 2 (two) times daily. For memory 180 tablet 3   Multiple Vitamin (MULTIVITAMIN WITH MINERALS) TABS tablet Take 1 tablet by mouth daily after lunch. Women's One A Day Multivitamin      simvastatin  (ZOCOR ) 20 MG tablet Take 1 tablet (20 mg total) by mouth daily. 90 tablet 3   No current facility-administered medications for this visit.    PHYSICAL EXAMINATION: ECOG PERFORMANCE STATUS: 1 - Symptomatic but completely ambulatory  Vitals:   08/16/24 1143 08/16/24 1146  BP: (!) 179/70 (!) 183/76  Pulse: 87   Resp: 20   Temp: 98 F (36.7 C)   SpO2: 100%    Filed Weights   08/16/24 1143  Weight: 157 lb 1.6 oz (71.3  kg)    Physical Exam VITALS: P- 93, BP- 169/96 BREAST: Breasts normal on examination.  (exam performed in the presence of a chaperone)  LABORATORY DATA:  I have reviewed the data as listed    Latest Ref Rng & Units 06/20/2024    8:28 AM 01/12/2024   11:18 AM 03/13/2023   11:06 AM  CMP  Glucose 70 - 99 mg/dL 888  814  873   BUN 8 - 27 mg/dL 9  13  9    Creatinine 0.57 - 1.00 mg/dL 9.10  9.17  9.12   Sodium 134 - 144 mmol/L 140  140  138   Potassium 3.5 - 5.2 mmol/L 4.5   4.2  4.4   Chloride 96 - 106 mmol/L 101  97  99   CO2 20 - 29 mmol/L 22  22  25    Calcium 8.7 - 10.3 mg/dL 9.2  9.6  9.5   Total Protein 6.0 - 8.5 g/dL 6.8  7.1  6.8   Total Bilirubin 0.0 - 1.2 mg/dL 0.7  0.7  0.6   Alkaline Phos 44 - 121 IU/L 34  44  37   AST 0 - 40 IU/L 26  24  22    ALT 0 - 32 IU/L 21  20  18      Lab Results  Component Value Date   WBC 5.4 01/12/2024   HGB 16.0 (H) 01/12/2024   HCT 46.5 01/12/2024   MCV 97 01/12/2024   PLT 175 01/12/2024   NEUTROABS 3.9 07/11/2020    ASSESSMENT & PLAN:  Malignant neoplasm of upper-inner quadrant of right breast in female, estrogen receptor positive (HCC) 07/06/2020:Screening mammogram detected a 0.8cm right breast mass. Diagnostic mammogram showed the mass measuring 0.7cm, with no right axillary adenopathy. 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 T1BN0 stage Ia   08/22/2020:Right lumpectomy ([facility]): IDC, 1.1cm, grade 2, clear margins.  ER 95%, PR 95%, Ki-67 30%, IHC equivocal, FISH negative Refused radiation therapy.   Treatment plan: adjuvant antiestrogen therapy with anastrozole  1 mg daily x5 years started 09/04/2020   Anastrozole  Toxicities: Denies any hot flashes or arthralgias or myalgias.   Breast Cancer Surveillance: 1. Breast Exam 08/16/2024: Benign 2. Mammogram: 07/26/2024 and [facility]: Benign   MRCP 11/27/2022: Stable simple cystic lesion posterior aspect of pancreatic head similar to 2019 compatible with small pancreatic pseudocyst (follows with Dr. Leigh)   Return to clinic in 1 year for follow-up ------------------------------------- Assessment and Plan Assessment & Plan Estrogen receptor positive malignant neoplasm of upper-inner quadrant of right breast on anastrozole  therapy On anastrozole  for estrogen receptor positive breast cancer, with one year remaining in a five-year course. Anastrozole  reduces recurrence risk by lowering estrogen levels. - Continue  anastrozole  therapy for one more year. - Continue regular mammograms. - Schedule follow-up appointment in one year.      No orders of the defined types were placed in this encounter.  The patient has a good understanding of the overall plan. she agrees with it. she will call with any problems that may develop before the next visit here. Total time spent: 30 mins including face to face time and time spent for planning, charting and co-ordination of care   Naomi MARLA Chad, MD 08/16/24

## 2024-08-16 NOTE — Assessment & Plan Note (Signed)
 07/06/2020:Screening mammogram detected a 0.8cm right breast mass. Diagnostic mammogram showed the mass measuring 0.7cm, with no right axillary adenopathy. 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 T1BN0 stage Ia   08/22/2020:Right lumpectomy (Byerly): IDC, 1.1cm, grade 2, clear margins.  ER 95%, PR 95%, Ki-67 30%, IHC equivocal, FISH negative Refused radiation therapy.   Treatment plan: adjuvant antiestrogen therapy with anastrozole  1 mg daily x5 years started 09/04/2020   Anastrozole  Toxicities: Denies any hot flashes or arthralgias or myalgias.   Breast Cancer Surveillance: 1. Breast Exam 08/16/2024: Benign 2. Mammogram: 07/26/2024 and [facility]: Benign   MRCP 11/27/2022: Stable simple cystic lesion posterior aspect of pancreatic head similar to 2019 compatible with small pancreatic pseudocyst (follows with Dr. Leigh)   Return to clinic in 1 year for follow-up

## 2024-08-26 ENCOUNTER — Telehealth: Payer: Self-pay | Admitting: Family Medicine

## 2024-08-26 NOTE — Telephone Encounter (Signed)
 Pt came in to have us  print her appt's out for her.

## 2024-09-07 ENCOUNTER — Encounter (HOSPITAL_COMMUNITY): Payer: Self-pay

## 2024-09-07 ENCOUNTER — Other Ambulatory Visit: Payer: Self-pay

## 2024-09-07 ENCOUNTER — Emergency Department (HOSPITAL_COMMUNITY)
Admission: EM | Admit: 2024-09-07 | Discharge: 2024-09-23 | Disposition: A | Discharge status: Home / Self Care | Attending: Emergency Medicine | Admitting: Emergency Medicine

## 2024-09-07 DIAGNOSIS — F4321 Adjustment disorder with depressed mood: Secondary | ICD-10-CM | POA: Diagnosis not present

## 2024-09-07 DIAGNOSIS — F329 Major depressive disorder, single episode, unspecified: Secondary | ICD-10-CM | POA: Diagnosis present

## 2024-09-07 DIAGNOSIS — F4320 Adjustment disorder, unspecified: Secondary | ICD-10-CM | POA: Diagnosis not present

## 2024-09-07 DIAGNOSIS — Z046 Encounter for general psychiatric examination, requested by authority: Secondary | ICD-10-CM

## 2024-09-07 DIAGNOSIS — R410 Disorientation, unspecified: Secondary | ICD-10-CM

## 2024-09-07 DIAGNOSIS — F0390 Unspecified dementia without behavioral disturbance: Secondary | ICD-10-CM | POA: Diagnosis present

## 2024-09-07 DIAGNOSIS — R4189 Other symptoms and signs involving cognitive functions and awareness: Secondary | ICD-10-CM

## 2024-09-07 DIAGNOSIS — R419 Unspecified symptoms and signs involving cognitive functions and awareness: Secondary | ICD-10-CM

## 2024-09-07 DIAGNOSIS — F03911 Unspecified dementia, unspecified severity, with agitation: Secondary | ICD-10-CM | POA: Diagnosis not present

## 2024-09-07 DIAGNOSIS — I10 Essential (primary) hypertension: Secondary | ICD-10-CM | POA: Diagnosis not present

## 2024-09-07 DIAGNOSIS — E039 Hypothyroidism, unspecified: Secondary | ICD-10-CM | POA: Diagnosis not present

## 2024-09-07 DIAGNOSIS — Z853 Personal history of malignant neoplasm of breast: Secondary | ICD-10-CM | POA: Insufficient documentation

## 2024-09-07 DIAGNOSIS — Z79899 Other long term (current) drug therapy: Secondary | ICD-10-CM | POA: Insufficient documentation

## 2024-09-07 DIAGNOSIS — F4329 Adjustment disorder with other symptoms: Secondary | ICD-10-CM | POA: Diagnosis not present

## 2024-09-07 DIAGNOSIS — R45851 Suicidal ideations: Secondary | ICD-10-CM | POA: Diagnosis not present

## 2024-09-07 LAB — COMPREHENSIVE METABOLIC PANEL WITH GFR
ALT: 21 U/L (ref 0–44)
AST: 23 U/L (ref 15–41)
Albumin: 3.6 g/dL (ref 3.5–5.0)
Alkaline Phosphatase: 26 U/L — ABNORMAL LOW (ref 38–126)
Anion gap: 15 (ref 5–15)
BUN: 14 mg/dL (ref 8–23)
CO2: 22 mmol/L (ref 22–32)
Calcium: 9 mg/dL (ref 8.9–10.3)
Chloride: 99 mmol/L (ref 98–111)
Creatinine, Ser: 0.79 mg/dL (ref 0.44–1.00)
GFR, Estimated: >60 mL/min (ref >60)
Glucose, Bld: 118 mg/dL — ABNORMAL HIGH (ref 70–99)
Potassium: 3.6 mmol/L (ref 3.5–5.1)
Sodium: 136 mmol/L (ref 135–145)
Total Bilirubin: 1.2 mg/dL (ref 0.0–1.2)
Total Protein: 6.7 g/dL (ref 6.5–8.1)

## 2024-09-07 NOTE — ED Provider Notes (Signed)
 [place] EMERGENCY DEPARTMENT AT [facility] Provider Note   CSN: [id number] Arrival date & time: 09/07/24  1624     History {Add pertinent medical, surgical, social history, OB history to HPI:1} Chief Complaint  Patient presents with  . Medical Clearance    Christine Cantrell is a 78 y.o. female with PMH as listed below who presents  via [employer] custody under IVC order. Paperwork states to call DSS worker when the Pt arrived. Pt confused in Triage and unable to remember where she was when the [employer] Deputy picked her up. Per paperwork, Pt reportedly made SI comments and Pt has a memory impairment.  Patient has a history of breast cancer currently on anastrozole .  Also history of hypothyroidism, HTN, HLD, GERD/hiatal hernia, depression, LBBB.  Past Medical History:  Diagnosis Date  . Abnormal EKG saw dr hochrein 3 yrs ago for   hx of left bundle branch block on ekg since 2002  . Breast cancer (HCC)   . Depression   . Diverticulosis large intestine w/o perforation or abscess w/bleeding 2016  . Family history of lung cancer   . Family history of pancreatic cancer   . Family history of prostate cancer   . GERD (gastroesophageal reflux disease)    hx of  . High cholesterol   . History of hiatal hernia   . Hx of colonic polyps 2016   adenomatous  . Hypertension   . Hypothyroidism   . Pancreatic cyst   . Thyroid  disease    hypothyroidism       Home Medications Prior to Admission medications   Medication Sig Start Date End Date Taking? Authorizing Provider  anastrozole  (ARIMIDEX ) 1 MG tablet TAKE 1 TABLET BY MOUTH EVERY DAY 07/28/24   Odean Potts, MD  Coenzyme Q10-Vitamin E  (QUNOL ULTRA COQ10 PO) Take 1 capsule by mouth daily after lunch.     [provider]  Anselm Oil 500 MG CAPS Take 500 mg by mouth daily after lunch.     [provider]  levothyroxine  (SYNTHROID ) 75 MCG tablet Take 1 tablet (75 mcg total) by mouth daily before breakfast. 01/12/24    Jolinda Potter M, DO  lisinopril -hydrochlorothiazide  (ZESTORETIC ) 20-12.5 MG tablet Take 1 tablet by mouth daily. 01/12/24   Jolinda Potter HERO, DO  Magnesium  250 MG TABS Take 250 mg by mouth daily after lunch.     [provider]  memantine  (NAMENDA ) 5 MG tablet Take 1 tablet (5 mg total) by mouth 2 (two) times daily. For memory 01/12/24   Jolinda Potter HERO, DO  Multiple Vitamin (MULTIVITAMIN WITH MINERALS) TABS tablet Take 1 tablet by mouth daily after lunch. Women's One A Day Multivitamin     [provider]  simvastatin  (ZOCOR ) 20 MG tablet Take 1 tablet (20 mg total) by mouth daily. 01/12/24   Jolinda Potter HERO, DO      Allergies    Patient has no known allergies.    Review of Systems   Review of Systems A 10 point review of systems was performed and is negative unless otherwise reported in HPI.  Physical Exam Updated Vital Signs BP (!) 168/82 (BP Location: Right Arm)   Pulse 84   Temp 98.5 F (36.9 C) (Oral)   Resp 18   Ht 5' 6 (1.676 m)   Wt 71.3 kg   SpO2 96%   BMI 25.37 kg/m  Physical Exam General: Normal appearing {Desc; female/female:11659}, lying in bed.  HEENT: PERRLA, Sclera anicteric, MMM, trachea midline.  Cardiology: RRR, no murmurs/rubs/gallops. BL radial and DP pulses equal bilaterally.  Resp: Normal respiratory rate and effort. CTAB, no wheezes, rhonchi, crackles.  Abd: Soft, non-tender, non-distended. No rebound tenderness or guarding.  GU: Deferred. MSK: No peripheral edema or signs of trauma. Extremities without deformity or TTP. No cyanosis or clubbing. Skin: warm, dry. No rashes or lesions. Back: No CVA tenderness Neuro: A&Ox4, CNs II-XII grossly intact. MAEs. Sensation grossly intact.  Psych: Normal mood and affect.   ED Results / Procedures / Treatments   Labs (all labs ordered are listed, but only abnormal results are displayed) Labs Reviewed - No data to display  EKG None  Radiology No results  found.  Procedures Procedures  {Document cardiac monitor, telemetry assessment procedure when appropriate:1}  Medications Ordered in ED Medications - No data to display  ED Course/ Medical Decision Making/ A&P                          Medical Decision Making   This patient presents to the ED for concern of ***, this involves an extensive number of treatment options, and is a complaint that carries with it a high risk of complications and morbidity.  I considered the following differential and admission for this acute, potentially life threatening condition.   MDM:    ***     Labs: I Ordered, and personally interpreted labs.  The pertinent results include: Those listed above  Additional history obtained from chart review.  External records from outside source obtained and reviewed including ***  Cardiac Monitoring: .The patient was maintained on a cardiac monitor.  I personally viewed and interpreted the cardiac monitored which showed an underlying rhythm of: ***  Reevaluation: After the interventions noted above, I reevaluated the patient and found that they have :{resolved/improved/worsened:23923::improved}  Social Determinants of Health: .***  Disposition:  ***  Co morbidities that complicate the patient evaluation . Past Medical History:  Diagnosis Date  . Abnormal EKG saw dr hochrein 3 yrs ago for   hx of left bundle branch block on ekg since 2002  . Breast cancer (HCC)   . Depression   . Diverticulosis large intestine w/o perforation or abscess w/bleeding 2016  . Family history of lung cancer   . Family history of pancreatic cancer   . Family history of prostate cancer   . GERD (gastroesophageal reflux disease)    hx of  . High cholesterol   . History of hiatal hernia   . Hx of colonic polyps 2016   adenomatous  . Hypertension   . Hypothyroidism   . Pancreatic cyst   . Thyroid  disease    hypothyroidism     Medicines No orders of the defined  types were placed in this encounter.   I have reviewed the patients home medicines and have made adjustments as needed  Problem List / ED Course: Problem List Items Addressed This Visit   None        {Document critical care time when appropriate:1} {Document review of labs and clinical decision tools ie heart score, Chads2Vasc2 etc:1}  {Document your independent review of radiology images, and any outside records:1} {Document your discussion with family members, caretakers, and with consultants:1} {Document social determinants of health affecting pt's care:1} {Document your decision making why or why not admission, treatments were needed:1}  This note was created using dictation software, which may contain spelling or grammatical errors.

## 2024-09-07 NOTE — ED Notes (Signed)
 She is highly upset, does not want or feel that she needs to be  here, demanding her things and threatening to walk out

## 2024-09-07 NOTE — ED Notes (Addendum)
 ED Provider at bedside and pt allowed to call sister and tell her she will not be home tonight per EDP.

## 2024-09-07 NOTE — ED Triage Notes (Signed)
 Pt arrived via [employer] custody under IVC order. Paperwork states to call DSS worker when the Pt arrived. Pt confused in Triage and unable to remember where she was when the [employer] Deputy picked her up. Per paperwork, Pt reportedly made SI comments and Pt has a memory impairment.

## 2024-09-07 NOTE — ED Notes (Signed)
 Refusing to stay in her room had has bed bugs

## 2024-09-07 NOTE — ED Notes (Signed)
 Pt has brushed her own teeth and is now resting. She has voiced appreciation for staff's kindness and treatment of her.

## 2024-09-07 NOTE — ED Notes (Addendum)
 Pt belongings was placed in the family room on the brown table with labels 1 bag

## 2024-09-07 NOTE — ED Notes (Signed)
 Pt is very upset and agitated. Is trying to leave room. Verbally redirected. States this is all untruth and she needs her keys to drive home to [place] before dark. Attempting to verbally deescalate. EDP to come see.

## 2024-09-07 NOTE — ED Notes (Addendum)
 PT did contact her sister and brother. Obtaining EKG at this time. Pt is cooperative but tearful.

## 2024-09-08 DIAGNOSIS — F4320 Adjustment disorder, unspecified: Secondary | ICD-10-CM

## 2024-09-08 DIAGNOSIS — F4329 Adjustment disorder with other symptoms: Secondary | ICD-10-CM

## 2024-09-08 DIAGNOSIS — F0390 Unspecified dementia without behavioral disturbance: Secondary | ICD-10-CM

## 2024-09-08 DIAGNOSIS — R4189 Other symptoms and signs involving cognitive functions and awareness: Secondary | ICD-10-CM

## 2024-09-08 DIAGNOSIS — R419 Unspecified symptoms and signs involving cognitive functions and awareness: Secondary | ICD-10-CM

## 2024-09-08 HISTORY — DX: Unspecified dementia, unspecified severity, without behavioral disturbance, psychotic disturbance, mood disturbance, and anxiety: F03.90

## 2024-09-08 LAB — RAPID URINE DRUG SCREEN, HOSP PERFORMED
Amphetamines: NOT DETECTED
Barbiturates: NOT DETECTED
Benzodiazepines: NOT DETECTED
Cocaine: NOT DETECTED
Opiates: NOT DETECTED
Tetrahydrocannabinol: NOT DETECTED

## 2024-09-08 LAB — CBC WITH DIFFERENTIAL/PLATELET
Abs Immature Granulocytes: 0.01 K/uL (ref 0.00–0.07)
Basophils Absolute: 0 K/uL (ref 0.0–0.1)
Basophils Relative: 1 %
Eosinophils Absolute: 0.1 K/uL (ref 0.0–0.5)
Eosinophils Relative: 2 %
HCT: 40 % (ref 36.0–46.0)
Hemoglobin: 14.1 g/dL (ref 12.0–15.0)
Immature Granulocytes: 0 %
Lymphocytes Relative: 10 %
Lymphs Abs: 0.5 K/uL — ABNORMAL LOW (ref 0.7–4.0)
MCH: 33.4 pg (ref 26.0–34.0)
MCHC: 35.3 g/dL (ref 30.0–36.0)
MCV: 94.8 fL (ref 80.0–100.0)
Monocytes Absolute: 0.7 K/uL (ref 0.1–1.0)
Monocytes Relative: 16 %
Neutro Abs: 3.2 K/uL (ref 1.7–7.7)
Neutrophils Relative %: 71 %
Platelets: 161 K/uL (ref 150–400)
RBC: 4.22 MIL/uL (ref 3.87–5.11)
RDW: 12.8 % (ref 11.5–15.5)
WBC: 4.5 K/uL (ref 4.0–10.5)
nRBC: 0 % (ref 0.0–0.2)

## 2024-09-08 LAB — URINALYSIS, ROUTINE W REFLEX MICROSCOPIC
Bacteria, UA: NONE SEEN
Bilirubin Urine: NEGATIVE
Glucose, UA: NEGATIVE mg/dL
Hgb urine dipstick: NEGATIVE
Ketones, ur: NEGATIVE mg/dL
Nitrite: NEGATIVE
Protein, ur: NEGATIVE mg/dL
Specific Gravity, Urine: 1.012 (ref 1.005–1.030)
pH: 7 (ref 5.0–8.0)

## 2024-09-08 LAB — ETHANOL: Alcohol, Ethyl (B): <15 mg/dL (ref <15)

## 2024-09-08 LAB — SALICYLATE LEVEL: Salicylate Lvl: <7 mg/dL — ABNORMAL LOW (ref 7.0–30.0)

## 2024-09-08 LAB — ACETAMINOPHEN LEVEL: Acetaminophen (Tylenol), Serum: <10 ug/mL — ABNORMAL LOW (ref 10–30)

## 2024-09-08 NOTE — ED Notes (Signed)
 Transition of Care [facility]) - Emergency Department Mini Assessment   Patient Details  Name: Christine Cantrell MRN: [id number] Date of Birth: 28-Nov-1946  Transition of Care [facility]) CM/SW Contact:    [name], LCSWA Phone Number: 09/08/2024, 12:38 PM   Clinical Narrative:  CSW spoke with Oneil LG who was recently appointed to be patient LG and then Musu with DSS. Musu shared that she has been working on placement for patient since she lives alone and has not been taking care of herself. Musu shared that they IVC'd patient due to her wanting harm herself. Medicaid application has been started per DSS. [facility] is considering taken patient and [facility] has not yet responded back about accepting. Musu asked if CSW could start search as well. ICM will continue to follow.   ED Mini Assessment: What brought you to the Emergency Department? : IVC: Patient expressed wanting to harm-self  Barriers to Discharge: SNF Pending Medicaid, SNF Pending bed offer  Barrier interventions: find placement for LT care  Means of departure: Not know  Interventions which prevented an admission or readmission: SNF Placement    Patient Contact and Communications Key Contact 1: Mark and Musu   Spoke with: LG and DSS Contact Date: 09/08/24,   Contact time: 1214 Contact Phone Number: [phone] and [phone] Call outcome: Placement  Patient states their goals for this hospitalization and ongoing recovery are:: [place] CMS Medicare.gov Compare Post Acute Care list provided to:: Legal Guardian Choice offered to / list presented to : [facility] POA / Guardian  Admission diagnosis:  IVC Patient Active Problem List   Diagnosis Date Noted   Neurocognitive disorder (Dementia) 09/08/2024   Cerebral atrophy (HCC) 01/14/2023   Family history of prostate cancer    Family history of lung cancer    Family history of pancreatic cancer    Malignant neoplasm of upper-inner quadrant of right breast in female, estrogen  receptor positive (HCC) 07/06/2020   Postoperative hypothyroidism 09/17/2018   Pancreatic cyst 09/07/2018   Sarcoidosis of digestive system 06/25/2018   Elevated fasting blood sugar 09/15/2017   Healthcare maintenance 12/11/2015   HTN (hypertension) 09/07/2015   HLD (hyperlipidemia) 09/07/2015   Multiple thyroid  nodules 09/07/2015   PCP:  Jolinda Norene HERO, DO Pharmacy:   [phone] [place] [place] - [address] [place] [address] Phone: [phone] Fax: [phone]  [facility]) - [place], [place] - [address] [facility] [address] [place] [address] [place] [address] Phone: [phone] Fax: [phone]  [facility] - [place], [place] - [address] [place] [address] [place] [address] Phone: [phone] Fax: [phone]

## 2024-09-08 NOTE — ED Provider Notes (Signed)
 30 Day Note: To whom it May Concern:  Please be advised that the above name patient will require a short-term nursing home stay- anticipated 30 days or less rehabilitation and strengthening. The plan is for return home  [name] MD   [name] [name], MD 09/08/24 [phone]

## 2024-09-08 NOTE — ED Notes (Signed)
 Pts legal guardian Oneil morphies called for an update on patient status. LG made aware pt will have schedule tts @ 0900 then we will update him. Per Oneil pt was declared incompetent w/ the court yesterday and has a new diagnosis of dementia. Primary paramedic made aware also.

## 2024-09-08 NOTE — ED Notes (Signed)
 [facility] called [employer], pts legal guardian for collateral information. Mr. Dorette is the husband of pts niece. Per Mr. Morphies, pt had a hearing a week ago last Wednesday for competency and it was determined yesterday in court that pt is incompetent. Per pts LG, pt has cognitive impairment but has not been formally diagnosed by a neurologist as dementia. Pt was to follow up with a neurology appointment in July but did not.   Pts LG reports that pt has had mild cognitive impairment for a while with her condition getting progressively worse over the last few months. Pt is experiencing short term memory loss, sometimes not being aware of where she is or who she has just seen. Pts LG reports that on a recent trip where pt had flown to [place]  to visit a friend, pt had forgotten how she had got there and went outside to look for her car and luggage she thought was in the trunk of her car. Recently pt has been withdrawing $200-$300 every other day from an ATM to pay bills even though many of her bills are paid automatically. Pt has also locked herself out of her house several times.   Yesterday, pt was found walking on the highway looking for her car which had been taken by a family member for safety reasons. Up until yesterday, pt would not allow family to enter her residence. Family was able to gain access when pt was brought to the hospital. Pts family discovered that pt has been hoarding, with trash strewn around the home, and a serious bed bug infestation. An APS report has been filed due to pts unsafe home situation and a DSS worker has been assigned to the pt to look for a possible memory care placement. Pts LG and family feel that pt is not safe to return home. Pts DSS worker is Musu Jerrell [phone]).   Per pts LG, pt has a family history with both her mother and sister having had dementia. Pts LG and family are working with the DSS worker to find a memory care placement for pt. Per the LG  there are 2-3 possible placements.   Chesley Holt, [facility]  09/08/24

## 2024-09-08 NOTE — ED Provider Notes (Signed)
 Emergency Medicine Observation Re-evaluation Note  Christine Cantrell is a 78 y.o. female, seen on rounds today.  Pt initially presented to the ED for complaints of Medical Clearance Currently, the patient is sleeping.  Physical Exam  BP (!) 168/82 (BP Location: Right Arm)   Pulse 84   Temp 98.5 F (36.9 C) (Oral)   Resp 18   Ht 5' 6 (1.676 m)   Wt 71.3 kg   SpO2 96%   BMI 25.37 kg/m  Physical Exam General: No acute distress Cardiac: Well-perfused Lungs: Nonlabored Psych: Unable to assess  ED Course / MDM  EKG:EKG Interpretation Date/Time:  Wednesday September 07 2024 19:52:07 EDT Ventricular Rate:  72 PR Interval:  154 QRS Duration:  144 QT Interval:  446 QTC Calculation: 488 R Axis:   65  Text Interpretation: Normal sinus rhythm Left bundle branch block Abnormal ECG  Confirmed by Franklyn Gills [phone]) on 09/08/2024 12:58:21 AM  I have reviewed the labs performed to date as well as medications administered while in observation.  Recent changes in the last 24 hours include medical clearance.  Plan  Current plan is for psychiatric evaluation.  Patient was seen and evaluated by an psychiatry and felt to be psychiatrically cleared.  They feel this is related to dementia.  Have put a consult into social work to see if they can help with disposition.  They asked if physical therapy can also consult on her.    [name], MD 09/08/24 [phone]

## 2024-09-08 NOTE — BH Assessment (Addendum)
 Pt reached out to RN Nash at 04:33 regarding whether patient was able to have her teleassessment.  Clinician did not hear back from her so at 04:55 let RN know he was going on to another patient at [facility].  RN Nash said okay.

## 2024-09-08 NOTE — ED Notes (Signed)
 Pt escorted to family room by sitter for TTS

## 2024-09-08 NOTE — [place] FL2 (Signed)
 [place]  MEDICAID [id number] LEVEL OF CARE FORM     IDENTIFICATION  Patient Name: Christine Cantrell Birthdate: June 12, 1946 Sex: female Admission Date (Current Location): 09/07/2024  [facility] and [place] Number:  [employer] and Address:  [facility],  [address], [place] [address]      Provider Number: [id number]  Attending Physician Name and Address:  [name], MD  Relative Name and Phone Number:  Oneil Moan [facility]) [phone]    Current Level of Care: Hospital Recommended Level of Care: Skilled Nursing Facility Prior Approval Number:    Date Approved/Denied:   PASRR Number:    Discharge Plan: SNF    Current Diagnoses: Patient Active Problem List   Diagnosis Date Noted   Neurocognitive disorder (Dementia) 09/08/2024   Cerebral atrophy (HCC) 01/14/2023   Family history of prostate cancer    Family history of lung cancer    Family history of pancreatic cancer    Malignant neoplasm of upper-inner quadrant of right breast in female, estrogen receptor positive (HCC) 07/06/2020   Postoperative hypothyroidism 09/17/2018   Pancreatic cyst 09/07/2018   Sarcoidosis of digestive system 06/25/2018   Elevated fasting blood sugar 09/15/2017   Healthcare maintenance 12/11/2015   HTN (hypertension) 09/07/2015   HLD (hyperlipidemia) 09/07/2015   Multiple thyroid  nodules 09/07/2015    Orientation RESPIRATION BLADDER Height & Weight     Self  Normal Continent Weight: 157 lb 3 oz (71.3 kg) Height:  5' 6 (167.6 cm)  BEHAVIORAL SYMPTOMS/MOOD NEUROLOGICAL BOWEL NUTRITION STATUS      Continent Diet (See AVS)  AMBULATORY STATUS COMMUNICATION OF NEEDS Skin   Limited Assist Verbally Normal                       Personal Care Assistance Level of Assistance  Bathing, Feeding, Dressing Bathing Assistance: Limited assistance Feeding assistance: Independent Dressing Assistance: Limited assistance     Functional Limitations Info  Sight, Hearing,  Speech Sight Info: Adequate Hearing Info: Adequate      SPECIAL CARE FACTORS FREQUENCY                       Contractures Contractures Info: Not present    Additional Factors Info  Code Status, Allergies Code Status Info: FULL Allergies Info: NKA           Current Medications (09/08/2024):  This is the current hospital active medication list No current facility-administered medications for this encounter.   Current Outpatient Medications  Medication Sig Dispense Refill   anastrozole  (ARIMIDEX ) 1 MG tablet TAKE 1 TABLET BY MOUTH EVERY DAY 90 tablet 3   Coenzyme Q10-Vitamin E  (QUNOL ULTRA COQ10 PO) Take 1 capsule by mouth daily after lunch.      Krill Oil 500 MG CAPS Take 500 mg by mouth daily after lunch.      levothyroxine  (SYNTHROID ) 75 MCG tablet Take 1 tablet (75 mcg total) by mouth daily before breakfast. 90 tablet 3   lisinopril -hydrochlorothiazide  (ZESTORETIC ) 20-12.5 MG tablet Take 1 tablet by mouth daily. 90 tablet 3   Magnesium  250 MG TABS Take 250 mg by mouth daily after lunch.      memantine  (NAMENDA ) 5 MG tablet Take 1 tablet (5 mg total) by mouth 2 (two) times daily. For memory 180 tablet 3   Multiple Vitamin (MULTIVITAMIN WITH MINERALS) TABS tablet Take 1 tablet by mouth daily after lunch. Women's One A Day Multivitamin      simvastatin  (ZOCOR )  20 MG tablet Take 1 tablet (20 mg total) by mouth daily. 90 tablet 3     Discharge Medications: Please see after visit summary for a list of discharge medications.  Relevant Imaging Results:  Relevant Lab Results:   Additional Information SSN: [id number]  Christine Cantrell, [place]

## 2024-09-08 NOTE — ED Provider Notes (Signed)
 Dementia note: please be advised that the above-named patient has a primary diagnosis of dementia which supersedes any psychiatric diagnosis.  [name] MD   [name] [name], MD 09/08/24 [phone]

## 2024-09-08 NOTE — Progress Notes (Signed)
 PT Cancellation Note  Patient Details Name: Christine Cantrell MRN: [id number] DOB: 08/12/1946   Cancelled Treatment:    Reason Eval/Treat Not Completed: PT screened, no needs identified, will sign off. Patient at baseline ambulating with nursing staff supervising.   3:31 PM, 09/08/24 Lynwood Music, MPT Physical Therapist with [facility] [phone] office [phone] mobile phone

## 2024-09-08 NOTE — ED Notes (Signed)
 Pt's family & guardian is at visiting at this time.

## 2024-09-08 NOTE — ED Notes (Signed)
 Pt ambulated from room, redirected by sitter.

## 2024-09-08 NOTE — BH Assessment (Addendum)
 This patient has been referred to [facility] telecare for her teleassessment.  A coordinator with [facility] will reach out with a provider and time to see patient.

## 2024-09-08 NOTE — Consult Note (Signed)
 [facility] Psychiatric Consult Initial  Patient Name: .Christine Cantrell  MRN: [id number]  DOB: 08/05/1946  Consult Order details:  Orders (From admission, onward)     Start     Ordered   09/08/24 0122  CONSULT TO CALL ACT TEAM       Ordering Provider: Lorette Mayo, MD  Provider:  (Not yet assigned)  Question:  Reason for Consult?  Answer:  eval for suicidal comments   09/08/24 0121             Mode of Visit: Tele-visit Location of Provider [facility] Patient location: [facility] ED  ...SABRASABRAMode of Visit: Tele-visit Virtual Statement:TELE PSYCHIATRY ATTESTATION & CONSENT As the provider for this telehealth consult, I attest that I verified the patient's identity using two separate identifiers, introduced myself to the patient, provided my credentials, disclosed my location, and performed this encounter via a HIPAA-compliant, real-time, face-to-face, two-way, interactive audio and video platform and with the full consent and agreement of the patient (or guardian as applicable.) Patient physical location: *Telehealth provider physical location: home office in state of [place] .   Video start time: 11:20 am Video end time: 11:45 am    Psychiatry Consult Evaluation  Service Date: September 08, 2024 LOS:  LOS: 0 days  Chief Complaint I am not sure why I am here.  Primary Psychiatric Diagnoses  Neurocognitive Disorder (Dementia)    Assessment  Christine Cantrell is a 78 y.o. female admitted: Presented to the EDfor 09/07/2024  4:39 PM for altered mental status and agitations. She presented under involuntary commitment. . She carries no psychiatric diagnoses  and has a past medical history of  HTN, Hyperlipidemia, with no current complications.   Her current presentation of agitations and anger  is most consistent with reported symptoms. She meets criteria for memory care services  based on hx, current and reported symptoms.  She presents with no current outpatient psychotropic  medications,  and historically she has never been prescribed any. She denies hx of mental illness.   On initial examination, patient appears upset, denying need for psychiatric/mental health services. . Please see plan below for detailed recommendations.   Diagnoses:  Active Hospital problems: Active Problems:   Neurocognitive disorder (Dementia)    Plan   ## Psychiatric Medication Recommendations:  NA  ## Medical Decision Making Capacity: Patient has a guardian and has thus been adjudicated incompetent; please involve patients guardian in medical decision making  ## Further Work-up:  -- To be determined TOC consult for substance abuse resources -- most recent EKG: NA -- Pertinent labwork reviewed earlier this admission includes: ED labs reviewed    ## Disposition:-- There are no psychiatric contraindications to discharge at this time  ## Behavioral / Environmental: - No specific recommendations at this time.     ## Safety and Observation Level:  - Based on my clinical evaluation, I estimate the patient to be at low risk of self harm in the current setting. - At this time, we recommend  routine. This decision is based on my review of the chart including patient's history and current presentation, interview of the patient, mental status examination, and consideration of suicide risk including evaluating suicidal ideation, plan, intent, suicidal or self-harm behaviors, risk factors, and protective factors. This judgment is based on our ability to directly address suicide risk, implement suicide prevention strategies, and develop a safety plan while the patient is in the clinical setting. Please contact our team if there is a concern that risk  level has changed.  CSSR Risk Category:C-SSRS RISK CATEGORY: No Risk  Suicide Risk Assessment: Patient has following modifiable risk factors for suicide: triggering events, which we are addressing by recommending TOC consult  for memory  care/SNF. Patient has following non-modifiable or demographic risk factors for suicide: NA Patient has the following protective factors against suicide: Supportive family, Frustration tolerance, no history of suicide attempts, and no history of NSSIB  Thank you for this consult request. Recommendations have been communicated to the primary team.  We will recommend TO consult at this time.   Randall Bouquet, NP       History of Present Illness  Relevant Aspects of Hospital ED Course:  Admitted on 09/07/2024 for agitations.    Patient Report:  Per petitioner (DSS-APS), [name] is an 78 year-old who currently lives by herself and has cognitive impairment. She is very confused. [name] has made statements that she would be better off dead. [place] Also, respondent refuses to go to hospital. [name]'s personal hygiene possibly not that good-bathing, etc. Family member of respondent reached out to adult services to do welfare check. Possible danger to self.   Upon assessment: Patient is sitting in her room, sitter at bedside. She is receptive and cooperative. She is dressed in hospital scrubs with decent hygiene. Appears healthy. She maintains good eye contact. Alert and oriented x 4. Does not seem preoccupied or responding to internal stimuli. Thought process is coherent and goal-directed. Patient states I am not sure why I am here. Denies HI/HI/AVH. When asked about her safety at home and, in the community, patient states I feel safe, I feel healthy. I always walk long distance by myself, on [address], I am not sick, I feel strong. She reports that somebody committed her because they want to take her and finances away, to their possession, and that is one of her family members. Reports she does not want to specify who it is. Reports I just feel angry, upset. She reports she sometimes has depression but who wouldn't ?.  Reports she has  never been diagnosed with any mental illness, has never taken any psychotropics. Does not have any therapist or psychiatrist.  Patient reports she is able to care for herself and her brother and sister often check on her. When asked if provider should contact her brother for collateral information and safety plan, patient states not right now, there are some little issues going on between me and him.... She then mentions that brother is the one to pick her up upon discharge.   Patient denies memory problems. Denies substance use. Reports not being interested in outpatient services because I don't need any.  She denies acute medical concerns.  She reports that she eats and sleeps well.   Patient's legal guardian is contacted  by a team member and the following information is provided:   [facility] called Oneil Moan, pts legal guardian for collateral information. Mr. Moan is the husband of pts niece. Per Mr. Morphies, pt had a hearing a week ago last Wednesday for competency and it was determined yesterday in court that pt is incompetent. Per pts LG, pt has cognitive impairment but has not been formally diagnosed by a neurologist as dementia. Pt was to follow up with a neurology appointment in July but did not.    Pts LG reports that pt has had mild cognitive impairment for a while with her condition getting progressively worse over the last few  months. Pt is experiencing short term memory loss, sometimes not being aware of where she is or who she has just seen. Pts LG reports that on a recent trip where pt had flown to [place]  to visit a friend, pt had forgotten how she had got there and went outside to look for her car and luggage she thought was in the trunk of her car. Recently pt has been withdrawing $200-$300 every other day from an ATM to pay bills even though many of her bills are paid automatically. Pt has also locked herself out of her house several times.    Yesterday, pt was found walking  on the highway looking for her car which had been taken by a family member for safety reasons. Up until yesterday, pt would not allow family to enter her residence. Family was able to gain access when pt was brought to the hospital. Pts family discovered that pt has been hoarding, with trash strewn around the home, and a serious bed bug infestation. An APS report has been filed due to pts unsafe home situation and a DSS worker has been assigned to the pt to look for a possible memory care placement. Pts LG and family feel that pt is not safe to return home. Pts DSS worker is Musu Jerrell [phone]).    Per pts LG, pt has a family history with both her mother and sister having had dementia. Pts LG and family are working with the DSS worker to find a memory care placement for pt. Per the LG there are 2-3 possible placements.    Chesley Holt, [facility]   On 07/12/2024, patient was at Dr's office  (Dr Jolinda)  and it was mentioned that patient was experiencing a  MCI (mild cognitive impairment) with memory loss  along with Cerebral atrophy and neurology consult was recommended but has not been done yet.   Patient's legal guardian expresses safety concerns for patient is she is to return home. TOC  consult was ordered and case management in process.  Currently patient meets criteria for SNF/Memory care services. She is psychiatrically cleared.     Psych ROS:  Depression: Reported depression related to current stressors Anxiety:  Reported mild anxiety Mania (lifetime and current): NA Psychosis: (lifetime and current): NA  Collateral information:   Per triage note: Pt arrived via [facility] custody under IVC order. Paperwork states to call DSS worker when the Pt arrived. Pt confused in Triage and unable to remember where she was when the [facility] Deputy picked her up. Per paperwork, Pt reportedly made SI comments and Pt has a memory impairment.  Contacted : Patient's legal guardian Oneil Moan  [phone] and the above information was provided.   Review of Systems  Constitutional: Negative.   HENT: Negative.    Eyes: Negative.   Respiratory: Negative.    Cardiovascular: Negative.   Gastrointestinal: Negative.   Genitourinary: Negative.   Musculoskeletal: Negative.   Skin: Negative.   Neurological: Negative.   Endo/Heme/Allergies: Negative.   Psychiatric/Behavioral:  The patient is nervous/anxious.      Psychiatric and Social History  Psychiatric History:  Information collected from Patient, chart, legal guardian, Hotel manager.   Prev Dx/Sx: Mild Cognitive Impairment Current Psych Provider: NA Home Meds (current): NA Previous Med Trials: NA Therapy: NA  Prior Psych Hospitalization: NA  Prior Self Harm: NA Prior Violence: NA  Family Psych History: NA Family Hx suicide: NA  Social History:  Developmental Hx: NA Educational Hx: NA Occupational Hx: NA Legal Hx:  NA Living Situation: Lives alone Spiritual Hx: NA Access to weapons/lethal means: NA   Substance History Alcohol: Denies  Type of alcohol NA Last Drink NA Number of drinks per day NA History of alcohol withdrawal seizures NA History of DT's NA Tobacco: NA Illicit drugs: NA Prescription drug abuse: NA Rehab hx: NA  Exam Findings  Physical Exam:  Vital Signs:  Temp:  [98.1 F (36.7 C)-98.5 F (36.9 C)] 98.1 F (36.7 C) (09/11 0748) Pulse Rate:  [65-84] 65 (09/11 0748) Resp:  [16-18] 16 (09/11 0748) BP: (168-185)/(80-82) 185/80 (09/11 0748) SpO2:  [96 %-99 %] 99 % (09/11 0748) Weight:  [71.3 kg] 71.3 kg (09/10 1637) Blood pressure (!) 185/80, pulse 65, temperature 98.1 F (36.7 C), temperature source Oral, resp. rate 16, height 5' 6 (1.676 m), weight 71.3 kg, SpO2 99%. Body mass index is 25.37 kg/m.  Physical Exam Vitals and nursing note reviewed.  Constitutional:      Appearance: Normal appearance.  HENT:     Head: Normocephalic and atraumatic.     Right Ear: Tympanic membrane  normal.     Left Ear: Tympanic membrane normal.     Nose: Nose normal.  Eyes:     Pupils: Pupils are equal, round, and reactive to light.  Cardiovascular:     Pulses: Normal pulses.  Musculoskeletal:        General: Normal range of motion.     Cervical back: Normal range of motion and neck supple.  Neurological:     General: No focal deficit present.     Mental Status: She is alert and oriented to person, place, and time.  Psychiatric:        Thought Content: Thought content normal.     Mental Status Exam: General Appearance: Guarded  Orientation:  Full (Time, Place, and Person)  Memory:  Immediate;   Fair Recent;   Fair Remote;   Poor  Concentration:  Concentration: Fair and Attention Span: Fair  Recall:  Fair  Attention  Fair  Eye Contact:  Fair  Speech:  Clear and Coherent  Language:  Fair  Volume:  Normal  Mood: angry  Affect:  Constricted  Thought Process:  Coherent  Thought Content:  WDL  Suicidal Thoughts:  No  Homicidal Thoughts:  No  Judgement:  Poor  Insight:  Fair  Psychomotor Activity:  Normal  Akathisia:  NA  Fund of Knowledge:  Fair      Assets:  Manufacturing systems engineer Desire for Improvement Physical Health Social Support  Cognition:  WNL  ADL's:  Intact  AIMS (if indicated):        Other History   These have been pulled in through the EMR, reviewed, and updated if appropriate.  Family History:  The patient's family history includes Cancer in her nephew; Goiter in her mother and sister; Heart disease in her brother and brother; High Cholesterol in her father and mother; Hypertension in her brother, brother, father, and mother; Lung cancer in her maternal uncle; Lung disease in her brother; Pancreatic cancer in her nephew; Prostate cancer (age of onset: 92) in her brother.  Medical History: Past Medical History:  Diagnosis Date   Abnormal EKG saw dr hochrein 3 yrs ago for   hx of left bundle branch block on ekg since 2002   Breast cancer [facility])     Depression    Diverticulosis large intestine w/o perforation or abscess w/bleeding 2016   Family history of lung cancer    Family history of pancreatic cancer  Family history of prostate cancer    GERD (gastroesophageal reflux disease)    hx of   High cholesterol    History of hiatal hernia    Hx of colonic polyps 2016   adenomatous   Hypertension    Hypothyroidism    Pancreatic cyst    Thyroid  disease    hypothyroidism    Surgical History: Past Surgical History:  Procedure Laterality Date   ABDOMINAL HYSTERECTOMY  2002   complete for plyps   BREAST LUMPECTOMY WITH RADIOACTIVE SEED LOCALIZATION Right 08/22/2020   Procedure: RIGHT BREAST LUMPECTOMY WITH RADIOACTIVE SEED LOCALIZATION;  Surgeon: Aron Shoulders, MD;  Location: [facility] OR;  Service: General;  Laterality: Right;   ESOPHAGOGASTRODUODENOSCOPY N/A 09/30/2018   Procedure: ESOPHAGOGASTRODUODENOSCOPY (EGD);  Surgeon: Teressa Toribio SQUIBB, MD;  Location: [facility];  Service: Endoscopy;  Laterality: N/A;   EUS N/A 09/30/2018   Procedure: UPPER ENDOSCOPIC ULTRASOUND (EUS) RADIAL;  Surgeon: Teressa Toribio SQUIBB, MD;  Location: [facility];  Service: Endoscopy;  Laterality: N/A;   LIVER BIOPSY     normal result   thryoid needle biopsy  1990's   THYROIDECTOMY  1982     Medications:  No current facility-administered medications for this encounter.  Current Outpatient Medications:    anastrozole  (ARIMIDEX ) 1 MG tablet, TAKE 1 TABLET BY MOUTH EVERY DAY, Disp: 90 tablet, Rfl: 3   Coenzyme Q10-Vitamin E  (QUNOL ULTRA COQ10 PO), Take 1 capsule by mouth daily after lunch. , Disp: , Rfl:    Krill Oil 500 MG CAPS, Take 500 mg by mouth daily after lunch. , Disp: , Rfl:    levothyroxine  (SYNTHROID ) 75 MCG tablet, Take 1 tablet (75 mcg total) by mouth daily before breakfast., Disp: 90 tablet, Rfl: 3   lisinopril -hydrochlorothiazide  (ZESTORETIC ) 20-12.5 MG tablet, Take 1 tablet by mouth daily., Disp: 90 tablet, Rfl: 3   Magnesium  250 MG TABS,  Take 250 mg by mouth daily after lunch. , Disp: , Rfl:    memantine  (NAMENDA ) 5 MG tablet, Take 1 tablet (5 mg total) by mouth 2 (two) times daily. For memory, Disp: 180 tablet, Rfl: 3   Multiple Vitamin (MULTIVITAMIN WITH MINERALS) TABS tablet, Take 1 tablet by mouth daily after lunch. Women's One A Day Multivitamin , Disp: , Rfl:    simvastatin  (ZOCOR ) 20 MG tablet, Take 1 tablet (20 mg total) by mouth daily., Disp: 90 tablet, Rfl: 3  Allergies: No Known Allergies  Randall Bouquet, NP

## 2024-09-08 NOTE — Consult Note (Addendum)
 [facility] Consult Note  Patient Name: Christine Cantrell MRN: [id number] DOB: 11-Jan-1946 DATE OF Consult: 09/08/2024  PRIMARY PSYCHIATRIC DIAGNOSES  1.  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  Adjustment disorder with mood disturbance   RECOMMENDATIONS  Recommendations:  Medication recommendations: continue meds per OP regimen Non-Medication/therapeutic recommendations: safety planning and indications to return to ED discussed  Is inpatient psychiatric hospitalization recommended for this patient? No (Explain why): not an immediate threat to self or others at this time  Follow-Up Telepsychiatry C/L services: We will sign off for now. Please re-consult our service if needed for any concerning changes in the patient's condition, discharge planning, or questions.  Communication: Treatment team members (and family members if applicable) who were involved in treatment/care discussions and planning, and with whom we spoke or engaged with via secure text/chat, include the following: RN; EDMD  Thank you for involving us  in the care of this patient. If you have any additional questions or concerns, please call [phone] and ask for me or the provider on-call.   TELEPSYCHIATRY ATTESTATION & CONSENT  As the provider for this telehealth consult, I attest that I verified the patient's identity using two separate identifiers, introduced myself to the patient, provided my credentials, disclosed my location, and performed this encounter via a HIPAA-compliant, real-time, face-to-face, two-way, interactive audio and video platform and with the full consent and agreement of the patient (or guardian as applicable.)  Patient physical location: ED at [facility]. Telehealth provider physical location: home office in state of [place] .  Video start time: 800a CST (Central Time) Video end time: 813a CST (Central Time)  IDENTIFYING DATA  Christine Cantrell is a 78 y.o. year-old  female for whom a psychiatric consultation has been ordered by the primary provider. The patient was identified using two separate identifiers.  CHIEF COMPLAINT/REASON FOR CONSULT   Suicidal ideation   HISTORY OF PRESENT ILLNESS (HPI)  The patient is an 78 year old woman with a reported diagnosis of dementia presenting to ED on an IVC related to reported suicidal comments made to a family member. The patient was irritable at being in ED initially but has not had any grossly unsafe behaviors there. Psychiatry consulted given above.  The patient, when seen, is calm and cooperative; she is only partially oriented, reporting [facility] a [facility], but she states the year as 2012 and the month as May or April. She reports being here because of just that referring to episodes of confusion, which she asserts her family members were (ie, she appears to describe her family being confused, though she does so herself in a fairly confusing manner); her thought process is circumstantial. She denies any recall of an episode of suicidal statements or related behavior. She denies any lifetime history of SI, SIB, SA, IPH. She denies HI, AVH. She has no acute complaints.  Her MSE is consistent with neurocognitive disorder (ie, dementia). I do not believe she is a high risk to others insofar as suicide risk. At present I do not believe she requires Psychiatric hospitalization.  SABRA  PAST PSYCHIATRIC HISTORY   Although history is limited due to dementia, There is no known history of psychiatric admissions, SIB, suicide attempts Med history pertinent for memantine , likely for dementia  Otherwise as per HPI above.  PAST MEDICAL HISTORY  Past Medical History:  Diagnosis Date   Abnormal EKG saw dr hochrein 3 yrs ago for   hx of left bundle branch block on ekg since 2002  Breast cancer (HCC)    Depression    Diverticulosis large intestine w/o perforation or abscess w/bleeding 2016   Family history of  lung cancer    Family history of pancreatic cancer    Family history of prostate cancer    GERD (gastroesophageal reflux disease)    hx of   High cholesterol    History of hiatal hernia    Hx of colonic polyps 2016   adenomatous   Hypertension    Hypothyroidism    Pancreatic cyst    Thyroid  disease    hypothyroidism     HOME MEDICATIONS  PTA Medications  Medication Sig   Magnesium  250 MG TABS Take 250 mg by mouth daily after lunch.    Coenzyme Q10-Vitamin E  (QUNOL ULTRA COQ10 PO) Take 1 capsule by mouth daily after lunch.    Krill Oil 500 MG CAPS Take 500 mg by mouth daily after lunch.    Multiple Vitamin (MULTIVITAMIN WITH MINERALS) TABS tablet Take 1 tablet by mouth daily after lunch. Women's One A Day Multivitamin    levothyroxine  (SYNTHROID ) 75 MCG tablet Take 1 tablet (75 mcg total) by mouth daily before breakfast.   lisinopril -hydrochlorothiazide  (ZESTORETIC ) 20-12.5 MG tablet Take 1 tablet by mouth daily.   memantine  (NAMENDA ) 5 MG tablet Take 1 tablet (5 mg total) by mouth 2 (two) times daily. For memory   simvastatin  (ZOCOR ) 20 MG tablet Take 1 tablet (20 mg total) by mouth daily.   anastrozole  (ARIMIDEX ) 1 MG tablet TAKE 1 TABLET BY MOUTH EVERY DAY     ALLERGIES  No Known Allergies  SOCIAL & SUBSTANCE USE HISTORY  Social History   Socioeconomic History   Marital status: Single    Spouse name: Not on file   Number of children: 0   Years of education: 16   Highest education level: Bachelor's degree (e.g., BA, AB, BS)  Occupational History   Occupation: retired    Comment: Chief Strategy Officer  Tobacco Use   Smoking status: Never   Smokeless tobacco: Never  Vaping Use   Vaping status: Never Used  Substance and Sexual Activity   Alcohol use: Yes    Alcohol/week: 1.0 standard drink of alcohol    Types: 1 Glasses of wine per week    Comment: occasionally   Drug use: Never   Sexual activity: Not Currently    Partners: Male    Birth control/protection:  Surgical  Other Topics Concern   Not on file  Social History Narrative   Lives alone.  No children   Her brother and sister live nearby   Social Drivers of Health   Financial Resource Strain: Low Risk  (02/29/2024)   Overall Financial Resource Strain (CARDIA)    Difficulty of Paying Living Expenses: Not hard at all  Food Insecurity: No Food Insecurity (02/29/2024)   Hunger Vital Sign    Worried About Running Out of Food in the Last Year: Never true    Ran Out of Food in the Last Year: Never true  Transportation Needs: No Transportation Needs (02/29/2024)   PRAPARE - Administrator, Civil Service (Medical): No    Lack of Transportation (Non-Medical): No  Physical Activity: Sufficiently Active (02/29/2024)   Exercise Vital Sign    Days of Exercise per Week: 5 days    Minutes of Exercise per Session: 60 min  Stress: No Stress Concern Present (02/29/2024)   [employer] of Occupational Health - Occupational Stress Questionnaire    Feeling of Stress :  Only a little  Social Connections: Moderately Integrated (02/29/2024)   Social Connection and Isolation Panel    Frequency of Communication with Friends and Family: More than three times a week    Frequency of Social Gatherings with Friends and Family: Three times a week    Attends Religious Services: More than 4 times per year    Active Member of Clubs or Organizations: Yes    Attends Banker Meetings: Never    Marital Status: Never married   Social History   Tobacco Use  Smoking Status Never  Smokeless Tobacco Never   Social History   Substance and Sexual Activity  Alcohol Use Yes   Alcohol/week: 1.0 standard drink of alcohol   Types: 1 Glasses of wine per week   Comment: occasionally   Social History   Substance and Sexual Activity  Drug Use Never    Additional pertinent information: she reports living by herself with family nearby;  Not employed.  FAMILY HISTORY  Family History  Problem  Relation Age of Onset   High Cholesterol Mother    Hypertension Mother    Goiter Mother    High Cholesterol Father    Hypertension Father    Hypertension Brother    Lung disease Brother        chronic smoker   Heart disease Brother    Goiter Sister    Heart disease Brother    Hypertension Brother    Prostate cancer Brother 72   Lung cancer Maternal Uncle        smoker   Pancreatic cancer Nephew        dx. 40s/50s   Cancer Nephew        pancreatic vs. multiple myeloma; dx. 40s/50s   Colon cancer Neg Hx    Stomach cancer Neg Hx    Family Psychiatric History (if known):  none reported  MENTAL STATUS EXAM (MSE)  Mental Status Exam: General Appearance: hospital scrubs; hospital staff at bedside  Orientation:  partially oriented only to self, general place; disoriented to year, month  Memory:  impaired  Concentration:  fair  Recall:  impaired long- and short- term recall beyond superficial context  Attention  fair  Eye Contact:  good  Speech:  WNL  Language:  fluent English  Volume:  Normal  Mood: Fine  Affect:  mood-congruent  Thought Process:  linear at times though often circumstantial  Thought Content:  no overtly evident delusions  Suicidal Thoughts:  No  Homicidal Thoughts:  No  Judgement:  limited in context of dementia  Insight:  limited in context of dementia  Psychomotor Activity:  Normal  Akathisia:  none evident  Fund of Knowledge:  fair      VITALS  Blood pressure (!) 185/80, pulse 65, temperature 98.1 F (36.7 C), temperature source Oral, resp. rate 16, height 5' 6 (1.676 m), weight 71.3 kg, SpO2 99%.  LABS  Admission on 09/07/2024  Component Date Value Ref Range Status   Opiates 09/08/2024 NONE DETECTED  NONE DETECTED Final   Cocaine 09/08/2024 NONE DETECTED  NONE DETECTED Final   Benzodiazepines 09/08/2024 NONE DETECTED  NONE DETECTED Final   Amphetamines 09/08/2024 NONE DETECTED  NONE DETECTED Final   Tetrahydrocannabinol 09/08/2024 NONE  DETECTED  NONE DETECTED Final   Barbiturates 09/08/2024 NONE DETECTED  NONE DETECTED Final   Comment: (NOTE) DRUG SCREEN FOR MEDICAL PURPOSES ONLY.  IF CONFIRMATION IS NEEDED FOR ANY PURPOSE, NOTIFY LAB WITHIN 5 DAYS.  LOWEST DETECTABLE LIMITS FOR URINE  DRUG SCREEN Drug Class                     Cutoff (ng/mL) Amphetamine and metabolites    1000 Barbiturate and metabolites    200 Benzodiazepine                 200 Opiates and metabolites        300 Cocaine and metabolites        300 THC                            50 Performed at [facility], [address], [place], [place] [address]    Color, Urine 09/08/2024 YELLOW  YELLOW Final   APPearance 09/08/2024 CLEAR  CLEAR Final   Specific Gravity, Urine 09/08/2024 1.012  1.005 - 1.030 Final   pH 09/08/2024 7.0  5.0 - 8.0 Final   Glucose, UA 09/08/2024 NEGATIVE  NEGATIVE mg/dL Final   Hgb urine dipstick 09/08/2024 NEGATIVE  NEGATIVE Final   Bilirubin Urine 09/08/2024 NEGATIVE  NEGATIVE Final   Ketones, ur 09/08/2024 NEGATIVE  NEGATIVE mg/dL Final   Protein, ur 90/88/7974 NEGATIVE  NEGATIVE mg/dL Final   Nitrite 90/88/7974 NEGATIVE  NEGATIVE Final   Leukocytes,Ua 09/08/2024 TRACE (A)  NEGATIVE Final   RBC / HPF 09/08/2024 0-5  0 - 5 RBC/hpf Final   WBC, UA 09/08/2024 6-10  0 - 5 WBC/hpf Final   Bacteria, UA 09/08/2024 NONE SEEN  NONE SEEN Final   Squamous Epithelial / HPF 09/08/2024 0-5  0 - 5 /HPF Final   Performed at [facility], [address], [place], [place] [address]   Sodium 09/07/2024 136  135 - 145 mmol/L Final   Potassium 09/07/2024 3.6  3.5 - 5.1 mmol/L Final   Chloride 09/07/2024 99  98 - 111 mmol/L Final   CO2 09/07/2024 22  22 - 32 mmol/L Final   Glucose, Bld 09/07/2024 118 (H)  70 - 99 mg/dL Final   Glucose reference range applies only to samples taken after fasting for at least 8 hours.   BUN 09/07/2024 14  8 - 23 mg/dL Final   Creatinine, Ser 09/07/2024 0.79  0.44 - 1.00 mg/dL Final   Calcium 90/89/7974 9.0  8.9  - 10.3 mg/dL Final   Total Protein 90/89/7974 6.7  6.5 - 8.1 g/dL Final   Albumin 90/89/7974 3.6  3.5 - 5.0 g/dL Final   AST 90/89/7974 23  15 - 41 U/L Final   ALT 09/07/2024 21  0 - 44 U/L Final   Alkaline Phosphatase 09/07/2024 26 (L)  38 - 126 U/L Final   Total Bilirubin 09/07/2024 1.2  0.0 - 1.2 mg/dL Final   GFR, Estimated 09/07/2024 >60  >60 mL/min Final   Comment: (NOTE) Calculated using the CKD-EPI Creatinine Equation (2021)    Anion gap 09/07/2024 15  5 - 15 Final   Performed at [facility], [address] [place], [place] [address]   WBC 09/07/2024 4.5  4.0 - 10.5 K/uL Final   RBC 09/07/2024 4.22  3.87 - 5.11 MIL/uL Final   Hemoglobin 09/07/2024 14.1  12.0 - 15.0 g/dL Final   HCT 90/89/7974 40.0  36.0 - 46.0 % Final   MCV 09/07/2024 94.8  80.0 - 100.0 fL Final   MCH 09/07/2024 33.4  26.0 - 34.0 pg Final   MCHC 09/07/2024 35.3  30.0 - 36.0 g/dL Final   RDW 90/89/7974 12.8  11.5 - 15.5 %  Final   Platelets 09/07/2024 161  150 - 400 K/uL Final   nRBC 09/07/2024 0.0  0.0 - 0.2 % Final   Neutrophils Relative % 09/07/2024 71  % Final   Neutro Abs 09/07/2024 3.2  1.7 - 7.7 K/uL Final   Lymphocytes Relative 09/07/2024 10  % Final   Lymphs Abs 09/07/2024 0.5 (L)  0.7 - 4.0 K/uL Final   Monocytes Relative 09/07/2024 16  % Final   Monocytes Absolute 09/07/2024 0.7  0.1 - 1.0 K/uL Final   Eosinophils Relative 09/07/2024 2  % Final   Eosinophils Absolute 09/07/2024 0.1  0.0 - 0.5 K/uL Final   Basophils Relative 09/07/2024 1  % Final   Basophils Absolute 09/07/2024 0.0  0.0 - 0.1 K/uL Final   Immature Granulocytes 09/07/2024 0  % Final   Abs Immature Granulocytes 09/07/2024 0.01  0.00 - 0.07 K/uL Final   Performed at [facility], [address] [place], [place] [address]   Alcohol, Ethyl (B) 09/07/2024 <15  <15 mg/dL Final   Comment: (NOTE) For medical purposes only. Performed at [facility], [address], [place], [place] [address]    Salicylate Lvl 09/07/2024 <7.0 (L)  7.0 -  30.0 mg/dL Final   Performed at [facility], [address], [place], [place] [address]   Acetaminophen  (Tylenol ), Serum 09/07/2024 <10 (L)  10 - 30 ug/mL Final   Comment: (NOTE) Therapeutic concentrations vary significantly. A range of 10-30 ug/mL  may be an effective concentration for many patients. However, some  are best treated at concentrations outside of this range. Acetaminophen  concentrations >150 ug/mL at 4 hours after ingestion  and >50 ug/mL at 12 hours after ingestion are often associated with  toxic reactions.  Performed at [facility], [address], [place], [place] [address]     PSYCHIATRIC REVIEW OF SYSTEMS (ROS)  ROS: Notable for the following relevant positive findings: ROS limited due to dementia  + difficulty concentrating  Additional findings:      Musculoskeletal: No abnormal movements observed      Gait & Station: Laying/Sitting      Pain Screening: Denies   RISK FORMULATION/ASSESSMENT  Is the patient experiencing any suicidal or homicidal ideations: No  Protective factors considered for safety management: denial of SI; family  Risk factors/concerns considered for safety management:  Physical illness/chronic pain Age over 70  Is there a safety management plan with the patient and treatment team to minimize risk factors and promote protective factors: Yes           Explain: continued long-term follow-up Is crisis care placement or psychiatric hospitalization recommended: No     Based on my current evaluation and risk assessment, patient is determined at this time to be at:  Low risk  *RISK ASSESSMENT Risk assessment is a dynamic process; it is possible that this patient's condition, and risk level, may change. This should be re-evaluated and managed over time as appropriate. Please re-consult psychiatric consult services if additional assistance is needed in terms of risk assessment and management. If your team decides to discharge this patient,  please advise the patient how to best access emergency psychiatric services, or to call 911, if their condition worsens or they feel unsafe in any way.   Nancyann LITTIE Alert, MD Telepsychiatry Consult Services

## 2024-09-08 NOTE — ED Provider Notes (Signed)
 12:58 AM Assumed care from Dr. Franklyn, please see their note for full history, physical and decision making until this point. In brief this is a 78 y.o. year old female who presented to the ED tonight with Medical Clearance     Pending UA. IVC'ed for suicidal comments to a family member. First exam. Pending TTS and dispo.  Calm, collected throughout the night. Still pending TTS recommendations.   Labs, studies and imaging reviewed by myself and considered in medical decision making if ordered. Imaging interpreted by radiology.  Labs Reviewed  COMPREHENSIVE METABOLIC PANEL WITH GFR - Abnormal; Notable for the following components:      Result Value   Glucose, Bld 118 (*)    Alkaline Phosphatase 26 (*)    All other components within normal limits  CBC WITH DIFFERENTIAL/PLATELET - Abnormal; Notable for the following components:   Lymphs Abs 0.5 (*)    All other components within normal limits  SALICYLATE LEVEL - Abnormal; Notable for the following components:   Salicylate Lvl <7.0 (*)    All other components within normal limits  ACETAMINOPHEN  LEVEL - Abnormal; Notable for the following components:   Acetaminophen  (Tylenol ), Serum <10 (*)    All other components within normal limits  ETHANOL  RAPID URINE DRUG SCREEN, HOSP PERFORMED  URINALYSIS, ROUTINE W REFLEX MICROSCOPIC    No orders to display    No follow-ups on file.    Khup Sapia, Selinda, MD 09/08/24 [phone]

## 2024-09-09 NOTE — ED Notes (Signed)
 Patient still has no bed offers. CSW reached out to [facility] and had to leave a confidential HIPAA vm. CSW spoke with Marval with [facility] and provided her with an update on patient and her payor source. Marval stated that she will run the information by her business office and give DSS a call. CSW afterwards, called [facility] and spoke with Asberry in admission who stated that she will share referral with her team to see what they think and reach out to DSS. ICM will continue to follow.

## 2024-09-09 NOTE — ED Provider Notes (Signed)
 Emergency Medicine Observation Re-evaluation Note  Christine Cantrell is a 78 y.o. female, seen on rounds today.  Pt initially presented to the ED for complaints of Medical Clearance Currently, the patient is sleeping.  Physical Exam  BP (!) 144/78   Pulse 77   Temp 98 F (36.7 C) (Oral)   Resp 16   Ht 5' 6 (1.676 m)   Wt 71.3 kg   SpO2 99%   BMI 25.37 kg/m  Physical Exam General: No acute distress Cardiac: Well-perfused Lungs: Nonlabored Psych: Unable to assess  ED Course / MDM  EKG:EKG Interpretation Date/Time:  Wednesday September 07 2024 19:52:07 EDT Ventricular Rate:  72 PR Interval:  154 QRS Duration:  144 QT Interval:  446 QTC Calculation: 488 R Axis:   65  Text Interpretation: Normal sinus rhythm Left bundle branch block Abnormal ECG  Confirmed by Franklyn Gills [phone]) on 09/08/2024 12:58:21 AM  I have reviewed the labs performed to date as well as medications administered while in observation.  Recent changes in the last 24 hours include psychiatric clearance.  Plan  Current plan is for social work working on placement.    Christine Ozell [name], MD 09/09/24 [phone]

## 2024-09-09 NOTE — ED Notes (Signed)
 Oneil (legal guardian) called me and I asked could he give me an idea of the entire situation because the pt appears to be alert and is asking questions about how she was brought in to the ED. He stated that the patient was deemed incompetent on Wednesday at a hearing at the courthouse. He also stated that it was he and his wife that were the ones who found the pt wandering along the [place] and that is when he contacted her social worker because she was looking for her car. According to [facility], he then contacted her social worker and was told that she would be picked up within 24 hours and brought to hospital. He also stated,  I'm not trying to convince you Angie of anything, but she has gotten well known by the bailiff at the courthouse because she goes out there to ask for assistance with utility bills.

## 2024-09-09 NOTE — ED Notes (Signed)
 Called pt's social worker and left message to return call because pt appears to be A&O and is questioning why she is here. She states that her niece and her husband want everything she has.

## 2024-09-09 NOTE — ED Notes (Signed)
 Pt family came to visit. Stayed for approximately 15 minutes and left with no problems.

## 2024-09-09 NOTE — ED Notes (Signed)
 Pt keeps coming out of her room asking about why she is here and who put her here. Staff continuously redirecting pt back to room. She also keeps asking about her pocketbook even after she's been told repeatedly that her belongings are in locker #1 and there is no pocket book with her belongings.

## 2024-09-09 NOTE — ED Notes (Signed)
 This Clinical research associate reviewed patient chart, no behaviors are being displayed This Clinical research associate spoke with Oneil- LG early this morning. Oneil raised questions about patient being signed out by other family members, what staff shared with him about patient is alert and oriented, and next steps with placement. Writer assured to LG that no one will be able to take patient out without him being notified, staff is aware that patient will have sundowning episodes- part of her diagnosis, and once placement is found we will assist with transportation.     Addendum 10:35 AM  This Clinical research associate after speaking with Oneil went to visit patient. Patient was highly confused , unsure on how she got here, thought she was at home, kept asking what time it is , and POA( Plan of action) . Patient is aware that Oneil was appointed as her LG and she stated that she wants her brother to be the LG,  I have nothing against Mark at all . Writer advised her to discuss POA (plan of action ) and guardian options with Oneil when he comes later to visit. Writer did share that DSS is also involved in her case . Patient believes family is trying to take her home and land and writer expressed that family is concerned about her well being and mental state. Patient stated ,  I would be to. Before leaving patient asked for writer name and number for her to have. ICM will continue to follow.

## 2024-09-09 NOTE — ED Notes (Signed)
 Pt called sister via department phone. Monitored by sitter.

## 2024-09-10 MED ORDER — KRILL OIL 500 MG PO CAPS: 500.0000 mg | ORAL_CAPSULE | Freq: Every day | ORAL | Status: DC

## 2024-09-10 MED ORDER — SIMVASTATIN 10 MG PO TABS
20.0000 mg | ORAL_TABLET | Freq: Every day | ORAL | Status: DC
  Administered 2024-09-10 – 2024-09-23 (×14): 20 mg via ORAL
  Filled 2024-09-10 (×14): qty 2

## 2024-09-10 MED ORDER — ADULT MULTIVITAMIN W/MINERALS CH
1.0000 | ORAL_TABLET | Freq: Every day | ORAL | Status: DC
Start: 2024-09-10 — End: 2024-09-23
  Administered 2024-09-11 – 2024-09-22 (×11): 1 via ORAL
  Filled 2024-09-10 (×11): qty 1

## 2024-09-10 MED ORDER — MAGNESIUM 250 MG PO TABS: 250.0000 mg | ORAL_TABLET | Freq: Every day | ORAL | Status: DC

## 2024-09-10 MED ORDER — LEVOTHYROXINE SODIUM 50 MCG PO TABS
75.0000 ug | ORAL_TABLET | Freq: Every day | ORAL | Status: DC
Start: 2024-09-10 — End: 2024-09-10

## 2024-09-10 MED ORDER — MEMANTINE HCL 10 MG PO TABS
5.0000 mg | ORAL_TABLET | Freq: Two times a day (BID) | ORAL | Status: DC
Start: 2024-09-10 — End: 2024-09-23
  Administered 2024-09-10 – 2024-09-23 (×23): 5 mg via ORAL
  Filled 2024-09-10 (×26): qty 1

## 2024-09-10 MED ORDER — LORAZEPAM 1 MG PO TABS
1.0000 mg | ORAL_TABLET | Freq: Once | ORAL | Status: AC
  Administered 2024-09-10: 1 mg via ORAL
  Filled 2024-09-10: qty 1

## 2024-09-10 MED ORDER — COENZYME Q10-VITAMIN E 100-150 MG-UNIT PO CAPS: 1.0000 | ORAL_CAPSULE | Freq: Every day | ORAL | Status: DC

## 2024-09-10 MED ORDER — ANASTROZOLE 1 MG PO TABS
1.0000 mg | ORAL_TABLET | Freq: Every day | ORAL | Status: DC
  Administered 2024-09-10 – 2024-09-23 (×14): 1 mg via ORAL
  Filled 2024-09-10 (×14): qty 1

## 2024-09-10 MED ORDER — LISINOPRIL 10 MG PO TABS
20.0000 mg | ORAL_TABLET | Freq: Every day | ORAL | Status: DC
  Administered 2024-09-10 – 2024-09-23 (×14): 20 mg via ORAL
  Filled 2024-09-10 (×14): qty 2

## 2024-09-10 MED ORDER — LEVOTHYROXINE SODIUM 50 MCG PO TABS
75.0000 ug | ORAL_TABLET | Freq: Every day | ORAL | Status: DC
  Administered 2024-09-10 – 2024-09-23 (×12): 75 ug via ORAL
  Filled 2024-09-10 (×13): qty 2

## 2024-09-10 MED ORDER — MAGNESIUM OXIDE -MG SUPPLEMENT 400 (240 MG) MG PO TABS
200.0000 mg | ORAL_TABLET | Freq: Every day | ORAL | Status: DC
  Administered 2024-09-10 – 2024-09-23 (×14): 200 mg via ORAL
  Filled 2024-09-10 (×14): qty 1

## 2024-09-10 MED ORDER — LISINOPRIL-HYDROCHLOROTHIAZIDE 20-12.5 MG PO TABS
1.0000 | ORAL_TABLET | Freq: Every day | ORAL | Status: DC
Start: 2024-09-10 — End: 2024-09-10

## 2024-09-10 MED ORDER — HYDROCHLOROTHIAZIDE 12.5 MG PO TABS
12.5000 mg | ORAL_TABLET | Freq: Every day | ORAL | Status: DC
  Administered 2024-09-10 – 2024-09-23 (×14): 12.5 mg via ORAL
  Filled 2024-09-10 (×14): qty 1

## 2024-09-10 NOTE — ED Notes (Signed)
 Pt care taken, resting, no complaints at this time

## 2024-09-10 NOTE — ED Notes (Addendum)
 1116 - Round check is complete at this time. Pt denies needing anything like food, drink or bathroom. Pt has no s/s of distress.  1206 - Round check is complete at this time. Pt denies needing anything like food, drink or bathroom. Pt has no s/s of distress. Pt is talking to the sitters out in the hallway.  1316 - Round check is complete at this time. Pt came out of room why is she here and what is she waiting on. Pt was reoriented to the situation. Pt was updated on her status of waiting on a placement to a facility. It is the weekend and social work is not available on the weekend. Pt went back to her room calmly.   1321 - Pt requested a phone. Pt was given a phone with the supervision of the sitter and this nurse  1402 - Round check is complete at this time. Pt denies needing anything like food, drink or bathroom. Pt has no s/s of distress.  1505 - Round check is complete at this time. Pt denies needing anything like food, drink or bathroom. Pt has no s/s of distress.  1618 - Pt demanded to speak to a police officer. Security has told the pt repeatedly that she is here involuntarily and has explained thoroughly what that means. Pt keeps saying she is going to sue staff and the hospital.   1700 - Round check is complete at this time. Pt denies needing anything like food, drink or bathroom. Pt has no s/s of distress. Pt received a dinner tray  1800 - Round check is complete at this time. Pt denies needing anything like food, drink or bathroom. Pt has no s/s of distress.  1900 - Round check is complete at this time. Pt denies needing anything like food, drink or bathroom. Pt has no s/s of distress. Pt did ask to leave and was reoriented that she is IVC and she cannot leave.   2009 - Pt called sister over the phone under staff supervision. After phone call, pt went to the bathroom to brush her teeth.   2100 - Round check is complete at this time. Pt denies needing anything like food, drink or  bathroom. Pt has no s/s of distress.  2200 - Round check is complete at this time. Pt denies needing anything like food, drink or bathroom. Pt has no s/s of distress. Pt placed her shoes and asked the sitter if she can pay a bill. She walked back in her room to lay down.   2300 - Round check is complete at this time. Pt denies needing anything like food, drink or bathroom. Pt has no s/s of distress.

## 2024-09-10 NOTE — ED Provider Notes (Signed)
 Emergency Medicine Observation Re-evaluation Note  Christine Cantrell is a 78 y.o. female, seen on rounds today.  Pt initially presented to the ED for complaints of Medical Clearance Currently, the patient is watching TV. Denies complaints .  Physical Exam  BP 136/68 (BP Location: Left Arm)   Pulse 78   Temp 98 F (36.7 C) (Oral)   Resp 20   Ht 5' 6 (1.676 m)   Wt 71.3 kg   SpO2 99%   BMI 25.37 kg/m  Physical Exam General: NAD Cardiac: rrr Lungs: equal chest rise  Psych: calm  ED Course / MDM  EKG:EKG Interpretation Date/Time:  Wednesday September 07 2024 19:52:07 EDT Ventricular Rate:  72 PR Interval:  154 QRS Duration:  144 QT Interval:  446 QTC Calculation: 488 R Axis:   65  Text Interpretation: Normal sinus rhythm Left bundle branch block Abnormal ECG  Confirmed by Franklyn Gills [phone]) on 09/08/2024 12:58:21 AM  I have reviewed the labs performed to date as well as medications administered while in observation.  Recent changes in the last 24 hours include ordered home meds.  Plan  Current plan is for placement.    Francesca Elsie CROME, MD 09/10/24 [phone]

## 2024-09-11 NOTE — ED Notes (Signed)
 Pt walked back to room after taking medication

## 2024-09-11 NOTE — ED Notes (Signed)
 Pt asking why she is here- Tully, RN charge nurse talking with pt about why she is here.

## 2024-09-11 NOTE — ED Notes (Signed)
 Legal Guardian, Oneil Moan, request to have encounter marked confidential and to have patient contacts edited to show himself and Roselie Pa as the only people with access to the patient.  Legal guardian requests that no information be given to anyone except Roselie and himself.   Legal guardian requests that patient have no visitors other than himself and those that accompany him.  Legal guardian requests password protection for patient encounter and information  Legal Guardian is the password. DO NOT GIVE INFORMATION TO ANYONE WHO DOES NOT KNOW THIS PASSWORD.

## 2024-09-11 NOTE — ED Notes (Addendum)
 SABRA

## 2024-09-11 NOTE — ED Notes (Addendum)
 Pt made phone call to friend (girl)- talked about 10 minutes

## 2024-09-11 NOTE — ED Provider Notes (Signed)
 Emergency Medicine Observation Re-evaluation Note  Christine Cantrell is a 78 y.o. female, seen on rounds today.  Pt initially presented to the ED for complaints of Medical Clearance Currently, the patient is sleeping.  Physical Exam  BP 138/68 (BP Location: Right Arm)   Pulse 72   Temp 98.1 F (36.7 C) (Oral)   Resp 18   Ht 5' 6 (1.676 m)   Wt 71.3 kg   SpO2 98%   BMI 25.37 kg/m  Physical Exam General: No acute distress Cardiac: Well-perfused Lungs: Nonlabored Psych: Unable to assess  ED Course / MDM  EKG:EKG Interpretation Date/Time:  Wednesday September 07 2024 19:52:07 EDT Ventricular Rate:  72 PR Interval:  154 QRS Duration:  144 QT Interval:  446 QTC Calculation: 488 R Axis:   65  Text Interpretation: Normal sinus rhythm Left bundle branch block Abnormal ECG  Confirmed by Franklyn Gills [phone]) on 09/08/2024 12:58:21 AM  I have reviewed the labs performed to date as well as medications administered while in observation.  Recent changes in the last 24 hours include social work pursuing placement.  Plan  Current plan is for placement.    [name], MD 09/11/24 [phone]

## 2024-09-11 NOTE — ED Notes (Signed)
 Family member, Glenys, called. Patient agreed to speak with her. No information was given to the family member, as she is not listed in the contacts for the patient. Tanya requests to be added to contacts. Will speak with LG later about that.

## 2024-09-11 NOTE — ED Notes (Signed)
 LG Mark called and was updated about the patient's status overnight. He reported that he would be here to visit patient after 10am.

## 2024-09-12 NOTE — ED Notes (Signed)
 Patient ambulated to the restroom

## 2024-09-12 NOTE — ED Notes (Signed)
 Pt allowed 1 phone call to Roselie Pa, password obtained, pt calm and cooperative while on call

## 2024-09-12 NOTE — ED Notes (Signed)
 Pt came into hallway asking why she is here and that she hasn't done anything and is being held against her will this sitter told pt that I can get the nurse to come into her room and talk to her. Pt said  I need to talk to someone higher up than a nurse this sitter told pt that the doctor is busy and that we wont know what time he will be in to see her. Pt said okay and went back into her room.

## 2024-09-12 NOTE — ED Notes (Addendum)
 Patient making a phone call. Patient spoke to someone, and stated come and get me. This nurse informed the patient that she is not able to leave, and we have no placement at this time. Patient wanted to know when she will be leaving, Informed patient that we do not have placement at this time. Patient the proceeds to inform this nurse that I have a friend at [employer] paper who is going to [employer] at letter Patient then returned to her room.

## 2024-09-12 NOTE — ED Provider Notes (Signed)
 Emergency Medicine Observation Re-evaluation Note  Christine Cantrell is a 78 y.o. female, seen on rounds today.  Pt initially presented to the ED for complaints of Medical Clearance Currently, the patient is asleep.  Patient has no neurocognitive decline and had come into the emergency room with SI.  Patient is currently pending placement.  Physical Exam  BP (!) 178/89 (BP Location: Left Arm)   Pulse 96   Temp 98.1 F (36.7 C) (Oral)   Resp 18   Ht 5' 6 (1.676 m)   Wt 71.3 kg   SpO2 99%   BMI 25.37 kg/m  Physical Exam General: No acute distress  ED Course / MDM  EKG:EKG Interpretation Date/Time:  Wednesday September 07 2024 19:52:07 EDT Ventricular Rate:  72 PR Interval:  154 QRS Duration:  144 QT Interval:  446 QTC Calculation: 488 R Axis:   65  Text Interpretation: Normal sinus rhythm Left bundle branch block Abnormal ECG  Confirmed by Franklyn Gills [phone]) on 09/08/2024 12:58:21 AM  I have reviewed the labs performed to date as well as medications administered while in observation.  Recent changes in the last 24 hours include -no new changes over the weekend.  Plan  Current plan is for holding patient for placement.    Charlyn Sora, MD 09/12/24 [phone]

## 2024-09-12 NOTE — ED Notes (Signed)
 Visitors off unit at this time, pt calm and cooperative

## 2024-09-12 NOTE — ED Notes (Signed)
 CSW reviewed patient chart. No updates regarding bed offers. Patient makes too much money to ALF. CSW spoke with Musu - DSS to see if anyone reached out to her from Friday. Musu said no. CSW did advise her to follow up since the the two facilities ( [facility] and [facility] ) both stated that they would call her regarding  medicaid application portion. Musu reached out stating that [facility] stated that they will get back with her and she left a message for [facility] to call her back. ICM will continue to follow

## 2024-09-12 NOTE — TOC CM/SW Note (Signed)
 CSW updated by Musu with [facility] DSS that she needs an updated copy of pts Fl2. CSW sent via fax for Musu to review. TOC to follow.

## 2024-09-12 NOTE — ED Notes (Addendum)
 Patient making second phone call. Requesting for family to come pick her up. Informed patient that she is not discharged and is IVC. Patient told family they are hold me against my will. This Nurse informed patient again she is IVC and not discharged, therefore not able to go.

## 2024-09-12 NOTE — ED Notes (Signed)
 Patient Legal Guardian Christine Cantrell call. LG given update on patient status.

## 2024-09-12 NOTE — ED Notes (Addendum)
 This nurse asked to speak with patient. Patient was sitting when this nurse entered the room. Patient stands and walks toward nurse and asks this nurse why am I being held against my will. This nurse informed patient that she has IVC papers and is currently awaiting placement due to her having episodes of alter mental presentation due to her hx of dementia. Patient then stated I want to see and attorney, and talk to someone about making someone else's my legal guardian other than mark. Then Patient requested to see the Doctor, informed patient that the provider is with another patient at this time and we will be bring medications. Patient took medications.

## 2024-09-13 NOTE — ED Notes (Addendum)
 CSW reviewed patient chart and read that patient has been making calls telling family/friends to come pick her up. Patient is redirectable and understands- even after repeating numerous times that she that no one can come and pick her up because CSW is looking for placement and she is IVC'd. CSW spoke with [facility] DSS this morning to make sure she received the fax from yesterday. Also shared with Musu that ICM leadership has shared that we can do an LOG ( Letter Of Guarantee ) to the facility if the pending medicaid was a barrier. CSW will revisit facilities that are considering in [place] to see if they are willing to offer a bed. CSW will continue to follow.    Addendum 12:38 PM  CSW reached out to [employer] with [facility] and [place] with [employer] and [place] . Tiffany shared that they are no longer accepting LOG's. Tammy shared that [facility] does not have any beds and that she will check in [facility] regarding accepting LOG . CSW shared with Musu that [facility] declined. CSW sent patient referral out to [facility] with a note attached sharing that patient needing LT placement, unable to use her [facility] medicare- makes too much for ALF, can do an LOG- if necessary, since patient is medicaid pending. CSW will continue to follow.

## 2024-09-13 NOTE — ED Notes (Signed)
 Pt ambulated to restroom independently and when stepping out of her room, asked for her car keys so she could go to church. Pt redirected at thsi time.  Pts LG Mark present to visit with the Pt at this time.

## 2024-09-13 NOTE — ED Notes (Addendum)
 Pts LG Mark approached Nurse Station to discuss the Pt having visitors. Per Oneil, continue to utilize password via telephone inquiries, and also the Pt may have a niece Tonya visit with her as well as other family members during appropriate visitation times.  However, if visitors get the Pt agitated, they are to be asked to leave to premise, per LG Mark and LG Mark to be notified.

## 2024-09-13 NOTE — ED Notes (Signed)
 Pt provided one phone call at this time. Pt reports she is calling her LG Mark.

## 2024-09-13 NOTE — ED Provider Notes (Signed)
 Emergency Medicine Observation Re-evaluation Note  Christine Cantrell is a 78 y.o. female, seen on rounds today.  Pt initially presented to the ED for complaints of Medical Clearance Currently, the patient is asleep.  Pt has been in the ED on 9/10 for agitation and possible SI.  She has been psych cleared and SW has been trying to find a facility for her.    Physical Exam  BP (!) 171/85 (BP Location: Left Arm)   Pulse 83   Temp 98.1 F (36.7 C) (Oral)   Resp 18   Ht 5' 6 (1.676 m)   Wt 71.3 kg   SpO2 96%   BMI 25.37 kg/m  Physical Exam General: asleep Cardiac: rr Lungs: clear Psych: calm  ED Course / MDM  EKG:EKG Interpretation Date/Time:  Wednesday September 07 2024 19:52:07 EDT Ventricular Rate:  72 PR Interval:  154 QRS Duration:  144 QT Interval:  446 QTC Calculation: 488 R Axis:   65  Text Interpretation: Normal sinus rhythm Left bundle branch block Abnormal ECG  Confirmed by Franklyn Gills [phone]) on 09/08/2024 12:58:21 AM  I have reviewed the labs performed to date as well as medications administered while in observation.  Recent changes in the last 24 hours include none.  Plan  Current plan is for placement via TOC.    Dean Clarity, MD 09/13/24 0730

## 2024-09-13 NOTE — ED Notes (Signed)
 CSW went to visit patient at bedside since she requested to speak with CSW. Patient raised questions on why she could not have someone come pick her up to leave. CSW explained to her placement if being search for her and currently no facility has offered her a bed. Patient asked,  a facility , I have my own home where I have been living for 72 years . CSW then explained to her again on why placement is being searched for. Patient stated,  I cannot wrap my head around all this . Patient then asked who would she ask to pick her up to take her home. CSW shared with her that no one can come pick her up unless Oneil her LG said otherwise. Patient states,  oh Oneil has charmed you all and why did they not appoint my brother to be my LG Patient continued on and then stated to CSW,  I understand you just doing your job, I guess I need to make my calls and let people know what is going on .  CSW did reiterate to patient that if she call someone , she can not ask them to pick her up . Also shared with her that she gets 3 calls a day 5 minutes a piece and if staff finds out that she is asking people to come pick her up or gets triggered in any way , she could loose her phone privilege at that time. Patient then nodded her head stating that she understood. CSW updated nurse. Tech was present in room and stayed to monitor calls . CSW will continue to follow.

## 2024-09-13 NOTE — ED Notes (Addendum)
 Pt used the phone to call her sister. She said was [employer].

## 2024-09-14 MED ORDER — OLANZAPINE 10 MG IM SOLR
5.0000 mg | Freq: Once | INTRAMUSCULAR | Status: AC
  Administered 2024-09-14: 5 mg via INTRAMUSCULAR
  Filled 2024-09-14: qty 10

## 2024-09-14 NOTE — ED Notes (Signed)
 Pts LG Mark called for an update and informed Pt has been cooperative and calm today.

## 2024-09-14 NOTE — ED Notes (Signed)
 Pt came out of room and demanded to speak with whoever is holding her here against her will. This tech redirected her back to room and explained the IVC hold. Pt wants to talk to LG in person today.

## 2024-09-14 NOTE — ED Notes (Signed)
 Patient in hallway yelling and being irate unable to calm her down verbally. EDP notified  Medication administered

## 2024-09-14 NOTE — ED Notes (Addendum)
 Pt provided meal tray at this time. Pt resting comfortably in bed.

## 2024-09-14 NOTE — ED Notes (Signed)
 Security present to escort Pt outside to the court yard with the sitter. Pt calm and cooperative at this time.

## 2024-09-14 NOTE — ED Notes (Signed)
 Pt becoming increasingly agitated in hallway about her personal belongings and trying to leave. Pt requesting to make a phone call and wanting her pocket book/keys. Pt educated that she can not have her personal belongings and they are locked up in a safe place. Pt reeducated that she cannot leave as she is under IVC order. Security called and attempting to assist pt back to room. Pt becoming physically aggressive and attempting to hit and pinch staff. MD notified of pt behavior, see new orders.

## 2024-09-14 NOTE — ED Provider Notes (Signed)
 Emergency Medicine Observation Re-evaluation Note  Christine Cantrell is a 78 y.o. female, seen on rounds today.  Pt initially presented to the ED for complaints of Medical Clearance Currently, the patient is awaiting placement.  Physical Exam  BP (!) 136/92 (BP Location: Right Arm)   Pulse 75   Temp 98.2 F (36.8 C) (Oral)   Resp 16   Ht 5' 6 (1.676 m)   Wt 71.3 kg   SpO2 100%   BMI 25.37 kg/m  Physical Exam Alert and in no acute distress  ED Course / MDM  EKG:EKG Interpretation Date/Time:  Wednesday September 07 2024 19:52:07 EDT Ventricular Rate:  72 PR Interval:  154 QRS Duration:  144 QT Interval:  446 QTC Calculation: 488 R Axis:   65  Text Interpretation: Normal sinus rhythm Left bundle branch block Abnormal ECG  Confirmed by Franklyn Gills [phone]) on 09/08/2024 12:58:21 AM  I have reviewed the labs performed to date as well as medications administered while in observation.  Recent changes in the last 24 hours include none.  Plan  Current plan is for placement.    Suzette Pac, MD 09/14/24 [phone]

## 2024-09-14 NOTE — ED Notes (Signed)
Pt taking a shower at this time 

## 2024-09-14 NOTE — ED Notes (Signed)
 Pt allowed phone call with friend charlotte. Pt calm and cooperative at this time.

## 2024-09-15 MED ORDER — OLANZAPINE 5 MG PO TBDP
5.0000 mg | ORAL_TABLET | Freq: Three times a day (TID) | ORAL | Status: DC | PRN
  Administered 2024-09-15 – 2024-09-18 (×3): 5 mg via ORAL
  Filled 2024-09-15 (×5): qty 1

## 2024-09-15 MED ORDER — STERILE WATER FOR INJECTION IJ SOLN
INTRAMUSCULAR | Status: AC
  Administered 2024-09-15: 2 mL
  Filled 2024-09-15: qty 10

## 2024-09-15 MED ORDER — OLANZAPINE 10 MG IM SOLR
5.0000 mg | Freq: Once | INTRAMUSCULAR | Status: AC | PRN
  Administered 2024-09-15: 5 mg via INTRAMUSCULAR
  Filled 2024-09-15: qty 10

## 2024-09-15 NOTE — ED Notes (Signed)
 Pt had a total of 6 family members that visited within the last hour in groups of 3. Pt tolerated the visit well. After visits were over pt and sitter took laps around ED and is now talking to sitter calm in room.

## 2024-09-15 NOTE — ED Notes (Signed)
 Patient out at nurses station upset and stating she does not understand why she is here. Patient easily redirected into room and explained situation.

## 2024-09-15 NOTE — ED Notes (Signed)
 Pt agitation continued to increase. PRN PO Zyprexa  offered to which pt refused. Pt came out to the desk asking for her purse. Came close to this RN yelling stating she wanted her purse now. Attempted to redirect pt. To which pt continued to yell. Pt was given IM zyprexa  at 2156 by this RN to Left arm. Pt arm was held by charge [employer]. Security at bedside. Pt currently laying in bed with sitter outside pts room

## 2024-09-15 NOTE — ED Notes (Signed)
 Pt becoming increasingly restless. Sitter has redirected pt multiple time. Pt has made reports about wanting to leave. D/t behavior RN offered pt po zyprexa  x2. Pt sts she wasn't taking a medication that she doesn't know she's getting. RN attempted to inform pt the medication she was getting - only for pt to become more agitated. Concerns for increasing restlessness and agitation voiced with EDP S. Elnor. PRN order for Im zyprexa  placed by edp. Pt currently sitting in room on stretcher. Medication on hold.

## 2024-09-15 NOTE — ED Notes (Addendum)
 CSW spoke with Oneil today regarding the PASRR screener who came and explained what the PASRR means. CSW informed Oneil that at this time, their is not updates on facility searched. After speaking with Oneil, CSW spoke with Musu with [facility] DSS about independent living or a retirement home since facilities are worried abut Medicaid not getting approved or if it is, patient will be sanctioned and Medicaid would likely stop paying facility. However, patient does not qualify for special assistance medicaid for an ALF. Musu with [facility] DSS stated that patient cannot afford it, cannot afford private care, or go to an Independent / Retirement home. Musu shared that she will extend patient chart all over [place] to see if they can get her somewhere. [facility] did finally decline due to having too many  medicaid pending  patients. CSW will continue to follow.

## 2024-09-15 NOTE — ED Provider Notes (Signed)
 Emergency Medicine Observation Re-evaluation Note  Christine Cantrell is a 78 y.o. female, seen on rounds today.  Pt initially presented to the ED for complaints of Medical Clearance Currently, the patient is resting comfortably, no distress.  Physical Exam  BP 113/69 (BP Location: Right Arm)   Pulse 89   Temp 97.6 F (36.4 C) (Axillary)   Resp 17   Ht 1.676 m (5' 6)   Wt 71.3 kg   SpO2 99%   BMI 25.37 kg/m  Physical Exam General: No distress Cardiac: Normal heart rate Lungs: No increased work of breathing or hypoxia Psych: No agitation at this time  ED Course / MDM  EKG:EKG Interpretation Date/Time:  Wednesday September 07 2024 19:52:07 EDT Ventricular Rate:  72 PR Interval:  154 QRS Duration:  144 QT Interval:  446 QTC Calculation: 488 R Axis:   65  Text Interpretation: Normal sinus rhythm Left bundle branch block Abnormal ECG  Confirmed by Christine Cantrell [phone]) on 09/08/2024 12:58:21 AM  I have reviewed the labs performed to date as well as medications administered while in observation.  Recent changes in the last 24 hours include working on placement, the patient has been cleared by psychiatry.  Plan  Current plan is for placement.    Christine Rogue, MD 09/15/24 [phone]

## 2024-09-15 NOTE — ED Notes (Signed)
 Introduced self to pt. Pt is calm at this time. Asked the time. Was oriented to date and time. Pt denies any additional needs. Offered night time medication to help her sleep to which pt refused. Pt was able to recall that her family visited today. Otherwise pt remains confused. Pt ambulatory throughout room. Currently watching tv. Sitter remains with pt.

## 2024-09-16 ENCOUNTER — Encounter (HOSPITAL_COMMUNITY): Payer: Self-pay

## 2024-09-16 MED ORDER — LORAZEPAM 2 MG/ML IJ SOLN
1.0000 mg | Freq: Once | INTRAMUSCULAR | Status: AC
Start: 2024-09-16 — End: 2024-09-16
  Administered 2024-09-16: 1 mg via INTRAMUSCULAR
  Filled 2024-09-16: qty 1

## 2024-09-16 NOTE — ED Notes (Signed)
 Pt pleasant, calm and laughing with staff. Pt able to ambulate independently and is continent b/b. Pt voiced she feels as if she is a burden to people.

## 2024-09-16 NOTE — ED Notes (Signed)
 Pt yelling, and redirection is not effective. Pt wanting to enter other rooms and wanting to pick up items that were not appropriate. Redirection caused pt to become agitated, and aggressive.  After injection nurse stayed with pt and talked about her job, her family, where pt used to live and what school she attended. Pt is calmer sitting on the bed. Nurse attempted to get pt to lay back and she refused.

## 2024-09-16 NOTE — ED Notes (Signed)
 Entered chart to print labels

## 2024-09-16 NOTE — ED Notes (Signed)
 Legal Guardian called wanting an update about how pt has been acting/settling in. Legal guardian was updated. Legal guardian stated that if she were to become agitated while vistors were here, to remove the visitors to decrease stimulation

## 2024-09-16 NOTE — ED Notes (Signed)
 Pt in room pacing currently. Intermittently calm. At times she can be uncooperative.

## 2024-09-16 NOTE — ED Notes (Addendum)
 CSW reviewed patient chart. Patient is displaying some agitation and refusal of meds. CSW checked referral HUB and still no bed offers. CSW did reach out to Musu with [facility] to check on updates to places she sent patient referral to outside of [facility], and asked if she needed any current notes on patient. Musu shared that she sent out several referrals to [facility] and [employer]- along other counties and have not heard back from anyone. She also stated that she will send more referrals and call facilities. CSW will continue to follow.   Addendum 1:20 pm   CSW was able to get requested notes and medications sent to Musu with [facility] DSS via secure email.

## 2024-09-16 NOTE — ED Notes (Addendum)
 Pt was becoming agitated and aggressive with staff. Pt believes there is a water  leak in the floor and she needs to call a plumber to fix it. Tried to redirect pt back to room, but she continues to measure the water  in the floor.

## 2024-09-16 NOTE — ED Provider Notes (Signed)
 Emergency Medicine Observation Re-evaluation Note  Christine Cantrell is a 78 y.o. female, seen on rounds today.  Pt initially presented to the ED for complaints of Medical Clearance Currently, the patient is awaiting placement.  Physical Exam  BP 132/72 (BP Location: Left Arm)   Pulse 72   Temp 98.5 F (36.9 C) (Oral)   Resp 17   Ht 5' 6 (1.676 m)   Wt 71.3 kg   SpO2 99%   BMI 25.37 kg/m  Physical Exam Alert and in no acute distress  ED Course / MDM  EKG:EKG Interpretation Date/Time:  Wednesday September 07 2024 19:52:07 EDT Ventricular Rate:  72 PR Interval:  154 QRS Duration:  144 QT Interval:  446 QTC Calculation: 488 R Axis:   65  Text Interpretation: Normal sinus rhythm Left bundle branch block Abnormal ECG  Confirmed by Franklyn Gills [phone]) on 09/08/2024 12:58:21 AM  I have reviewed the labs performed to date as well as medications administered while in observation.  Recent changes in the last 24 hours include none.  Plan  Current plan is for placement.    Suzette Pac, MD 09/16/24 1205

## 2024-09-16 NOTE — ED Notes (Signed)
 IM Ativan  was given by Lacinda Marica PEAK. Pt is now calmer and in room talking with staff.

## 2024-09-16 NOTE — ED Notes (Signed)
 Pt has refused medications for this RN. Charge Nurse Ileana requested to attempt administration. Pt refused medications for second RN as well.

## 2024-09-17 MED ORDER — OLANZAPINE 10 MG IM SOLR
5.0000 mg | Freq: Once | INTRAMUSCULAR | Status: AC | PRN
  Administered 2024-09-17: 5 mg via INTRAMUSCULAR
  Filled 2024-09-17: qty 10

## 2024-09-17 MED ORDER — STERILE WATER FOR INJECTION IJ SOLN
INTRAMUSCULAR | Status: AC
  Administered 2024-09-17: 2.1 mL
  Filled 2024-09-17: qty 10

## 2024-09-17 NOTE — ED Notes (Signed)
 Pt ambulated to restroom. Is cooperative with staff at this time.

## 2024-09-17 NOTE — ED Notes (Signed)
 Pt asleep at this time. When she awakes, she will be offered her meds.

## 2024-09-17 NOTE — ED Notes (Signed)
 Pt ambulated to restroom, ambulated to room, trying to open cabinet doors, says its time for her to go home and walking in the halls, Pt is redirectable to her room.

## 2024-09-18 NOTE — ED Notes (Signed)
 Pt brushed her teeth & encouraged to lay down to get some rest since it was 10pm. Pt in bed w/ blankets & lights off. Pt resting comfortably at this time.

## 2024-09-18 NOTE — ED Notes (Addendum)
 1100 - Round check is complete at this time. Pt denies needing anything like food, drink or bathroom. Pt has no s/s of distress.  1200 - Round check is complete at this time. Pt denies needing anything like food, drink or bathroom. Pt has no s/s of distress. Pt is sitting out in the hallway with the sitter calmly and quietly.   1230 - Pt received her lunch tray and ate most of the food.   1300 - Round check is complete at this time. Pt denies needing anything like food, drink or bathroom. Pt has no s/s of distress. Pt has no s/s of distress. Pt is sitting out in the hallway with the sitter calmly and quietly.   1400 - Round check is complete at this time. Pt denies needing anything like food, drink or bathroom. Pt has no s/s of distress.  1500 - Round check is complete at this time. Pt denies needing anything like food, drink or bathroom. Pt has no s/s of distress.  1600 - Round check is complete at this time. Pt denies needing anything like food, drink or bathroom. Pt has no s/s of distress.  1700 - Round check is complete at this time. Pt denies needing anything like food, drink or bathroom. Pt has no s/s of distress. Pt's family came to visit pt. They were here for about 45 mins.  1745 - Pt's dinner tray has arrived at this time.   1800 - Round check is complete at this time. Pt denies needing anything like food, drink or bathroom. Pt has no s/s of distress. Pt requested a phone to called her friend Roselie. Pt used the phone with staff supervision. Phone call last ed about 10 mins.   1900 - Round check is complete at this time. Pt denies needing anything like food, drink or bathroom. Pt has no s/s of distress. Pt is in her room writing something down on paper.

## 2024-09-18 NOTE — Consult Note (Signed)
 Was informed by inpatient psychiatric consulting services consult was entered for patient.  However, upon later review, no consult pending.  Reached out to nurse caring for patient, Tinnie Creighton, no additional information received.   Of note, patient was evaluated and cleared by psychiatry on 09/08/2024;  Since then she has remained in the hospital as a border awaiting placement.  If additional psychiatric services are needed, please re-consult.

## 2024-09-18 NOTE — ED Notes (Signed)
 Pt continues to come out of room asking for her purse and keys. Pt redirected to room. Pt in room tearful asking why we are holding her here. Pt given PRN zyprexa .

## 2024-09-18 NOTE — ED Provider Notes (Signed)
 Emergency Medicine Observation Re-evaluation Note  Christine Cantrell is a 78 y.o. female, seen on rounds today.  Pt initially presented to the ED for complaints of Medical Clearance Currently, the patient is awake and alert.  Pt has been here since 9/10 and has been awaiting placement.  Pt was initially brought in for a psych eval, but has been cleared by psych.  TOC has been trying to find placement.  Pt is not happy that she can't go on her daily walks, but is otherwise ok this am.  Physical Exam  BP 137/61 (BP Location: Left Arm)   Pulse 81   Temp 98 F (36.7 C) (Oral)   Resp 17   Ht 5' 6 (1.676 m)   Wt 71.3 kg   SpO2 100%   BMI 25.37 kg/m  Physical Exam General: awake and alert Cardiac: rr Lungs: clear Psych: calm  ED Course / MDM  EKG:EKG Interpretation Date/Time:  Wednesday September 07 2024 19:52:07 EDT Ventricular Rate:  72 PR Interval:  154 QRS Duration:  144 QT Interval:  446 QTC Calculation: 488 R Axis:   65  Text Interpretation: Normal sinus rhythm Left bundle branch block Abnormal ECG  Confirmed by Franklyn Gills [phone]) on 09/08/2024 12:58:21 AM  I have reviewed the labs performed to date as well as medications administered while in observation.  Recent changes in the last 24 hours include none.  Plan  Current plan is for TOC placement.    Dean Clarity, MD 09/18/24 [phone]

## 2024-09-19 DIAGNOSIS — F329 Major depressive disorder, single episode, unspecified: Secondary | ICD-10-CM | POA: Diagnosis not present

## 2024-09-19 DIAGNOSIS — F0390 Unspecified dementia without behavioral disturbance: Secondary | ICD-10-CM | POA: Diagnosis not present

## 2024-09-19 DIAGNOSIS — Z79899 Other long term (current) drug therapy: Secondary | ICD-10-CM | POA: Diagnosis not present

## 2024-09-19 DIAGNOSIS — Z853 Personal history of malignant neoplasm of breast: Secondary | ICD-10-CM | POA: Diagnosis not present

## 2024-09-19 DIAGNOSIS — I10 Essential (primary) hypertension: Secondary | ICD-10-CM | POA: Diagnosis not present

## 2024-09-19 DIAGNOSIS — E039 Hypothyroidism, unspecified: Secondary | ICD-10-CM | POA: Diagnosis not present

## 2024-09-19 MED ORDER — QUETIAPINE FUMARATE 25 MG PO TABS
25.0000 mg | ORAL_TABLET | Freq: Every day | ORAL | Status: DC
  Administered 2024-09-19 – 2024-09-22 (×4): 25 mg via ORAL
  Filled 2024-09-19 (×4): qty 1

## 2024-09-19 MED ORDER — ESCITALOPRAM OXALATE 10 MG PO TABS
10.0000 mg | ORAL_TABLET | Freq: Every day | ORAL | Status: DC
  Administered 2024-09-19 – 2024-09-23 (×5): 10 mg via ORAL
  Filled 2024-09-19 (×5): qty 1

## 2024-09-19 NOTE — ED Notes (Addendum)
 Pt on phone with brother Jennings); sitter monitoring. Phone call 2/3 for the day.

## 2024-09-19 NOTE — ED Notes (Addendum)
 LG Mark Morphies given update, informed pt slept most of day, no outbursts. States to guardian of estate and still waiting to get bonded, no forwarded movement until probably early next week.

## 2024-09-19 NOTE — ED Notes (Signed)
 Pt has been calm & cooperative for most of the shift. Was agitated & restless in beginning, however pt was redirected & encouraged to get some rest. Pt has been resting comfortably throughout the night. NAD noted.

## 2024-09-19 NOTE — ED Notes (Addendum)
 Psych computer in room, pt talking with PMHNP.

## 2024-09-19 NOTE — ED Notes (Signed)
 CSW reviewed patient chart. Placement is still being searched for. CSW reached out to Musu with [facility] DSS to discuss updates on her end - since updated notes were sent Friday and she shared that she would send patient information out to outside counties. CSW did reach back out to [place] with [facility] to see if she had a chance to speak with Musu with [facility] DSS to discuss potential placement. CSW will continue to follow.

## 2024-09-19 NOTE — Consult Note (Signed)
 [facility] Psychiatric Consult Initial  Patient Name: .Christine Cantrell  MRN: [id number]  DOB: 1946-10-22  Consult Order details:  Orders (From admission, onward)     Start     Ordered   09/19/24 0803  CONSULT TO CALL ACT TEAM       Ordering Provider: Cleotilde Rogue, MD  Provider:  (Not yet assigned)  Question:  Reason for Consult?  Answer:  med mgmt   09/19/24 0802   09/08/24 0122  CONSULT TO CALL ACT TEAM       Ordering Provider: Lorette Mayo, MD  Provider:  (Not yet assigned)  Question:  Reason for Consult?  Answer:  eval for suicidal comments   09/08/24 0121             Mode of Visit: Tele-visit Location of Provider [facility] Patient location: [facility] ED   ...SABRASABRAMode of Visit: Tele-visit Virtual Statement:TELE PSYCHIATRY ATTESTATION & CONSENT As the provider for this telehealth consult, I attest that I verified the patient's identity using two separate identifiers, introduced myself to the patient, provided my credentials, disclosed my location, and performed this encounter via a HIPAA-compliant, real-time, face-to-face, two-way, interactive audio and video platform and with the full consent and agreement of the patient (or guardian as applicable.) Patient physical location: *Telehealth provider physical location: home office in state of [place] .   Video start time: 17:45 pm Video end time: 18:15 pm    Psychiatry Consult Evaluation  Service Date: September 19, 2024 LOS:  LOS: 0 days  Chief Complaint Confusion, reported suicidal comments, memory impairment  Primary Psychiatric Diagnoses  Neurocognitive Disorder (Dementia)  2.   MDD  Assessment  Christine Cantrell is a 78 y.o. female admitted: Presented to the EDfor 09/07/2024  4:39 PM for altered mental status and agitations. She presented under involuntary commitment. . She carries no psychiatric diagnoses  and has a past medical history of  HTN, Hyperlipidemia, with no current complications.    Her current  presentation of agitations and anger  is most consistent with reported symptoms. She meets criteria for memory care services  based on hx, current and reported symptoms.  She presents with no current outpatient psychotropic medications,  and historically she has never been prescribed any. She denies hx of mental illness. The patient currently denies SI/HI/AVH, is stable, and has become more cooperative during her stay. She demonstrates some memory impairment, but no active psychosis or agitation. She does not currently meet criteria for involuntary admission, but given recent concerns and new medication initiation, overnight observation is warranted for safety and monitoring. Please see plan below for detailed recommendations.   Diagnoses:  Active Hospital problems: Principal Problem:   Altered thought processes    Plan   ## Psychiatric Medication Recommendations:  -Seroquel  25mg  PO at bedtime for mood -Lexapro  10mg  PO day for depression   ## Medical Decision Making Capacity: Patient has a guardian and has thus been adjudicated incompetent; please involve patients guardian in medical decision making  ## Further Work-up:  -- To be determined -- most recent EKG: Qtc 488 on 09/08/2024 will get repeat Continue to monitor for prolonged QT intervals while she is on psychotropic medication.  -- Pertinent labwork reviewed earlier this admission includes: ED labs reviewed    ## Disposition:-- Continue to observe overnight for behavioral stability and medication tolerance. Reassess in the morning to determine readiness to psychiatrically clear patient with outpatient follow-up on 09/20/24. Also pending contact with her legal guardian Christine Cantrell.    ##  Behavioral / Environmental: -Delirium Precautions: Delirium Interventions for Nursing and Staff: - RN to open blinds every AM. - To Bedside: Glasses, hearing aide, and pt's own shoes. Make available to patients. when possible and encourage use. -  Encourage po fluids when appropriate, keep fluids within reach. - OOB to chair with meals. - Passive ROM exercises to all extremities with AM & PM care. - RN to assess orientation to person, time and place QAM and PRN. - Recommend extended visitation hours with familiar family/friends as feasible. - Staff to minimize disturbances at night. Turn off television when pt asleep or when not in use.                ## Safety and Observation Level:  - Based on my clinical evaluation, I estimate the patient to be at low risk of self harm in the current setting. - At this time, we recommend  routine. This decision is based on my review of the chart including patient's history and current presentation, interview of the patient, mental status examination, and consideration of suicide risk including evaluating suicidal ideation, plan, intent, suicidal or self-harm behaviors, risk factors, and protective factors. This judgment is based on our ability to directly address suicide risk, implement suicide prevention strategies, and develop a safety plan while the patient is in the clinical setting. Please contact our team if there is a concern that risk level has changed.   CSSR Risk Category:C-SSRS RISK CATEGORY: No Risk   Suicide Risk Assessment: Patient has following modifiable risk factors for suicide: medication noncompliance, and current symptoms: anxiety/panic, insomnia, impulsivity, anhedonia, hopelessness, which we are addressing by recommending overnight observation. Patient has following non-modifiable or demographic risk factors for suicide: none Patient has the following protective factors against suicide: Supportive family, and no history of suicide attempts   Thank you for this consult request. Recommendations have been communicated to the primary team.  We will continue to follow at this time.   Christine Cantrell, PMHNP       History of Present Illness  Relevant Aspects of Hospital ED Course:   Admitted on 09/07/2024 for Confusion, reported suicidal comments, memory impairment  Patient Report:  Patient has been in the emergency department for 12 days due to confusion and reportedly making suicidal statements. There has been concern for memory impairment.  On evaluation today, patient reports that she is bored and tired of being in the hospital, stating, "I'm ready to go home and stop staring at these walls." She reports living alone, with her brother residing a few houses down. She denies current suicidal ideations (SI), homicidal ideations (HI), and auditory/visual hallucinations (AVH).  Patient states she does not recall making suicidal comments, attributing the incident to an argument with a family member, the details of which she cannot remember.  She reports no psychiatric history. However, chart review indicates a past history of depression. Patient self-reports hypertension as her only medical diagnosis, but chart review reveals:  History of breast cancer, currently on anastrozole   Hypothyroidism  Hypertension (HTN)  Hyperlipidemia (HLD)  GERD/Hiatal hernia  Depression  Left bundle branch block (LBBB)  Psych ROS:  Depression: Situational due to loneliness Anxiety: Denies  Mania (lifetime and current): denies Psychosis: (lifetime and current):denies   Collateral information:  Attempted to contact [employer], no answer  Review of Systems  Psychiatric/Behavioral:  Positive for depression.      Psychiatric and Social History  Psychiatric History:  Information collected from patient    Prev Dx/Sx: depression  Current Psych Provider: none Home Meds (current): none Previous Med Trials: unknown  Therapy: none   Prior Psych Hospitalization: denies Prior Self Harm: denies Prior Violence: denies   Family Psych History: unknown  Family Hx suicide: unknown   Social History:  Developmental Hx: WNL Educational Hx: graduated high school  Occupational  Hx: Retired Armed forces operational officer Hx: none Living Situation: lives alone but close to family members  Spiritual Hx: unknown Access to weapons/lethal means: denies    Substance History Alcohol: denies  Tobacco: denies  Illicit drugs: denies  Prescription drug abuse: denies  Rehab hx: denies   Exam Findings  Physical Exam:  Vital Signs:  Temp:  [98.4 F (36.9 C)-98.6 F (37 C)] 98.6 F (37 C) (09/22 1652) Pulse Rate:  [73-75] 75 (09/22 1652) Resp:  [16-18] 16 (09/22 1652) BP: (138-172)/(80-81) 172/80 (09/22 1652) SpO2:  [100 %] 100 % (09/22 1652) Blood pressure (!) 172/80, pulse 75, temperature 98.6 F (37 C), temperature source Oral, resp. rate 16, height 5' 6 (1.676 m), weight 71.3 kg, SpO2 100%. Body mass index is 25.37 kg/m.  Physical Exam Vitals and nursing note reviewed. Exam conducted with a chaperone present.  Neurological:     Mental Status: She is alert.  Psychiatric:        Attention and Perception: Attention normal.        Mood and Affect: Affect is labile.        Speech: Speech normal.        Behavior: Behavior is cooperative.        Thought Content: Thought content normal.        Cognition and Memory: Cognition is impaired.     Mental Status Exam: General Appearance: Casual  Orientation:  Full (Time, Place, and Person)  Memory:  Immediate;   Fair Recent;   Fair Remote;   Fair  Concentration:  Concentration: Fair and Attention Span: Fair  Recall:  Fair  Attention  Fair  Eye Contact:  Fair  Speech:  Clear and Coherent  Language:  Fair  Volume:  Normal  Mood: irritable in beginning but more relaxed throughout eval  Affect:  WNL  Thought Process:  Coherent  Thought Content:  WDL  Suicidal Thoughts:  No  Homicidal Thoughts:  No  Judgement:  Fair   Insight:  Fair  Psychomotor Activity:  Normal  Akathisia:  NA  Fund of Knowledge:  Fair       Assets:  Manufacturing systems engineer Desire for Improvement Physical Health Social Support  Cognition:  WNL  ADL's:   Intact  AIMS (if indicated):        Other History   These have been pulled in through the EMR, reviewed, and updated if appropriate.  Family History:  The patient's family history includes Cancer in her nephew; Goiter in her mother and sister; Heart disease in her brother and brother; High Cholesterol in her father and mother; Hypertension in her brother, brother, father, and mother; Lung cancer in her maternal uncle; Lung disease in her brother; Pancreatic cancer in her nephew; Prostate cancer (age of onset: 76) in her brother.  Medical History: Past Medical History:  Diagnosis Date   Abnormal EKG saw dr hochrein 3 yrs ago for   hx of left bundle branch block on ekg since 2002   Breast cancer (HCC)    Dementia (HCC) 09/08/2024   per notes   Depression    Diverticulosis large intestine w/o perforation or abscess w/bleeding 2016   Family history of lung cancer  Family history of pancreatic cancer    Family history of prostate cancer    GERD (gastroesophageal reflux disease)    hx of   High cholesterol    History of hiatal hernia    Hx of colonic polyps 2016   adenomatous   Hypertension    Hypothyroidism    Pancreatic cyst    Thyroid  disease    hypothyroidism    Surgical History: Past Surgical History:  Procedure Laterality Date   ABDOMINAL HYSTERECTOMY  2002   complete for plyps   BREAST LUMPECTOMY WITH RADIOACTIVE SEED LOCALIZATION Right 08/22/2020   Procedure: RIGHT BREAST LUMPECTOMY WITH RADIOACTIVE SEED LOCALIZATION;  Surgeon: Aron Shoulders, MD;  Location: [facility] OR;  Service: General;  Laterality: Right;   ESOPHAGOGASTRODUODENOSCOPY N/A 09/30/2018   Procedure: ESOPHAGOGASTRODUODENOSCOPY (EGD);  Surgeon: Teressa Toribio SQUIBB, MD;  Location: [facility];  Service: Endoscopy;  Laterality: N/A;   EUS N/A 09/30/2018   Procedure: UPPER ENDOSCOPIC ULTRASOUND (EUS) RADIAL;  Surgeon: Teressa Toribio SQUIBB, MD;  Location: [facility];  Service: Endoscopy;  Laterality: N/A;   LIVER BIOPSY      normal result   thryoid needle biopsy  1990's   THYROIDECTOMY  1982     Medications:   Current Facility-Administered Medications:    anastrozole  (ARIMIDEX ) tablet 1 mg, 1 mg, Oral, Daily, Francesca, Elsie CROME, MD, 1 mg at 09/19/24 0940   escitalopram  (LEXAPRO ) tablet 10 mg, 10 mg, Oral, Daily, Motley-Mangrum, Danyal Adorno A, PMHNP   lisinopril  (ZESTRIL ) tablet 20 mg, 20 mg, Oral, Daily, 20 mg at 09/19/24 0941 **AND** hydrochlorothiazide  (HYDRODIURIL ) tablet 12.5 mg, 12.5 mg, Oral, Daily, Francesca Elsie CROME, MD, 12.5 mg at 09/19/24 0940   levothyroxine  (SYNTHROID ) tablet 75 mcg, 75 mcg, Oral, Q0600, Francesca Elsie CROME, MD, 75 mcg at 09/19/24 0941   magnesium  oxide (MAG-OX) tablet 200 mg, 200 mg, Oral, Daily, Francesca Elsie CROME, MD, 200 mg at 09/19/24 0941   memantine  (NAMENDA ) tablet 5 mg, 5 mg, Oral, BID, Francesca Elsie CROME, MD, 5 mg at 09/19/24 0941   multivitamin with minerals tablet 1 tablet, 1 tablet, Oral, QPC lunch, Francesca Elsie CROME, MD, 1 tablet at 09/18/24 2122   OLANZapine  zydis (ZYPREXA ) disintegrating tablet 5 mg, 5 mg, Oral, TID PRN, Byungura, Veronique M, NP, 5 mg at 09/18/24 2006   QUEtiapine  (SEROQUEL ) tablet 25 mg, 25 mg, Oral, QHS, Motley-Mangrum, Konrad Hoak A, PMHNP   simvastatin  (ZOCOR ) tablet 20 mg, 20 mg, Oral, Daily, Francesca Elsie CROME, MD, 20 mg at 09/19/24 0941  Current Outpatient Medications:    anastrozole  (ARIMIDEX ) 1 MG tablet, TAKE 1 TABLET BY MOUTH EVERY DAY, Disp: 90 tablet, Rfl: 3   Coenzyme Q10-Vitamin E  (QUNOL ULTRA COQ10 PO), Take 1 capsule by mouth daily after lunch. , Disp: , Rfl:    Krill Oil 500 MG CAPS, Take 500 mg by mouth daily after lunch. , Disp: , Rfl:    levothyroxine  (SYNTHROID ) 75 MCG tablet, Take 1 tablet (75 mcg total) by mouth daily before breakfast., Disp: 90 tablet, Rfl: 3   lisinopril -hydrochlorothiazide  (ZESTORETIC ) 20-12.5 MG tablet, Take 1 tablet by mouth daily., Disp: 90 tablet, Rfl: 3   Magnesium  250 MG TABS, Take 250 mg by mouth  daily after lunch. , Disp: , Rfl:    memantine  (NAMENDA ) 5 MG tablet, Take 1 tablet (5 mg total) by mouth 2 (two) times daily. For memory, Disp: 180 tablet, Rfl: 3   Multiple Vitamin (MULTIVITAMIN WITH MINERALS) TABS tablet, Take 1 tablet by mouth daily after lunch. Women's One A Day  Multivitamin , Disp: , Rfl:    simvastatin  (ZOCOR ) 20 MG tablet, Take 1 tablet (20 mg total) by mouth daily., Disp: 90 tablet, Rfl: 3  Allergies: No Known Allergies  Kagen Kunath MOTLEY-MANGRUM, PMHNP

## 2024-09-19 NOTE — ED Notes (Signed)
 Attempted to contact pt brother for pt to speak to; I was sent to voicemail. Pt not wanting to speak to anyone else at this time.

## 2024-09-19 NOTE — ED Notes (Addendum)
 Pt on phone with friend Rosslyn); sitter monitoring. Phone call 1/3 for the day.

## 2024-09-19 NOTE — ED Provider Notes (Signed)
 Emergency Medicine Observation Re-evaluation Note  Christine Cantrell is a 78 y.o. female, seen on rounds today.  Pt initially presented to the ED for complaints of history of dementia, now boarding in ED.  No new c/o this AM.   Physical Exam  BP 139/73   Pulse 73   Temp 98 F (36.7 C) (Oral)   Resp 18   Ht 1.676 m (5' 6)   Wt 71.3 kg   SpO2 99%   BMI 25.37 kg/m  Physical Exam General: calm, resting.  Cardiac: regular rate.  Lungs: breathing comfortably. Psych: calm.  ED Course / MDM   I have reviewed the labs performed to date as well as medications administered while in observation.  Recent changes in the last 24 hours include ED obs, reassessment.   Plan  Current plan is for TOC placement.  From chart review, as patients issue is chronic, not new, it appears that patient, family/guardian/DSS should have pursued facility placement as outpatient (and pursued payor source for long term placement if that was needed to achieve their goal of placement).   As no acute psychiatric emergency, using IVC process to try to have payor source and placement achieved from ED setting is not appropriate, and is c/w misuse of the system.   TOC continuing to work on placement.  As patient has been boarding in ED now > 270 hours and no definite/clear plan in place, will engage [facility] leaders in plan for patient.      Bernard Drivers, MD 09/19/24 [phone]

## 2024-09-19 NOTE — ED Notes (Signed)
 Pt ambulated to bathroom, then returned to the room to eat breakfast.

## 2024-09-20 DIAGNOSIS — I10 Essential (primary) hypertension: Secondary | ICD-10-CM | POA: Diagnosis not present

## 2024-09-20 DIAGNOSIS — Z853 Personal history of malignant neoplasm of breast: Secondary | ICD-10-CM | POA: Diagnosis not present

## 2024-09-20 DIAGNOSIS — F0390 Unspecified dementia without behavioral disturbance: Secondary | ICD-10-CM

## 2024-09-20 DIAGNOSIS — F329 Major depressive disorder, single episode, unspecified: Secondary | ICD-10-CM

## 2024-09-20 DIAGNOSIS — Z79899 Other long term (current) drug therapy: Secondary | ICD-10-CM | POA: Diagnosis not present

## 2024-09-20 DIAGNOSIS — E039 Hypothyroidism, unspecified: Secondary | ICD-10-CM | POA: Diagnosis not present

## 2024-09-20 NOTE — Consult Note (Signed)
 [facility] Psychiatric Consult Follow up  Patient Name: .Christine Cantrell  MRN: [id number]  DOB: Jul 19, 1946  Consult Order details:  Orders (From admission, onward)     Start     Ordered   09/19/24 0803  CONSULT TO CALL ACT TEAM       Ordering Provider: Cleotilde Rogue, MD  Provider:  (Not yet assigned)  Question:  Reason for Consult?  Answer:  med mgmt   09/19/24 0802   09/08/24 0122  CONSULT TO CALL ACT TEAM       Ordering Provider: Lorette Mayo, MD  Provider:  (Not yet assigned)  Question:  Reason for Consult?  Answer:  eval for suicidal comments   09/08/24 0121             Mode of Visit: Tele-visit Location of Provider [facility] Patient location: [facility] ED   ...SABRASABRAMode of Visit: Tele-visit Virtual Statement:TELE PSYCHIATRY ATTESTATION & CONSENT As the provider for this telehealth consult, I attest that I verified the patient's identity using two separate identifiers, introduced myself to the patient, provided my credentials, disclosed my location, and performed this encounter via a HIPAA-compliant, real-time, face-to-face, two-way, interactive audio and video platform and with the full consent and agreement of the patient (or guardian as applicable.) Patient physical location: *Telehealth provider physical location: home office in state of [place] .   Video start time: 16:30 pm Video end time: 16:50 pm    Psychiatry Consult Evaluation  Service Date: September 20, 2024 LOS:  LOS: 0 days  Chief Complaint Confusion, reported suicidal comments, memory impairment  Primary Psychiatric Diagnoses  Neurocognitive Disorder (Dementia)  2.   MDD  Assessment  Christine Cantrell is a 78 y.o. female admitted: Presented to the EDfor 09/07/2024  4:39 PM for altered mental status and agitations. She presented under involuntary commitment. . She carries no psychiatric diagnoses  and has a past medical history of  HTN, Hyperlipidemia, with no current complications.    The patient  initially presented with confusion, memory impairment, and suicidal ideations. Over the past 48 hours, there have been no safety concerns, and she has remained medication-compliant without requiring any PRNs. She is currently stable, denies SI/HI, shows no psychosis, and is able to contract for safety.  Her presentation is consistent with progressive cognitive decline, likely related to dementia, in the context of a family history of Alzheimer's disease. Please see plan below for detailed recommendations.   Diagnoses:  Active Hospital problems: Principal Problem:   Altered thought processes    Plan   ## Psychiatric Medication Recommendations:  Continue -Seroquel  25mg  PO at bedtime for mood -Lexapro  10mg  PO day for depression   ## Medical Decision Making Capacity: Patient has a guardian and has thus been adjudicated incompetent; please involve patients guardian in medical decision making  ## Further Work-up:  -- To be determined -- most recent EKG: Qtc 488 on 09/08/2024 will get repeat Continue to monitor for prolonged QT intervals while she is on psychotropic medication.  -- Pertinent labwork reviewed earlier this admission includes: ED labs reviewed    ## Disposition:-- The patient is psychiatrically cleared for discharge planning. No active psychiatric symptoms requiring inpatient psychiatric hospitalization at this time. DSS and social work to continue facilitating placement.  Collateral party Alonso) has been informed and is in full agreement with the plan.    ## Behavioral / Environmental: -Delirium Precautions: Delirium Interventions for Nursing and Staff: - RN to open blinds every AM. - To Bedside: Glasses, hearing  aide, and pt's own shoes. Make available to patients. when possible and encourage use. - Encourage po fluids when appropriate, keep fluids within reach. - OOB to chair with meals. - Passive ROM exercises to all extremities with AM & PM care. - RN to assess orientation  to person, time and place QAM and PRN. - Recommend extended visitation hours with familiar family/friends as feasible. - Staff to minimize disturbances at night. Turn off television when pt asleep or when not in use.                ## Safety and Observation Level:  - Based on my clinical evaluation, I estimate the patient to be at low risk of self harm in the current setting. - At this time, we recommend  routine. This decision is based on my review of the chart including patient's history and current presentation, interview of the patient, mental status examination, and consideration of suicide risk including evaluating suicidal ideation, plan, intent, suicidal or self-harm behaviors, risk factors, and protective factors. This judgment is based on our ability to directly address suicide risk, implement suicide prevention strategies, and develop a safety plan while the patient is in the clinical setting. Please contact our team if there is a concern that risk level has changed.   CSSR Risk Category:C-SSRS RISK CATEGORY: No Risk   Suicide Risk Assessment: Patient has following modifiable risk factors for suicide: impulsivity, which we are addressing by psychiatrically cleared the patient, hospital will consult TTS if needed Patient has following non-modifiable or demographic risk factors for suicide: none Patient has the following protective factors against suicide: Supportive family, and no history of suicide attempts   Thank you for this consult request. Recommendations have been communicated to the primary team.  We will sign off at this time.   CATHALEEN ADAM, PMHNP       History of Present Illness  Relevant Aspects of Hospital ED Course:  Admitted on 09/07/2024 for Confusion, reported suicidal comments, memory impairment  Patient Report:  The patient is an 78 year old female who was brought to the emergency department due to worsening confusion, memory difficulties, and reported  suicidal ideations.  During the past 48 hours, the patient has not voiced any suicidal ideations, nor has she demonstrated agitation or aggressive behaviors. Her condition has significantly improved with consistent medication compliance, and no PRN medications have been required during this period.  On evaluation today, the patient describes her day as "normal, not exciting," adding, "How much excitement can you have in the hospital?" She reports that the most exciting part of her day is eating, which she enjoys. She states she has been watching television and visiting with family members.  The patient denies suicidal or homicidal ideations, auditory or visual hallucinations, paranoia, or delusional thinking. She appears cooperative, pleasant, and calm throughout the interview.  She reports consistent compliance with her medications, no complaints of side effects, and expresses no current concerns.  Psych ROS:  Depression: Denies Anxiety: Denies  Mania (lifetime and current): denies Psychosis: (lifetime and current):denies   Collateral information:  Collateral was obtained from the patient's legal guardian and nephew-in-law, Mark. Oneil reported that a CT scan completed several years ago showed areas of "dead brain cells." Over the past year, the patient's confusion has progressed rapidly, which has been noticed by both family members and individuals in the community. The patient's mother had a history of dementia/Alzheimer's disease. The patient's legal guardian of the state is based in [place] and is  currently in the process of becoming bonded, which will take approximately one more week. [place] DSS and the hospital social work team are actively working to secure appropriate placement for the patient upon discharge. Medication changes were reviewed with Oneil: Lexapro  10 mg PO daily Seroquel  25 mg PO at bedtime  Oneil verbalized understanding and agreement with the plan for psychiatric  clearance.  Review of Systems  Psychiatric/Behavioral: Negative.       Psychiatric and Social History  Psychiatric History:  Information collected from patient    Prev Dx/Sx: depression Current Psych Provider: none Home Meds (current): none Previous Med Trials: unknown  Therapy: none   Prior Psych Hospitalization: denies Prior Self Harm: denies Prior Violence: denies   Family Psych History: unknown  Family Hx suicide: unknown   Social History:  Developmental Hx: WNL Educational Hx: graduated high school  Occupational Hx: Retired Armed forces operational officer Hx: none Living Situation: lives alone but close to family members  Spiritual Hx: unknown Access to weapons/lethal means: denies    Substance History Alcohol: denies  Tobacco: denies  Illicit drugs: denies  Prescription drug abuse: denies  Rehab hx: denies   Exam Findings  Physical Exam:  Vital Signs:  Temp:  [98 F (36.7 C)-98.6 F (37 C)] 98.6 F (37 C) (09/23 1549) Pulse Rate:  [62-85] 85 (09/23 1549) Resp:  [15-20] 16 (09/23 1549) BP: (112-177)/(67-77) 112/67 (09/23 1549) SpO2:  [98 %] 98 % (09/23 1549) Blood pressure 112/67, pulse 85, temperature 98.6 F (37 C), temperature source Oral, resp. rate 16, height 5' 6 (1.676 m), weight 71.3 kg, SpO2 98%. Body mass index is 25.37 kg/m.  Physical Exam Vitals and nursing note reviewed. Exam conducted with a chaperone present.  Neurological:     Mental Status: She is alert.  Psychiatric:        Attention and Perception: Attention normal.        Mood and Affect: Mood normal.        Speech: Speech normal.        Behavior: Behavior is cooperative.        Thought Content: Thought content normal.        Cognition and Memory: Cognition is impaired.     Mental Status Exam: General Appearance: Casual  Orientation:  Full (Time, Place, and Person)  Memory:  Mild impairment consistent with reported history; No acute confusion at this time  Concentration:  Concentration: Fair and  Attention Span: Fair  Recall:  Fair  Attention  Fair  Eye Contact:  Fair  Speech:  Clear and Coherent  Language:  Fair  Volume:  Normal  Mood: Stable, euthymic  Affect:  WNL  Thought Process:  Logical, goal-directed  Thought Content:  WDL  Suicidal Thoughts:  No  Homicidal Thoughts:  No  Judgement:  Fair   Insight:  Fair  Psychomotor Activity:  Normal  Akathisia:  NA  Fund of Knowledge:  Fair    Assets:  Manufacturing systems engineer Desire for Improvement Physical Health Social Support  Cognition:  WNL; No acute confusion at this time  ADL's:  Intact  AIMS (if indicated):        Other History   These have been pulled in through the EMR, reviewed, and updated if appropriate.  Family History:  The patient's family history includes Cancer in her nephew; Goiter in her mother and sister; Heart disease in her brother and brother; High Cholesterol in her father and mother; Hypertension in her brother, brother, father, and mother; Lung cancer in her  maternal uncle; Lung disease in her brother; Pancreatic cancer in her nephew; Prostate cancer (age of onset: 73) in her brother.  Medical History: Past Medical History:  Diagnosis Date   Abnormal EKG saw dr hochrein 3 yrs ago for   hx of left bundle branch block on ekg since 2002   Breast cancer (HCC)    Dementia (HCC) 09/08/2024   per notes   Depression    Diverticulosis large intestine w/o perforation or abscess w/bleeding 2016   Family history of lung cancer    Family history of pancreatic cancer    Family history of prostate cancer    GERD (gastroesophageal reflux disease)    hx of   High cholesterol    History of hiatal hernia    Hx of colonic polyps 2016   adenomatous   Hypertension    Hypothyroidism    Pancreatic cyst    Thyroid  disease    hypothyroidism    Surgical History: Past Surgical History:  Procedure Laterality Date   ABDOMINAL HYSTERECTOMY  2002   complete for plyps   BREAST LUMPECTOMY WITH RADIOACTIVE SEED  LOCALIZATION Right 08/22/2020   Procedure: RIGHT BREAST LUMPECTOMY WITH RADIOACTIVE SEED LOCALIZATION;  Surgeon: Aron Shoulders, MD;  Location: [facility] OR;  Service: General;  Laterality: Right;   ESOPHAGOGASTRODUODENOSCOPY N/A 09/30/2018   Procedure: ESOPHAGOGASTRODUODENOSCOPY (EGD);  Surgeon: Teressa Toribio SQUIBB, MD;  Location: [facility];  Service: Endoscopy;  Laterality: N/A;   EUS N/A 09/30/2018   Procedure: UPPER ENDOSCOPIC ULTRASOUND (EUS) RADIAL;  Surgeon: Teressa Toribio SQUIBB, MD;  Location: [facility];  Service: Endoscopy;  Laterality: N/A;   LIVER BIOPSY     normal result   thryoid needle biopsy  1990's   THYROIDECTOMY  1982     Medications:   Current Facility-Administered Medications:    anastrozole  (ARIMIDEX ) tablet 1 mg, 1 mg, Oral, Daily, Scheving, William L, MD, 1 mg at 09/20/24 1152   escitalopram  (LEXAPRO ) tablet 10 mg, 10 mg, Oral, Daily, Motley-Mangrum, Zyair Russi A, PMHNP, 10 mg at 09/20/24 1152   lisinopril  (ZESTRIL ) tablet 20 mg, 20 mg, Oral, Daily, 20 mg at 09/20/24 1153 **AND** hydrochlorothiazide  (HYDRODIURIL ) tablet 12.5 mg, 12.5 mg, Oral, Daily, Francesca Elsie CROME, MD, 12.5 mg at 09/20/24 1153   levothyroxine  (SYNTHROID ) tablet 75 mcg, 75 mcg, Oral, Q0600, Francesca Elsie CROME, MD, 75 mcg at 09/19/24 0941   magnesium  oxide (MAG-OX) tablet 200 mg, 200 mg, Oral, Daily, Francesca Elsie CROME, MD, 200 mg at 09/20/24 1153   memantine  (NAMENDA ) tablet 5 mg, 5 mg, Oral, BID, Francesca Elsie CROME, MD, 5 mg at 09/20/24 1151   multivitamin with minerals tablet 1 tablet, 1 tablet, Oral, QPC lunch, Francesca Elsie CROME, MD, 1 tablet at 09/19/24 2132   OLANZapine  zydis (ZYPREXA ) disintegrating tablet 5 mg, 5 mg, Oral, TID PRN, Byungura, Veronique M, NP, 5 mg at 09/18/24 2006   QUEtiapine  (SEROQUEL ) tablet 25 mg, 25 mg, Oral, QHS, Motley-Mangrum, Cordera Stineman A, PMHNP, 25 mg at 09/19/24 2133   simvastatin  (ZOCOR ) tablet 20 mg, 20 mg, Oral, Daily, Francesca Elsie CROME, MD, 20 mg at 09/20/24  1152  Current Outpatient Medications:    anastrozole  (ARIMIDEX ) 1 MG tablet, TAKE 1 TABLET BY MOUTH EVERY DAY, Disp: 90 tablet, Rfl: 3   Coenzyme Q10-Vitamin E  (QUNOL ULTRA COQ10 PO), Take 1 capsule by mouth daily after lunch. , Disp: , Rfl:    Krill Oil 500 MG CAPS, Take 500 mg by mouth daily after lunch. , Disp: , Rfl:    levothyroxine  (SYNTHROID )  75 MCG tablet, Take 1 tablet (75 mcg total) by mouth daily before breakfast., Disp: 90 tablet, Rfl: 3   lisinopril -hydrochlorothiazide  (ZESTORETIC ) 20-12.5 MG tablet, Take 1 tablet by mouth daily., Disp: 90 tablet, Rfl: 3   Magnesium  250 MG TABS, Take 250 mg by mouth daily after lunch. , Disp: , Rfl:    memantine  (NAMENDA ) 5 MG tablet, Take 1 tablet (5 mg total) by mouth 2 (two) times daily. For memory, Disp: 180 tablet, Rfl: 3   Multiple Vitamin (MULTIVITAMIN WITH MINERALS) TABS tablet, Take 1 tablet by mouth daily after lunch. Women's One A Day Multivitamin , Disp: , Rfl:    simvastatin  (ZOCOR ) 20 MG tablet, Take 1 tablet (20 mg total) by mouth daily., Disp: 90 tablet, Rfl: 3  Allergies: No Known Allergies  Reighn Kaplan MOTLEY-MANGRUM, PMHNP

## 2024-09-20 NOTE — ED Notes (Signed)
 Patient's guardian Christine Cantrell here to visit patient with other family members. Mark had placed pt under privacy and registration was unable to provide guardian that pt was visiting. After discussion with case management and assistant director discussion was made with the guardian and pt is now not confidential so other family may visit but information regarding the pt's status is only to be discussed with Christine Cantrell and Roselie Pa with a password provided.   Christine also states that a family member Tonya Molpus has been trying to get information and this member is not to have any information on this pt.

## 2024-09-20 NOTE — ED Notes (Signed)
 CSW reached out to [place] with [facility] about 30-days LOG. Debbie shared that she would take it to her [facility] to see what they say. CSW will continue to follow.

## 2024-09-20 NOTE — Progress Notes (Signed)
 Pharmacy Quality Measure Review  This patient is appearing on a report for being at risk of failing the adherence measure for cholesterol (statin) medications this calendar year.   Medication: simvastatin  20 mg daily Last fill date: 04/22/2024 for 90 day supply  Patient is currently hospitalized with concern for dementia and facility placement. Unable to contact patient, she will likely fail the adherence measure.   Woodie Jock, PharmD PGY1 Pharmacy Resident  09/20/2024

## 2024-09-20 NOTE — ED Provider Notes (Signed)
 Emergency Medicine Observation Re-evaluation Note  Christine Cantrell is a 78 y.o. female, seen on rounds today.  Pt initially presented to the ED for complaints of dementia, placement Currently, the patient is sleeping.  Physical Exam  BP (!) 147/75 (BP Location: Left Arm)   Pulse 82   Temp 98 F (36.7 C) (Oral)   Resp 20   Ht 5' 6 (1.676 m)   Wt 71.3 kg   SpO2 98%   BMI 25.37 kg/m  Physical Exam General: Sleeping Cardiac: Extremities well-perfused Lungs: Breathing is unlabored Psych: Deferred  ED Course / MDM  EKG:EKG Interpretation Date/Time:  Wednesday September 07 2024 19:52:07 EDT Ventricular Rate:  72 PR Interval:  154 QRS Duration:  144 QT Interval:  446 QTC Calculation: 488 R Axis:   65  Text Interpretation: Normal sinus rhythm Left bundle branch block Abnormal ECG  Confirmed by Franklyn Gills [phone]) on 09/08/2024 12:58:21 AM  I have reviewed the labs performed to date as well as medications administered while in observation.  Recent changes in the last 24 hours include none.  Plan  Current plan is for social work disposition.    Melvenia Motto, MD 09/20/24 [phone]

## 2024-09-20 NOTE — ED Notes (Signed)
 Unable to obtain VS due to patient sleeping.

## 2024-09-21 DIAGNOSIS — I10 Essential (primary) hypertension: Secondary | ICD-10-CM | POA: Diagnosis not present

## 2024-09-21 DIAGNOSIS — E039 Hypothyroidism, unspecified: Secondary | ICD-10-CM | POA: Diagnosis not present

## 2024-09-21 DIAGNOSIS — Z79899 Other long term (current) drug therapy: Secondary | ICD-10-CM | POA: Diagnosis not present

## 2024-09-21 DIAGNOSIS — Z853 Personal history of malignant neoplasm of breast: Secondary | ICD-10-CM | POA: Diagnosis not present

## 2024-09-21 NOTE — ED Notes (Signed)
 Pt asked nurse for an update on why she was here and when she was going to leave. Nurse informed pt that staff is looking for placement for pt. Pt was receptive and respectful to hearing this information. Pt asked for an unsweetened tea.

## 2024-09-21 NOTE — ED Notes (Signed)
 Called pts legal guardian d/t pt wanting to speak with them. Legal guardian said that he would come by tomorrow and visit with pt. Staff relayed this information to pt and she was happy to hear.

## 2024-09-21 NOTE — ED Provider Notes (Signed)
 Emergency Medicine Observation Re-evaluation Note  Christine Cantrell is a 78 y.o. female, seen on rounds today.  Pt initially presented to the ED for complaints of dementia, placement Currently, the patient is sleeping quietly.  Physical Exam  BP 112/67 (BP Location: Left Arm)   Pulse 85   Temp 98.6 F (37 C) (Oral)   Resp 16   Ht 5' 6 (1.676 m)   Wt 71.3 kg   SpO2 98%   BMI 25.37 kg/m  Physical Exam General: No acute distress Cardiac: Well-perfused Lungs: Nonlabored Psych: Unable to assess  ED Course / MDM  EKG:EKG Interpretation Date/Time:  Monday September 19 2024 18:57:18 EDT Ventricular Rate:  85 PR Interval:  160 QRS Duration:  140 QT Interval:  418 QTC Calculation: 497 R Axis:   -16  Text Interpretation: Normal sinus rhythm Left bundle branch block Abnormal ECG When compared with ECG of 07-Sep-2024 19:52, Previous ECG has undetermined rhythm, needs review Questionable change in QRS axis T wave inversion no longer evident in Inferior leads No significant change was found Confirmed by Trine Likes [phone]) on 09/20/2024 3:52:30 PM  I have reviewed the labs performed to date as well as medications administered while in observation.  Recent changes in the last 24 hours include psychiatry cleared.  Plan  Current plan is for placement.    [name], MD 09/21/24 [phone]

## 2024-09-21 NOTE — ED Notes (Signed)
 CSW spoke with Debbie at [facility] earlier regarding an update on offering patient a bed. Debbie shared that she could not locate patient pending application. CSW shared this information with Musu - [employer]. Musu called Debbie and provided her with patient reference number to pull application. Debbie stated that she sent everything to her [facility] business office- [place] for her to review and see if they can accept and will follow back up with CSW once she hears back.   Addendum 2:47 pm  Musu with [facility] called CSW stating that she just found out about patient having a 401K with over 60 something thousand in it and will have more information from [place] ( LG)  once he sends her proof of this amount of money. She stated that she will then have to cancel patient medicaid application because she will need to do a spend down. Musu stated that she would get back with CSW tomorrow about next steps regarding placement ( SNF VS ALF) since FL2 and other necessary documents will need to be redone and completed. CSW will continue to follow.

## 2024-09-21 NOTE — ED Notes (Signed)
 Patients IVC paperwork has been renewed at this time.

## 2024-09-22 DIAGNOSIS — I10 Essential (primary) hypertension: Secondary | ICD-10-CM | POA: Diagnosis not present

## 2024-09-22 DIAGNOSIS — E039 Hypothyroidism, unspecified: Secondary | ICD-10-CM | POA: Diagnosis not present

## 2024-09-22 DIAGNOSIS — Z853 Personal history of malignant neoplasm of breast: Secondary | ICD-10-CM | POA: Diagnosis not present

## 2024-09-22 DIAGNOSIS — Z79899 Other long term (current) drug therapy: Secondary | ICD-10-CM | POA: Diagnosis not present

## 2024-09-22 NOTE — ED Notes (Addendum)
 Pt resting, nad. Sitter at bedside. Dinner tray given

## 2024-09-22 NOTE — ED Provider Notes (Signed)
  Physical Exam  BP (!) 142/71 (BP Location: Left Arm)   Pulse 66   Temp 98.2 F (36.8 C) (Oral)   Resp 15   Ht 5' 6 (1.676 m)   Wt 71.3 kg   SpO2 100%   BMI 25.37 kg/m   Physical Exam  Procedures  Procedures  ED Course / MDM    Medical Decision Making Amount and/or Complexity of Data Reviewed Labs: ordered.  Risk OTC drugs. Prescription drug management.   Still pending nursing home placement.  No new issues overnight.       Patsey Lot, MD 09/22/24 1544

## 2024-09-22 NOTE — ED Notes (Signed)
Pt resting. Nad.  °

## 2024-09-22 NOTE — ED Notes (Signed)
 Informed TTS Consult to be done at 1400.

## 2024-09-22 NOTE — ED Notes (Addendum)
 CSW had a miss call from Musu with [facility]. CSW reached back out to Musu and no response. CSW is awaiting on next steps regarding placement since additional income has been found. CSW will continue to follow.   Addendum 2:28 pm   Musu reached back out to CSW stating that [facility] is willing to accept patient under private pay and that admissions contact information was provided to [facility]. However, Oneil called CSW around 2:47pm raising questions about [facility] asking for payment before patient can come and he emphasized that he is NOT over patient finances , just healthcare and were she discharges to. Oneil did share that the estate- Donnice Sharps just got involved with patient and the payment would not be handled until possibly another week and he is ready to get her moved. Oneil also shared frustration about patient finances and did not know why DSS did the medicaid application , when all this could have been avoided. CSW reached out to Musu to see what was going on and Musu shared that she is waiting on answers regarding the situation. CSW will continue to follow.

## 2024-09-22 NOTE — ED Notes (Signed)
 Patient family at bedside

## 2024-09-23 ENCOUNTER — Emergency Department (HOSPITAL_COMMUNITY)
Admission: EM | Admit: 2024-09-23 | Discharge: 2024-09-30 | Disposition: A | Discharge status: Home / Self Care | Attending: Emergency Medicine | Admitting: Emergency Medicine

## 2024-09-23 DIAGNOSIS — F03911 Unspecified dementia, unspecified severity, with agitation: Secondary | ICD-10-CM | POA: Insufficient documentation

## 2024-09-23 DIAGNOSIS — Z743 Need for continuous supervision: Secondary | ICD-10-CM | POA: Diagnosis not present

## 2024-09-23 DIAGNOSIS — R410 Disorientation, unspecified: Secondary | ICD-10-CM | POA: Diagnosis not present

## 2024-09-23 DIAGNOSIS — Z79899 Other long term (current) drug therapy: Secondary | ICD-10-CM | POA: Diagnosis not present

## 2024-09-23 DIAGNOSIS — I10 Essential (primary) hypertension: Secondary | ICD-10-CM | POA: Diagnosis not present

## 2024-09-23 DIAGNOSIS — R45851 Suicidal ideations: Secondary | ICD-10-CM | POA: Insufficient documentation

## 2024-09-23 DIAGNOSIS — E039 Hypothyroidism, unspecified: Secondary | ICD-10-CM | POA: Diagnosis not present

## 2024-09-23 DIAGNOSIS — Z853 Personal history of malignant neoplasm of breast: Secondary | ICD-10-CM | POA: Diagnosis not present

## 2024-09-23 MED ORDER — LISINOPRIL 10 MG PO TABS
20.0000 mg | ORAL_TABLET | Freq: Every day | ORAL | Status: DC
  Administered 2024-09-24 – 2024-09-30 (×7): 20 mg via ORAL
  Filled 2024-09-23 (×7): qty 2

## 2024-09-23 MED ORDER — MEMANTINE HCL 10 MG PO TABS
5.0000 mg | ORAL_TABLET | Freq: Two times a day (BID) | ORAL | Status: DC
  Administered 2024-09-24 – 2024-09-30 (×13): 5 mg via ORAL
  Filled 2024-09-23 (×14): qty 1

## 2024-09-23 MED ORDER — SIMVASTATIN 10 MG PO TABS
20.0000 mg | ORAL_TABLET | Freq: Every day | ORAL | Status: DC
  Administered 2024-09-24 – 2024-09-29 (×6): 20 mg via ORAL
  Filled 2024-09-23 (×6): qty 2

## 2024-09-23 MED ORDER — QUETIAPINE FUMARATE 25 MG PO TABS
25.0000 mg | ORAL_TABLET | Freq: Every day | ORAL | Status: DC
  Administered 2024-09-24 – 2024-09-29 (×6): 25 mg via ORAL
  Filled 2024-09-23 (×6): qty 1

## 2024-09-23 MED ORDER — ESCITALOPRAM OXALATE 10 MG PO TABS
10.0000 mg | ORAL_TABLET | Freq: Every day | ORAL | Status: DC
  Administered 2024-09-24 – 2024-09-30 (×7): 10 mg via ORAL
  Filled 2024-09-23 (×7): qty 1

## 2024-09-23 MED ORDER — MAGNESIUM OXIDE -MG SUPPLEMENT 400 (240 MG) MG PO TABS
200.0000 mg | ORAL_TABLET | Freq: Every day | ORAL | Status: DC
  Administered 2024-09-24 – 2024-09-30 (×7): 200 mg via ORAL
  Filled 2024-09-23 (×7): qty 1

## 2024-09-23 MED ORDER — LEVOTHYROXINE SODIUM 50 MCG PO TABS
75.0000 ug | ORAL_TABLET | Freq: Every day | ORAL | Status: DC
  Administered 2024-09-24 – 2024-09-30 (×7): 75 ug via ORAL
  Filled 2024-09-23 (×7): qty 2

## 2024-09-23 MED ORDER — ANASTROZOLE 1 MG PO TABS
1.0000 mg | ORAL_TABLET | Freq: Every day | ORAL | Status: DC
  Administered 2024-09-24 – 2024-09-30 (×7): 1 mg via ORAL
  Filled 2024-09-23 (×7): qty 1

## 2024-09-23 NOTE — ED Notes (Signed)
 5 belongings bags was returned to the pt.   [facility] has arrived to pick pt up. ED tech went with pt for transport to help with keeping the pt calm from dementia.

## 2024-09-23 NOTE — ED Notes (Signed)
 [facility] called CSW and shared that he was sitting with Oneil ( LG) and that Oneil went ahead to pay for patient to DC to [facility]. CSW updated nursing staff about DC plans and to get her prepared to leave. Spoke with Almarie who updated nurse and MD. Patient room number is 500-B and report number is [id number]. CSW signing off.

## 2024-09-23 NOTE — ED Notes (Signed)
 Contacted [facility] to give report: 1st time, called from hospital, put on hold - rang till all ringing stopped. Called 2nd time, put on hold again. 3rd time, called from personal cell - they answered and put on hold again, said she was going to page the 500 area. Still awaiting an answer after being on hold for 9 minutes.

## 2024-09-23 NOTE — ED Notes (Signed)
 Pt's Christine Cantrell called for an update at this time. Christine Cantrell was notified that the pt does have a place at [facility].

## 2024-09-23 NOTE — ED Provider Notes (Signed)
 [place] EMERGENCY DEPARTMENT AT [facility] Provider Note   CSN: [id number] Arrival date & time: 09/23/24  1728     Patient presents with: placement   Christine Cantrell is a 78 y.o. female.   HPI  Patient denies all complaints.  No chest pain shortness of breath.  No nausea vomit diarrhea. She is not sure  why she is back here in the ED.  Patient presents  back from [facility] at the request of legal guardian.  Was sent there today.  Previous medical history reviewed : Patient has been accepted to [facility].  Hide left ear from the ED earlier today.  She had been seen here for multiple days in a row because of dementia and placement.     Prior to Admission medications   Medication Sig Start Date End Date Taking? Authorizing Provider  anastrozole  (ARIMIDEX ) 1 MG tablet TAKE 1 TABLET BY MOUTH EVERY DAY 07/28/24   Odean Potts, MD  Coenzyme Q10-Vitamin E  (QUNOL ULTRA COQ10 PO) Take 1 capsule by mouth daily after lunch.     [provider]  Anselm Oil 500 MG CAPS Take 500 mg by mouth daily after lunch.     [provider]  levothyroxine  (SYNTHROID ) 75 MCG tablet Take 1 tablet (75 mcg total) by mouth daily before breakfast. 01/12/24   Jolinda Norene HERO, DO  lisinopril -hydrochlorothiazide  (ZESTORETIC ) 20-12.5 MG tablet Take 1 tablet by mouth daily. 01/12/24   Jolinda Norene HERO, DO  Magnesium  250 MG TABS Take 250 mg by mouth daily after lunch.     [provider]  memantine  (NAMENDA ) 5 MG tablet Take 1 tablet (5 mg total) by mouth 2 (two) times daily. For memory 01/12/24   Jolinda Norene HERO, DO  Multiple Vitamin (MULTIVITAMIN WITH MINERALS) TABS tablet Take 1 tablet by mouth daily after lunch. Women's One A Day Multivitamin     [provider]  simvastatin  (ZOCOR ) 20 MG tablet Take 1 tablet (20 mg total) by mouth daily. 01/12/24   Jolinda Norene HERO, DO    Allergies: Patient has no known allergies.    Review of Systems   Constitutional:  Negative for chills and fever.  HENT:  Negative for ear pain and sore throat.   Eyes:  Negative for pain and visual disturbance.  Respiratory:  Negative for cough and shortness of breath.   Cardiovascular:  Negative for chest pain and palpitations.  Gastrointestinal:  Negative for abdominal pain and vomiting.  Genitourinary:  Negative for dysuria and hematuria.  Musculoskeletal:  Negative for arthralgias and back pain.  Skin:  Negative for color change and rash.  Neurological:  Negative for seizures and syncope.  All other systems reviewed and are negative.   Updated Vital Signs BP 138/62 (BP Location: Right Arm)   Pulse 75   Temp 98.8 F (37.1 C) (Oral)   Resp 19   Ht 5' 6 (1.676 m)   Wt 71.3 kg   SpO2 97%   BMI 25.37 kg/m   Physical Exam Vitals and nursing note reviewed.  Constitutional:      General: She is not in acute distress.    Appearance: She is well-developed.  HENT:     Head: Normocephalic and atraumatic.  Eyes:     Conjunctiva/sclera: Conjunctivae normal.  Cardiovascular:     Rate and Rhythm: Normal rate and regular rhythm.     Heart sounds: No murmur heard. Pulmonary:     Effort: Pulmonary effort is normal. No respiratory distress.  Breath sounds: Normal breath sounds.  Abdominal:     Palpations: Abdomen is soft.     Tenderness: There is no abdominal tenderness.  Musculoskeletal:        General: No swelling.     Cervical back: Neck supple.  Skin:    General: Skin is warm and dry.     Capillary Refill: Capillary refill takes less than 2 seconds.  Neurological:     Mental Status: She is alert.  Psychiatric:        Mood and Affect: Mood normal.     (all labs ordered are listed, but only abnormal results are displayed) Labs Reviewed - No data to display  EKG: None  Radiology: No results found.   Procedures   Medications Ordered in the ED  anastrozole  (ARIMIDEX ) tablet 1 mg (has no administration in time range)   levothyroxine  (SYNTHROID ) tablet 75 mcg (has no administration in time range)  lisinopril  (ZESTRIL ) tablet 20 mg (has no administration in time range)  magnesium  oxide (MAG-OX) tablet 200 mg (has no administration in time range)  memantine  (NAMENDA ) tablet 5 mg (5 mg Oral Patient Refused/Not Given 09/23/24 2157)  simvastatin  (ZOCOR ) tablet 20 mg (has no administration in time range)  escitalopram  (LEXAPRO ) tablet 10 mg (has no administration in time range)  QUEtiapine  (SEROQUEL ) tablet 25 mg (25 mg Oral Patient Refused/Not Given 09/23/24 2157)                                    Medical Decision Making Risk OTC drugs. Prescription drug management.    Patient denies all complaints.  No chest pain shortness of breath.  No nausea vomit diarrhea. She is not sure  why she is back here in the ED.  Patient presents  back from [facility] at the request of legal guardian.  Was sent there today.  Previous medical history reviewed : Patient has been accepted to [facility].  Hide left ear from the ED earlier today.  She had been seen here for multiple days in a row because of dementia and placement.     Patient just recently medically clear.  No complaints and hemodynamically stable here.  No indication for repeat labs.  Reviewed note from September 10 when she first came here to the ED.  She initial had IVC placed because of suicidal comments.  Medical workup unremarkable.  History of Dementia.  Home meds have been ordered.            Final diagnoses:  Dementia with agitation, unspecified dementia severity, unspecified dementia type [facility])    ED Discharge Orders     None          Simon Lavonia SAILOR, MD 09/23/24 [phone]

## 2024-09-23 NOTE — ED Notes (Signed)
 Report given to Courtney of [place] at this time. No questions, comments, or concerns after report was given.

## 2024-09-23 NOTE — ED Notes (Addendum)
 1800 - Round check is complete at this time. Pt denies needing anything like food, drink or bathroom. Pt has no s/s of distress.  1900 - Round check is complete at this time. Pt denies needing anything like food, drink or bathroom. Pt has no s/s of distress. Pt is quietly watching TV. Pt's bed was moved closer to the TV.   1950 - Round check is complete at this time. Pt denies needing anything like food, drink or bathroom. Pt has no s/s of distress. Pt is talking to her friend, Roselie, via phone. Pt is calm when speaking to [place].   2100 - Round check is complete at this time. Pt denies needing anything like food, drink or bathroom. Pt has no s/s of distress. Pt is laying down, getting ready for bed.   2200 - Round check is complete at this time. Pt is resting with no s/s of distress.

## 2024-09-23 NOTE — ED Triage Notes (Addendum)
 Pt is back from [facility]. Pending to go back until funding is cleared.   Pt has 3 bag of belongings

## 2024-09-23 NOTE — ED Provider Notes (Addendum)
 Emergency Medicine Observation Re-evaluation Note  Christine Cantrell is a 78 y.o. female, seen on rounds today.  Pt initially presented to the ED for complaints of dementia, placement Currently, the patient is resting.  Physical Exam  BP 133/74 (BP Location: Left Arm)   Pulse 75   Temp 98.9 F (37.2 C) (Oral)   Resp 15   Ht 5' 6 (1.676 m)   Wt 71.3 kg   SpO2 98%   BMI 25.37 kg/m  Physical Exam General: NAD Cardiac: regular rate  Lungs: equal chest rise Psych: calm  ED Course / MDM  EKG:EKG Interpretation Date/Time:  Monday September 19 2024 18:57:18 EDT Ventricular Rate:  85 PR Interval:  160 QRS Duration:  140 QT Interval:  418 QTC Calculation: 497 R Axis:   -16  Text Interpretation: Normal sinus rhythm Left bundle branch block Abnormal ECG When compared with ECG of 07-Sep-2024 19:52, Previous ECG has undetermined rhythm, needs review Questionable change in QRS axis T wave inversion no longer evident in Inferior leads No significant change was found Confirmed by Trine Likes [id number]) on 09/20/2024 3:52:30 PM  I have reviewed the labs performed to date as well as medications administered while in observation.  Recent changes in the last 24 hours include none.  Plan  Current plan is for placement per sw.    Francesca Elsie CROME, MD 09/23/24 [phone]  Patient accepted to [facility]. IVC rescinded and will be discharged to bed there    Francesca Elsie CROME, MD 09/23/24 1325

## 2024-09-23 NOTE — ED Notes (Signed)
 Resinded IVC Paperwork

## 2024-09-23 NOTE — ED Notes (Incomplete)
 CSW was notified by staff that LG shared that JC was only holding patient bed and that they needed to get payment. CSW spoke with [facility] while LG was with him and they said ,  YOU CAN GET MS. [place] DC TODAY. CSW was confused because of yesterday , LG stated that she needed payment and did not want to pay out of his funds,

## 2024-09-24 DIAGNOSIS — I10 Essential (primary) hypertension: Secondary | ICD-10-CM | POA: Diagnosis not present

## 2024-09-24 DIAGNOSIS — Z79899 Other long term (current) drug therapy: Secondary | ICD-10-CM | POA: Diagnosis not present

## 2024-09-24 DIAGNOSIS — Z853 Personal history of malignant neoplasm of breast: Secondary | ICD-10-CM | POA: Diagnosis not present

## 2024-09-24 DIAGNOSIS — E039 Hypothyroidism, unspecified: Secondary | ICD-10-CM | POA: Diagnosis not present

## 2024-09-24 NOTE — ED Notes (Signed)
 Phone provided to the pt

## 2024-09-24 NOTE — ED Notes (Signed)
 TOC aware of pts return from [facility]. At this time pt cannot return until funding has cleared. No further updates will be available this weekend. TOC to follow up Monday 9/29.

## 2024-09-24 NOTE — ED Notes (Signed)
 Pt had visitors; no negative behavioral changes.

## 2024-09-24 NOTE — ED Provider Notes (Signed)
 Emergency Medicine Observation Re-evaluation Note  Christine Cantrell is a 78 y.o. female, seen on rounds today.  Pt initially presented to the ED for complaints of placement Currently, the patient is sleeping.  Physical Exam  BP 138/62 (BP Location: Right Arm)   Pulse 75   Temp 98.8 F (37.1 C) (Oral)   Resp 19   Ht 5' 6 (1.676 m)   Wt 71.3 kg   SpO2 97%   BMI 25.37 kg/m  Physical Exam General: sleeping Cardiac: well-perfused Lungs: no resp distress Psych: sleeping  ED Course / MDM  EKG:   I have reviewed the labs performed to date as well as medications administered while in observation.  Recent changes in the last 24 hours include presented back from [facility].  Plan  Current plan is for placement.    Franklyn Sid SAILOR, MD 09/24/24 [phone]

## 2024-09-24 NOTE — ED Notes (Signed)
 Verified the pt is a TOC hold and does not have active IVC paperwork or order.

## 2024-09-24 NOTE — ED Notes (Signed)
 Pt in view of sitter

## 2024-09-25 DIAGNOSIS — E039 Hypothyroidism, unspecified: Secondary | ICD-10-CM | POA: Diagnosis not present

## 2024-09-25 DIAGNOSIS — Z79899 Other long term (current) drug therapy: Secondary | ICD-10-CM | POA: Diagnosis not present

## 2024-09-25 DIAGNOSIS — Z853 Personal history of malignant neoplasm of breast: Secondary | ICD-10-CM | POA: Diagnosis not present

## 2024-09-25 DIAGNOSIS — I10 Essential (primary) hypertension: Secondary | ICD-10-CM | POA: Diagnosis not present

## 2024-09-25 NOTE — ED Notes (Addendum)
 1100 - Round check is complete at this time. Pt is resting with no s/s of distress.   1200 - Round check is complete at this time. Pt is resting with no s/s of distress.   1300 - Round check is complete at this time. Pt denies needing anything like food, drink or bathroom. Pt has no s/s of distress. Pt is looking at paperwork she was given.  1400 - Round check is complete at this time. Pt denies needing anything like food, drink or bathroom. Pt has no s/s of distress. Pt is sitting beside the sitter and speaking with the sitter in a calmly manner.

## 2024-09-25 NOTE — ED Notes (Signed)
Pt's family is at bedside

## 2024-09-25 NOTE — ED Notes (Signed)
 Alert, NAD, calm, pleasant. Reports enough sleep, enough to eat. Updated.

## 2024-09-25 NOTE — ED Notes (Signed)
 Pt up to bathroom with sitter. Calm and cooperative at this time

## 2024-09-26 DIAGNOSIS — Z79899 Other long term (current) drug therapy: Secondary | ICD-10-CM | POA: Diagnosis not present

## 2024-09-26 DIAGNOSIS — I10 Essential (primary) hypertension: Secondary | ICD-10-CM | POA: Diagnosis not present

## 2024-09-26 DIAGNOSIS — Z853 Personal history of malignant neoplasm of breast: Secondary | ICD-10-CM | POA: Diagnosis not present

## 2024-09-26 DIAGNOSIS — E039 Hypothyroidism, unspecified: Secondary | ICD-10-CM | POA: Diagnosis not present

## 2024-09-26 MED ORDER — TUBERCULIN PPD 5 UNIT/0.1ML ID SOLN
5.0000 [IU] | INTRADERMAL | Status: AC
  Administered 2024-09-26: 5 [IU] via INTRADERMAL
  Filled 2024-09-26: qty 0.1

## 2024-09-26 NOTE — ED Notes (Signed)
 Pt in bed, pt calm and cooperative, pt oriented to person and place, pt doesn't know day of the week, pt asking what she is waiting for. Explained plan of care.  Pt states that she would like to go home because she thinks that her family is trying to take her house. Pt ambulatory to bathroom with steady gait.

## 2024-09-26 NOTE — ED Notes (Addendum)
 CSW was notified by staff  on Friday that Christine Cantrell shared that Christine Cantrell was only holding patient bed after Christine Cantrell and Christine Cantrell stated that she could DC today.  CSW spoke with Christine Cantrell while Christine Cantrell was present and they said ,  YOU CAN GET [name] DC TODAY. CSW was confused because when speaking with Christine Cantrell, he shared that he have not heard anything back from the estate Christine Cantrell regarding her funds. Furthermore, Christine Cantrell called and messaged CSW numerous time - rushing to get AVS and getting transportation arranged, for DC. Christine Cantrell questioned transportation, making a comment,  since you guys want her DC , you will cover transportation . Therefore, it was INDICATED that things have been taken care and Christine Cantrell nursing staff, was  antsy waiting for the AVS. Long story short, no money was paid , and although the room number and number to call report was made, it was INDICATED to CSW that Christine Cantrell and Christine Cantrell had made an  agreement and paperwork was signed. Transportation was already arranged and when patient was on her way to facility- the Christine Cantrell and Christine Cantrell were NOT on the same page and Christine Cantrell stated that he called the hospital earlier and spoke with someone regarding  working on getting  patient out. CSW did share after the fact with Christine Cantrell that calling , speaking things prematurely has caused lots of confusion , especially since money was not released yet to be approved. CSW updated staff and patient was brought back- Christine Cantrell did reach out to CSW late Friday evening stating that they tried to work something out with Christine Cantrell but he demanded patient get sent back.   Addendum 9/29 - 11:14 AM  Christine Cantrell sent CSW a message AGAIN stating that he was working to get patient out AGAIN today (hopefully) to go to [facility] in [place]. However, CSW knows the process and is aware that today would not happen due to them needing documents and a TB test- which reading will takes up to 48-Hours. CSW will work on paperwork and asked MD/ nurse for TB test.   Addendum 1:20 pm   [name] requested documents were  sent back- pending PPD . It was confirmed that patient will go to there [facility] side.Per Christine Cantrell -A nurse will come out tomorrow to assess.

## 2024-09-26 NOTE — ED Notes (Signed)
 Pt sitting in room, meal given, pt eating, sitter observing pt.

## 2024-09-26 NOTE — ED Notes (Signed)
 Pt sleeping at this time.

## 2024-09-26 NOTE — ED Provider Notes (Signed)
 Emergency Medicine Observation Re-evaluation Note  Christine Cantrell is a 78 y.o. female, seen on rounds today.  Pt initially presented to the ED for complaints of placement Currently, the patient is asleep.  Physical Exam  BP (!) 153/81   Pulse 82   Temp 98.3 F (36.8 C) (Oral)   Resp 20   Ht 5' 6 (1.676 m)   Wt 71.3 kg   SpO2 95%   BMI 25.37 kg/m  Physical Exam General: asleep Cardiac: asleep Lungs: asleep Psych: asleep  ED Course / MDM  EKG:   I have reviewed the labs performed to date as well as medications administered while in observation.  No recent changes in the last 24 hours.  Plan  Current plan is for placement.    Freddi Hamilton, MD 09/26/24 [phone]

## 2024-09-27 DIAGNOSIS — Z853 Personal history of malignant neoplasm of breast: Secondary | ICD-10-CM | POA: Diagnosis not present

## 2024-09-27 DIAGNOSIS — I10 Essential (primary) hypertension: Secondary | ICD-10-CM | POA: Diagnosis not present

## 2024-09-27 DIAGNOSIS — Z79899 Other long term (current) drug therapy: Secondary | ICD-10-CM | POA: Diagnosis not present

## 2024-09-27 DIAGNOSIS — E039 Hypothyroidism, unspecified: Secondary | ICD-10-CM | POA: Diagnosis not present

## 2024-09-27 NOTE — ED Provider Notes (Signed)
 Emergency Medicine Observation Re-evaluation Note  Christine Cantrell is a 78 y.o. female, seen on rounds today.  Pt initially presented to the ED for complaints of placement Currently, the patient is awaiting placement  Physical Exam  BP (!) 153/83 (BP Location: Left Arm)   Pulse 83   Temp 98.4 F (36.9 C) (Oral)   Resp 18   Ht 5' 6 (1.676 m)   Wt 71.3 kg   SpO2 99%   BMI 25.37 kg/m  Physical Exam Sleeping and in no acute distress  ED Course / MDM  EKG:   I have reviewed the labs performed to date as well as medications administered while in observation.  Recent changes in the last 24 hours include none.  Plan  Current plan is for placement.    Suzette Pac, MD 09/27/24 [phone]

## 2024-09-28 DIAGNOSIS — I10 Essential (primary) hypertension: Secondary | ICD-10-CM | POA: Diagnosis not present

## 2024-09-28 DIAGNOSIS — Z79899 Other long term (current) drug therapy: Secondary | ICD-10-CM | POA: Diagnosis not present

## 2024-09-28 DIAGNOSIS — E039 Hypothyroidism, unspecified: Secondary | ICD-10-CM | POA: Diagnosis not present

## 2024-09-28 DIAGNOSIS — Z853 Personal history of malignant neoplasm of breast: Secondary | ICD-10-CM | POA: Diagnosis not present

## 2024-09-28 NOTE — ED Provider Notes (Signed)
 Emergency Medicine Observation Re-evaluation Note  Christine Cantrell is a 78 y.o. female, seen on rounds today.  Pt initially presented to the ED for complaints of placement Currently, the patient is awaiting placement.  Physical Exam  BP (!) 150/76 (BP Location: Left Arm)   Pulse 74   Temp 98.4 F (36.9 C) (Oral)   Resp 17   Ht 5' 6 (1.676 m)   Wt 71.3 kg   SpO2 100%   BMI 25.37 kg/m  Physical Exam Resting and in no acute distress  ED Course / MDM  EKG:   I have reviewed the labs performed to date as well as medications administered while in observation.  Recent changes in the last 24 hours include none.  Plan  Current plan is for placement.    Suzette Pac, MD 09/28/24 [phone]

## 2024-09-28 NOTE — ED Notes (Addendum)
 CSW spoke with patient at bedside and shared with her that Fredick wanted to speak with her again ask additional questions via phone with Emergency planning/management officer. Patient agreed to it and call was held. [name] asked CSW question about if patient was appropriate for [facility]. CSW reviewed patient notes and shared with Charmaine that patient has not tried to actively leave the hospital, but have called others to pick her up. Charmaine and her director then asked patient questions and patient answered them well. They asked her if she would try to leave and if she understood why she was coming. Patient stated,  I have no choice, I know I have to stay, and I will not try to leave. Afterwards, Charmaine shared that she just needed PPD reading and additional notes. Also shared that transportation has been arrange for tomorrow- driver should arrive @ 9AM. Pending PPD reading will be sent over once ready to be read. CSW will continue to follow.  Addendum 1:43 pm   CSW received a message from [place] that [place] with [facility] clinical team declined patient although he has already signed necessary paperwork and got her room together. CSW called Charmaine and she shared the concerns that her clinical team had - stating that patient functions to high for their Memory Care Unit and would likely try to escape by watching a staff member put a code in to get out or confuse staff with thinking she is a family member. Charmaine stated that the decision was out of her hands  and they cannot move forwards. CSW spoke with Oneil and Oneil too was disappointed due to working hard to get patient placed somewhere. Oneil adamant about patient not being able to return home or come to his home. CSW has called other ALF [facility] and will continue to follow.

## 2024-09-29 DIAGNOSIS — E039 Hypothyroidism, unspecified: Secondary | ICD-10-CM | POA: Diagnosis not present

## 2024-09-29 DIAGNOSIS — Z79899 Other long term (current) drug therapy: Secondary | ICD-10-CM | POA: Diagnosis not present

## 2024-09-29 DIAGNOSIS — I10 Essential (primary) hypertension: Secondary | ICD-10-CM | POA: Diagnosis not present

## 2024-09-29 DIAGNOSIS — Z853 Personal history of malignant neoplasm of breast: Secondary | ICD-10-CM | POA: Diagnosis not present

## 2024-09-29 NOTE — ED Notes (Signed)
 Mark, (nephew) (current LG) called with specific questions on the patients medication list and the specifics of the pt taking Seroquel .

## 2024-09-29 NOTE — ED Provider Notes (Signed)
 Emergency Medicine Observation Re-evaluation Note  Christine Cantrell is a 78 y.o. female, seen on rounds today.  Pt initially presented to the ED for complaints of placement Currently, the patient is awaiting placement.  Physical Exam  BP (!) 187/83 (BP Location: Right Arm)   Pulse 69   Temp 98.2 F (36.8 C) (Oral)   Resp 18   Ht 5' 6 (1.676 m)   Wt 71.3 kg   SpO2 100%   BMI 25.37 kg/m  Physical Exam Awake and in no acute distress  ED Course / MDM  EKG:   I have reviewed the labs performed to date as well as medications administered while in observation.  Recent changes in the last 24 hours include none.  Plan  Current plan is for placement.    Suzette Pac, MD 09/29/24 [phone]

## 2024-09-29 NOTE — ED Notes (Addendum)
 CSW spoke with Oneil yesterday afternoon about barriers with any ALF Memory since [name] reported patient functions too high for that level. CSW suggested the  ALF side , Oneil declined saying that he did not want patient to leave facility. However, [facility] came and assess patient yesterday afternoon- Hospital doctor spoke with Oneil and Oneil stated that [facility] [facility] was too far for family. CSW circled back to [facility] and the administrator declined.   Mark sent CSW a message this morning (10/2) stating that someone from [facility] in [place] would be coming out this morning to assess patient. CSW will inform patient nurse.   Addendum 10:25  Verneita and Izetta came to assess patient - Izetta shared that patient scored high on their assessment- was a 26/30. [facility] declined to accept patient and shared concerns about a judge deeming patient incompetent. CSW shared with Verneita and Izetta both that patient has her moments and yesterday afternoon patient was not able to tell the year or month, just the season and president. Patient did expressed to Izetta and Verneita that she did not want Oneil being her LG. CSW told Verneita and Izetta  them aware about APS getting involved , family stepped up , and we are here now trying to get her placed. The facility still did not feel comfortable accepting patient since she scored high and do not believe she is incompetent. After speaking with them , CSW spoke with Oneil numerous times  throughout the day about the facility who came to visit and ongoing barriers with these ALF facilities. We also discussed, places he went to visit , and two other facilities I have sent patient information too. Oneil went to visit [address] and the two other facilities are [address] and [facility] in [place]. CSW will continue to follow.

## 2024-09-29 NOTE — ED Notes (Addendum)
 LG Mark called and stated he believes they have found placement for the patient. Oneil stated this is a Nurse, children's care and he believes it will be a perfect fit. Facilitators from [facility] are scheduled to visit tomorrow to interview the patient around 0830, MARK HAS REQUESTED THE PATIENT NOT BE TOLD THIS INFORMATION

## 2024-09-29 NOTE — ED Notes (Signed)
 Pt ambulated to restroom.

## 2024-09-29 NOTE — ED Notes (Signed)
 Pt's best friend Rosslyn) called and spoke with her.

## 2024-09-30 DIAGNOSIS — E039 Hypothyroidism, unspecified: Secondary | ICD-10-CM | POA: Diagnosis not present

## 2024-09-30 DIAGNOSIS — Z79899 Other long term (current) drug therapy: Secondary | ICD-10-CM | POA: Diagnosis not present

## 2024-09-30 DIAGNOSIS — Z853 Personal history of malignant neoplasm of breast: Secondary | ICD-10-CM | POA: Diagnosis not present

## 2024-09-30 DIAGNOSIS — I10 Essential (primary) hypertension: Secondary | ICD-10-CM | POA: Diagnosis not present

## 2024-09-30 MED ORDER — ESCITALOPRAM OXALATE 10 MG PO TABS: 10.0000 mg | ORAL_TABLET | Freq: Every day | ORAL | 0 refills | Status: AC

## 2024-09-30 MED ORDER — QUETIAPINE FUMARATE 25 MG PO TABS: 25.0000 mg | ORAL_TABLET | Freq: Every day | ORAL | 0 refills | Status: AC

## 2024-09-30 MED ORDER — SIMVASTATIN 80 MG PO TABS: 80.0000 mg | ORAL_TABLET | Freq: Every day | ORAL | 0 refills | Status: AC

## 2024-09-30 MED ORDER — MEMANTINE HCL 5 MG PO TABS: 5.0000 mg | ORAL_TABLET | Freq: Two times a day (BID) | ORAL | 0 refills | Status: AC

## 2024-09-30 MED ORDER — ANASTROZOLE 1 MG PO TABS: 1.0000 mg | ORAL_TABLET | Freq: Every day | ORAL | 0 refills | Status: AC

## 2024-09-30 MED ORDER — LEVOTHYROXINE SODIUM 75 MCG PO TABS: 75.0000 ug | ORAL_TABLET | Freq: Every day | ORAL | 0 refills | Status: AC

## 2024-09-30 MED ORDER — LISINOPRIL 20 MG PO TABS: 20.0000 mg | ORAL_TABLET | Freq: Every day | ORAL | 0 refills | Status: AC

## 2024-09-30 NOTE — ED Notes (Signed)
 Verneita with [facility] confirmed being able to accept patient today. She did request that we send patient with a 3-day supply of her medications for the weekend since their pharmacy was already closed. Also asked if we could arrange transportation. CSW updated MD and nurse on what was need to get patient out the door. Mark LG is heading to facility to get patient room setup and arrange payment. CSW signing off.

## 2024-09-30 NOTE — ED Notes (Signed)
 Gaynell with [facility] was updated on reasoning behind not being able to send patient with 3-day of all meds. However, MD was able to send meds to [facility] requested Pharmacy for him to pick up and take to facility for patient.  CSW spoke with Nurse Rock who assisted with getting request done and getting patient ready for DC once MD sent scripts and completed AVS. CSW signing off.

## 2024-09-30 NOTE — ED Provider Notes (Addendum)
 Emergency Medicine Observation Re-evaluation Note  Anarie Kalish is a 78 y.o. female, seen on rounds today.  Pt initially presented to the ED for complaints of placement Currently, the patient is sleeping.  Physical Exam  BP (!) 187/83 (BP Location: Right Arm)   Pulse 69   Temp 98.2 F (36.8 C) (Oral)   Resp 18   Ht 5' 6 (1.676 m)   Wt 71.3 kg   SpO2 100%   BMI 25.37 kg/m  Physical Exam General: No acute distress Cardiac: Well-perfused Lungs: Nonlabored  ED Course / MDM  EKG:   I have reviewed the labs performed to date as well as medications administered while in observation.  Recent changes in the last 24 hours include social work working on placement.  Plan  Current plan is for placement.    [name], MD 09/30/24 0708   2:50 PM.  Social work has arranged for patient to be discharged to facility.  She asked if I would send prescriptions for her home medications for 3 days worth to the pharmacy which I have done to the best of my ability   [name], MD 09/30/24 1447

## 2024-09-30 NOTE — ED Notes (Signed)
 Spoke with Oneil regarding pt valuables (purse and jewelry) and he stated that deputies did not allow pt to bring those items to the hospital when she first came, and that he in fact does have those items in his possession.

## 2024-09-30 NOTE — Progress Notes (Signed)
 PPD read to left forearm at <0cm; no redness noted; negative results

## 2024-09-30 NOTE — ED Notes (Addendum)
 Safe transport called to transport patient to [facility]. Nurse aware.

## 2024-09-30 NOTE — ED Notes (Addendum)
 Patient fax went through yesterday afternoon to [facility] with [facility]. Mark ( LG) sent this Clinical research associate a message stating that someone from the facility would be coming to assess patient this morning. This Arboriculturist. Will continue to follow.  Addendum 8:54 AM   Oneil ( LG) called writer stating that facility has already came out to assess patient and has accepted her. Oneil shared that he had a meeting , and will go over to sign paperwork / get her room together. Oneil is anticipating getting her DC to facility today as long as everything checks out and facility can accept.

## 2024-09-30 NOTE — ED Notes (Signed)
 Mark requesting nurse to call him when pt gets on the transportation bus.
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
## 2024-10-05 DIAGNOSIS — C50919 Malignant neoplasm of unspecified site of unspecified female breast: Secondary | ICD-10-CM | POA: Diagnosis not present

## 2024-10-05 DIAGNOSIS — I10 Essential (primary) hypertension: Secondary | ICD-10-CM | POA: Diagnosis not present

## 2024-10-05 DIAGNOSIS — R451 Restlessness and agitation: Secondary | ICD-10-CM | POA: Diagnosis not present

## 2024-10-05 DIAGNOSIS — E039 Hypothyroidism, unspecified: Secondary | ICD-10-CM | POA: Diagnosis not present

## 2024-10-05 DIAGNOSIS — E785 Hyperlipidemia, unspecified: Secondary | ICD-10-CM | POA: Diagnosis not present

## 2024-10-05 DIAGNOSIS — G301 Alzheimer's disease with late onset: Secondary | ICD-10-CM | POA: Diagnosis not present

## 2024-10-06 DIAGNOSIS — E785 Hyperlipidemia, unspecified: Secondary | ICD-10-CM | POA: Diagnosis not present

## 2024-10-06 DIAGNOSIS — Z79899 Other long term (current) drug therapy: Secondary | ICD-10-CM | POA: Diagnosis not present

## 2024-10-06 DIAGNOSIS — E039 Hypothyroidism, unspecified: Secondary | ICD-10-CM | POA: Diagnosis not present

## 2024-10-06 DIAGNOSIS — I10 Essential (primary) hypertension: Secondary | ICD-10-CM | POA: Diagnosis not present

## 2024-10-20 ENCOUNTER — Emergency Department (HOSPITAL_COMMUNITY)
Admission: EM | Admit: 2024-10-20 | Discharge: 2024-10-20 | Disposition: A | Discharge status: Home / Self Care | Attending: Emergency Medicine | Admitting: Emergency Medicine

## 2024-10-20 DIAGNOSIS — S00511A Abrasion of lip, initial encounter: Secondary | ICD-10-CM | POA: Insufficient documentation

## 2024-10-20 DIAGNOSIS — Z79890 Hormone replacement therapy (postmenopausal): Secondary | ICD-10-CM | POA: Insufficient documentation

## 2024-10-20 DIAGNOSIS — S0993XA Unspecified injury of face, initial encounter: Secondary | ICD-10-CM | POA: Diagnosis not present

## 2024-10-20 DIAGNOSIS — F0390 Unspecified dementia without behavioral disturbance: Secondary | ICD-10-CM | POA: Insufficient documentation

## 2024-10-20 DIAGNOSIS — Z79899 Other long term (current) drug therapy: Secondary | ICD-10-CM | POA: Insufficient documentation

## 2024-10-20 DIAGNOSIS — E039 Hypothyroidism, unspecified: Secondary | ICD-10-CM | POA: Diagnosis not present

## 2024-10-20 DIAGNOSIS — I10 Essential (primary) hypertension: Secondary | ICD-10-CM | POA: Insufficient documentation
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
## 2025-03-15 ENCOUNTER — Encounter: Admitting: Family Medicine

## 2025-08-16 ENCOUNTER — Ambulatory Visit: Admitting: Hematology and Oncology
